# Patient Record
Sex: Male | Born: 1940 | Race: White | Hispanic: No | Smoking: Never smoker
Health system: Southern US, Community
[De-identification: ages and names within clinical notes are randomized; demographics above are authoritative.]

## PROBLEM LIST (undated history)

## (undated) DIAGNOSIS — N182 Chronic kidney disease, stage 2 (mild): Secondary | ICD-10-CM

## (undated) DIAGNOSIS — R972 Elevated prostate specific antigen [PSA]: Secondary | ICD-10-CM

## (undated) DIAGNOSIS — J302 Other seasonal allergic rhinitis: Secondary | ICD-10-CM

## (undated) DIAGNOSIS — F329 Major depressive disorder, single episode, unspecified: Secondary | ICD-10-CM

## (undated) DIAGNOSIS — E785 Hyperlipidemia, unspecified: Secondary | ICD-10-CM

## (undated) DIAGNOSIS — I10 Essential (primary) hypertension: Secondary | ICD-10-CM

## (undated) DIAGNOSIS — I639 Cerebral infarction, unspecified: Secondary | ICD-10-CM

## (undated) DIAGNOSIS — K219 Gastro-esophageal reflux disease without esophagitis: Secondary | ICD-10-CM

## (undated) HISTORY — PX: NASAL SEPTUM SURGERY: SHX37

## (undated) HISTORY — DX: Cerebral infarction, unspecified: I63.9

## (undated) HISTORY — DX: Elevated prostate specific antigen (PSA): R97.20

## (undated) HISTORY — DX: Other seasonal allergic rhinitis: J30.2

## (undated) HISTORY — DX: Gastro-esophageal reflux disease without esophagitis: K21.9

## (undated) HISTORY — DX: Major depressive disorder, single episode, unspecified: F32.9

## (undated) HISTORY — DX: Chronic kidney disease, stage 2 (mild): N18.2

## (undated) HISTORY — DX: Hyperlipidemia, unspecified: E78.5

## (undated) HISTORY — PX: VASECTOMY: SHX75

## (undated) HISTORY — DX: Essential (primary) hypertension: I10

---

## 2001-03-27 ENCOUNTER — Ambulatory Visit (HOSPITAL_COMMUNITY): Admission: RE | Admit: 2001-03-27 | Discharge: 2001-03-27 | Payer: Self-pay | Admitting: Gastroenterology

## 2004-03-26 ENCOUNTER — Inpatient Hospital Stay (HOSPITAL_COMMUNITY): Admission: EM | Admit: 2004-03-26 | Discharge: 2004-04-10 | Payer: Self-pay | Admitting: Emergency Medicine

## 2004-03-26 ENCOUNTER — Encounter (INDEPENDENT_AMBULATORY_CARE_PROVIDER_SITE_OTHER): Payer: Self-pay | Admitting: Cardiology

## 2004-04-10 ENCOUNTER — Inpatient Hospital Stay
Admission: RE | Admit: 2004-04-10 | Discharge: 2004-04-17 | Payer: Self-pay | Admitting: Physical Medicine & Rehabilitation

## 2004-04-17 ENCOUNTER — Inpatient Hospital Stay (HOSPITAL_COMMUNITY)
Admission: RE | Admit: 2004-04-17 | Discharge: 2004-05-07 | Payer: Self-pay | Admitting: Physical Medicine & Rehabilitation

## 2004-05-07 ENCOUNTER — Ambulatory Visit: Payer: Self-pay | Admitting: Physical Medicine & Rehabilitation

## 2004-06-09 ENCOUNTER — Encounter
Admission: RE | Admit: 2004-06-09 | Discharge: 2004-09-07 | Payer: Self-pay | Admitting: Physical Medicine & Rehabilitation

## 2004-06-11 ENCOUNTER — Ambulatory Visit: Payer: Self-pay | Admitting: Physical Medicine & Rehabilitation

## 2004-06-16 ENCOUNTER — Encounter: Admission: RE | Admit: 2004-06-16 | Discharge: 2004-09-14 | Payer: Self-pay | Admitting: Sports Medicine

## 2004-06-24 ENCOUNTER — Ambulatory Visit (HOSPITAL_COMMUNITY): Admission: RE | Admit: 2004-06-24 | Discharge: 2004-06-24 | Payer: Self-pay | Admitting: Neurology

## 2004-09-15 ENCOUNTER — Encounter: Admission: RE | Admit: 2004-09-15 | Discharge: 2004-10-29 | Payer: Self-pay | Admitting: Sports Medicine

## 2004-10-15 ENCOUNTER — Ambulatory Visit: Payer: Self-pay | Admitting: Physical Medicine & Rehabilitation

## 2004-10-15 ENCOUNTER — Encounter
Admission: RE | Admit: 2004-10-15 | Discharge: 2005-01-13 | Payer: Self-pay | Admitting: Physical Medicine & Rehabilitation

## 2010-04-03 ENCOUNTER — Encounter
Admission: RE | Admit: 2010-04-03 | Discharge: 2010-06-01 | Payer: Self-pay | Source: Home / Self Care | Admitting: Internal Medicine

## 2010-09-27 ENCOUNTER — Encounter: Payer: Self-pay | Admitting: Physical Medicine & Rehabilitation

## 2010-09-27 ENCOUNTER — Encounter: Payer: Self-pay | Admitting: Neurology

## 2011-01-22 NOTE — Assessment & Plan Note (Signed)
DATE OF EVALUATION:  October 19, 2004.   MEDICAL RECORD NUMBER:  [id number].   DATE OF BIRTH:  September 20, 1940.   Mr. Grieder returns to the clinic today for followup evaluation.  His wife  reports that all outpatient physical therapy is due to stop next week.  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  He does ambulate 200 yards,  but does have occasional loss of balance and mild toe dragging on the right.   The patient is reporting no particular pain problems at this time.  He is  taking no tramadol and his wife is interested in possibly weaning him from  Neurontin.   The patient had his PEG tube removed during the last office visit.  He is  apparently eating well at this point and having no problems with any  consistency of food.  He still has some occasional bleeding from his penis  tip and the patient has been evaluated by Dr. Etta Grandchild, a local urologist.  Apparently there is a possibility of cystoscopy being done in the near  future for the recurrent problem.  He apparently was told he had a slightly  large prostate on manual testing.   MEDICATIONS:  1.  Plavix 75 mg daily.  2.  Foltx one tablet b.i.d.  3.  Neurontin 100 mg t.i.d.  4.  Aspirin 325 mg daily.  5.  Zoloft 100 mg daily.  6.  Tramadol 50 mg p.o. q.i.d. p.r.n.   PHYSICAL EXAMINATION:  A well-appearing, adult male in no acute discomfort.  Blood pressure 135/71 with a pulse of 82, respiratory rate 20 and O2  saturation 98% on room air.  The left upper extremity and left lower  extremity strength was 5/5 throughout.  The right upper extremity strength  was 4+/5 with normal reflexes and normal sensation.  The right lower  extremity strength was generally 4+/5 in hip flexion and knee extension and  3-/5 in ankle dorsiflexion.   IMPRESSION:  1.  Status post  left mid brain ischemic stroke.  2.  Hypertension.  3.  History of neuropathy.  4.  Depression.  5.  Severe dysphagia, improved.  6.  Severe dysarthria, improved.   In the office today, we have asked the patient to wean from the Neurontin  over the next 10-20 days, decreasing to 100 mg twice a day for 10 days and  then decreasing to 100 mg a day for 10 days and then stopping completely.  He is really not using any tramadol at all at this point.  He will continue  on the Plavix, Foltx, aspirin and Zoloft  medications as noted above.  Will plan on seeing the patient in followup in  approximately two months when we will probably discharge him to his home.      DC/MedQ  D:  10/19/2004 12:20:47  T:  10/19/2004 13:24:05  Job #:  [id number]

## 2011-01-22 NOTE — Consult Note (Signed)
Dennis, Graham NO.:  [id number]   MEDICAL RECORD NO.:  [id number]                   PATIENT TYPE:  INP   LOCATION:  3110                                 FACILITY:  [facility]   PHYSICIAN:  Bernette Redbird, M.D.                DATE OF BIRTH:  Apr 14, 1941   DATE OF CONSULTATION:  04/06/2004  DATE OF DISCHARGE:                                   CONSULTATION   The stroke service asked [place] to see this 70 year old gentleman for possible  PEG placement.   The patient was admitted to the hospital about 10 days ago with a CVA which  has left him with significant dysphagia and a fair amount of drowsiness.  Speech therapy assessment has recommended enteral nutritional support.   PAST MEDICAL HISTORY:  No known allergies.   Current medications include Lovenox, aspirin, and Cipro.   Operations:  Knee arthroscopy.   Medical illnesses:  Hypertension.  No cardiopulmonary disease or diabetes.   HABITS:  Nonsmoker, nondrinker.   FAMILY HISTORY:  Not obtained.   SOCIAL HISTORY:  Had been vigorously active, including riding jet skis  recently.  He works in Warden/ranger.   REVIEW OF SYSTEMS:  Not obtainable.   PHYSICAL EXAMINATION:  GENERAL:  A well-developed Caucasian male, asleep in  bed in no acute distress.  CHEST:  Clear except for some snoring noises.  CARDIAC:  The heart is normal.  ABDOMEN:  Without organomegaly, guarding, mass, tenderness, or surgical  scars or any obvious herniation.   LABORATORY DATA:  Hemoglobin 14.0, platelets 399,000, white count 8300.  Chemistry panel pertinent for mild elevation of transaminases and normal  renal function.  The transaminases were normal at the time of admission.  Albumin 2.8.   IMPRESSION:  I think the patient is an appropriate percutaneous endoscopic  gastrostomy candidate.  The nature, purpose, risks, and alternatives of the  procedure were reviewed in detail with the patient's wife, [name],  who is  agreeable to proceed.  I feel that the procedure could be done either  radiographically or endoscopically, but there is no specific impediment to  endoscopic methodology in this patient and that is the wife's first  preference, although she is aware that sometimes we are not successful in  being able to accomplish percutaneous endoscopic gastrostomy  placement.  It will be scheduled in the near future, but in the meantime the  patient continues to receive tube feeding, which he is apparently tolerating  well.   We will attempt to arrange for PEG placement in the next few days.                                               Bernette Redbird, M.D.    RB/MEDQ  D:  04/06/2004  T:  04/07/2004  Job:  [id number]   cc:   [name], M.D.  [address]  [place]  [place] [address]  Fax: [phone]   Pramod P. Pearlean Brownie, MD  Fax: [phone]

## 2011-01-22 NOTE — Assessment & Plan Note (Signed)
MEDICAL RECORD NUMBER:  [id number].   [name] returns to clinic today accompanied by his wife. He is a 70-year-  old left-handed adult male with history of hypertension. He was admitted  March 26, 2004 with dysarthria and double vision. Also had right sided  weakness. He was diagnosed with an acute infarct in the mid brain extending  to the left thalamus bilaterally, left greater than right.   The patient underwent echocardiogram which showed left ventricular systolic  function to be within normal limits with an ejection fraction of 55 to 65%.  Carotid Doppler studies showed no significant internal carotid stenosis. The  patient was stabilization and was moved to the subacute unit April 10, 2004.  He subsequently was moved to the rehabilitation unit April 17, 2004 and  remained there through discharge May 07, 2004.   Since discharge, the patient has been living at home with his wife and  receiving home health physical, occupational therapy, and speech therapy.  According to his wife, he can walk short distances with her, but she has a  problem with her health condition, and that limits her ability to support  him. With other family members, he is able to walk a further distance. He  still has poor balance but does use a quad cane at home when he does walk  longer distances. His wife does help him with bathing and dressing at the  present time, and his son gets him into a shower 3 times a week.   In terms of his swallow, he still is not eating by mouth. He gets 2 cans of  Jevity q.i.d. at the present time. His speech therapist feels that he  probably he is ready for a modified barium swallow in the near future.   The patient has been back to see Dr. Vickey Huger, his neurologist. She has had  no other recommendations at the present time. He is also followed up with  his primary care physician, [name]. [name] started him back on  Zoloft. He also was started back on lisinopril  but apparently had complaints  of dizziness, and that medication was subsequently discontinued.   MEDICATIONS:  1.  Plavix 75 mg q.d.  2.  Foltx 1 tablet q.d.  3.  Neurontin 100 mg t.i.d.  4.  Aspirin 325 mg q.d.  5.  Zoloft 50 mg q.d.  6.  Tramadol 50 mg q.i.d. p.r.n.   PHYSICAL EXAMINATION:  Reasonably well appearing adult male seated in a  manual wheelchair. Blood pressure 140/91 with a pulse of 78, respiratory  rate 20, and O2 saturation 98% on room air. Left sided strength was 5-/5  throughout. Right upper extremity strength was generally 3- to 3/5. Right  lower extremity strength was generally 4+/5 in hip flexion and knee  extension.   During ambulation, the patient required min to mod assistance with  occasional loss of balance to his right. He was at risk for falls without  assistance. We did not have him use a quad cane in the office today.   IMPRESSION:  1.  Status post left mid brain ischemia stroke.  2.  Hypertension.  3.  History of neuropathy.  4.  Depression.  5.  Severe dysphagia.  6.  Severe dysarthria.   At the present time, the patient's swallowing and speech abilities are  somewhat improved although his mobility is much improved. He has progressed  now from home health and will be starting outpatient therapies in the next  week or so. I have given them a slip for an outpatient modified barium  swallow to be completed with the speech therapist at [facility] over the  next couple of weeks. He will be attending outpatient therapy and should  make excellent progress. I would anticipate that he would be eventually able  to go without the wheelchair for mobility, at least short distances such as  those necessary to get around in the home.   I will plan on seeing the patient in followup in this approximately 2  months' time.       DC/MedQ  D:  06/11/2004 11:25:20  T:  06/11/2004 13:08:11  Job #:  [id number]

## 2011-01-22 NOTE — H&P (Signed)
[name], [name] NO.:  [id number]   MEDICAL RECORD NO.:  [id number]                   PATIENT TYPE:  EMS   LOCATION:  MAJO                                 FACILITY:  [facility]   PHYSICIAN:  Gustavus Messing. Orlin Hilding, M.D.          DATE OF BIRTH:  27-Apr-1941   DATE OF ADMISSION:  03/25/2004  DATE OF DISCHARGE:                                HISTORY & PHYSICAL   CHIEF COMPLAINT:  Double vision.   HISTORY OF PRESENT ILLNESS:  [name] is a 70 year old, left handed, white  male with a history of hypertension.  Today he had sudden onset of double  vision, dizziness, slurred speech, and staggering.  His wife called EMS, and  he was transported to the emergency room.  By the time he arrived here and  was evaluated, his symptoms had largely abated, except for some mild  residual dysarthria, dizziness, and pupil asymmetry.  He has had no similar  symptoms in the past.  He did have some pain in his left arm yesterday and  has had some left foot pain secondary to an injury about a week ago.  No  headache, chest pain, shortness of breath, no weakness.   PAST MEDICAL HISTORY:  1. Hypertension.  No diabetes, coronary artery disease, or cholesterol     problems.  2. He has had arthroscopic surgery on a knee.   MEDICATIONS:  Lisinopril and Motrin.   ALLERGIES:  No known drug allergies.   SOCIAL HISTORY:  No cigarette or alcohol use.  He is married, works in Tour manager.   FAMILY HISTORY:  Positive for cancer.   PHYSICAL EXAMINATION:  VITAL SIGNS:  Temperature is 98.4, pulse 80,  respirations 22, 99% sat, blood pressure 161/92.  HEENT:  Head is normocephalic, atraumatic.  NECK:  Supple without bruits.  HEART:  Regular rate and rhythm.  LUNGS:  Clear to auscultation.  ABDOMEN:  Benign.  EXTREMITIES:  Without edema.  Extensive left foot ecchymosis.  NEUROLOGIC:  Mental status:  He is awake, alert, and oriented with normal  language and cognition.   Cranial nerves:  His pupillary asymmetry, his left  pupil is large at about 5 to 6 but does sluggishly react.  Right is smaller  at 2 to 3 and does sluggishly react.  Disk margins are sharp.  Visual fields  are full.  Extraocular movements are intact, except for impaired down gaze  and very slight nystagmus, maybe some slight left ptosis.  Facial sensation:  Normal facial motor.  __________  normal.  Hearing is intact.  Palate is  symmetric and tongue is midline, but he has got a mild dysarthric quality to  his voice.  Motor:  He has got normal bulk, tone, and strength throughout.  No drift, no satellite.  Normal rapid fine movements.  Deep tendon reflexes  are 1+ to 2+.  He seems to have an upgoing toe  on the left, down on the  right.  Coordination:  Minimal dysmetria right finger-to-nose, otherwise  left finger-to-nose and bilateral heel-to-shin normal.  Sensory is normal.   CT of the brain is unremarkable.   IMPRESSION:  Suspect brain stem, possibly mid brain level, infarct.   PLAN:  Admit for MRI of the brain, MR angiogram, echo, carotid and  transcranial Dopplers.  Start him on aspirin.  Further workup to be  determined.                                                Catherine A. Orlin Hilding, M.D.    CAW/MEDQ  D:  03/26/2004  T:  03/26/2004  Job:  [id number]

## 2011-01-22 NOTE — Assessment & Plan Note (Signed)
[name] returns to the clinic today accompanied by his wife.  He is a 70-  year-old left handed male with a history of hypertension.  He was admitted  on the rehabilitation unit, April 17, 2004, and remained there through  discharge May 07, 2004.  At the present time the patient is receiving  outpatient physical and occupational therapy.  He has been discharged from  speech therapy.  He has been eating a regular diet for the past 3+ to 4  weeks.  His wife does flush his PEG tube which was placed, while he was  hospitalized, by Dr. Matthias Hughs.  The wife does report that the patient has had  some crying spells and occasionally is somewhat enraged.  She is wondering  if maybe the Zoloft medication can be increased.  The patient continues to  see his neurologist, Dr. Vickey Huger.  He continues to use Plavix and aspirin  for stroke prophylaxis.   MEDICATIONS:  1.  Plavix 75 mg every day.  2.  Foltx one tablet every day.  3.  Neurontin 100 mg t.i.d.  4.  Aspirin 325 mg every day.  5.  Zoloft 50 mg every day.  6.  Tramadol 50 mg q.i.d. p.r.n.   PHYSICAL EXAMINATION:  GENERAL:  A well-appearing adult male in no acute  discomfort.  VITAL SIGNS:  Blood pressure 135/79 with a pulse of 65, respiratory rate 16,  and O2 saturation 98% on room air.  GASTROINTESTINAL:  He does have a PEG tube in place and wishes that to be  removed today.  MUSCULOSKELETAL:  He has strength of 5-/5 throughout the left upper and left  lower extremities.  Right arm strength was 3+ to 4 over 5 and right leg  strength was 4/5.  The patient was able to stand with contact guard to  standby assistance.   IMPRESSION:  1.  Status post left mid brain ischemic stroke.  2.  Hypertension.  3.  History of neuropathy.  4.  Depression.  5.  Severe dysphagia, improved.  6.  Severe dysarthria, improved.   In the clinic today,  1.  The patient did wish to have his PEG tube removed.  He is doing very      well on a regular  diet at this time on thin liquids.  We did remove the      PEG tube in the office today without complications.  2.  We have also refilled his Zoloft 100 mg every day, increased from 50 mg      every day.  3.  We will plan on seeing the patient in followup in approximately two      months' time.  4.  He will continue in outpatient therapies for at least three more visits.      He is unsure if his insurance will cover him past that as he has      recently had to start COBRA and switching from [employer].  He      had been on [employer] when his employer continued to cover him      for health insurance.       DC/MedQ  D:  08/19/2004 14:32:22  T:  08/19/2004 15:02:54  Job #:  [id number]

## 2011-01-22 NOTE — Discharge Summary (Signed)
[name], [name] NO.:  [id number]   MEDICAL RECORD NO.:  [id number]                   PATIENT TYPE:  ORB   LOCATION:  4505                                 FACILITY:  [facility]   PHYSICIAN:  Ellwood Dense, M.D.                DATE OF BIRTH:  [birth year]   DATE OF ADMISSION:  04/10/2004  DATE OF DISCHARGE:  04/17/2004                                 DISCHARGE SUMMARY   DISCHARGE DIAGNOSES:  1. Mid brain infarction extending into the thalamic.  2. Severe dysarthria secondary to above.  3. Elevated homocystine.  4. Status post urosepsis.  5. Urinary retention.  6. Hematuria.  7. History of hypertension.  8. Severe dysphagia status post PEG.   HISTORY OF PRESENT ILLNESS:  The patient is a 70 year old, left handed, with  history of hypertension admitted on July 21 with dysarthria and double  vision.  Head CT was negative.  MRI revealed acute infarction mid brain and  left side extending into the thalamus, bilaterally left greater than right.  Carotid Dopplers revealed no significant stenosis.  Echocardiogram revealed  left ventricular systolic function normal.  Ejection fraction 55-65%.  The  patient had PEG placed on April 08, 2004, but Dr. Matthias Hughs secondary to sever  dysphagia.   HOSPITAL COURSE:  Admitted for urosepsis, and placed on IV Cipro until  April 12, 2004.  Urinary trauma secondary to hematuria.  Foley placed by  urologist.  Hypotension.   PT report got patient minimal assistance for bed mobility, moderate assist  for transfer, ambulating 5 feet with hand held assistance to total  assistance.   REVIEW OF SYSTEMS:  Weakness, urinary retention, dysphagia.  Has  hypertension.  Denies any diabetes, cardiovascular disease, fifth digit toe  fracture.   PAST SURGICAL HISTORY:  Right knee surgery.   SOCIAL HISTORY:  [place] level home.  Prior to admission employed as a Music therapist.  Wife is LPN and works at least twice a week.  No steps to entry.  Two adult  children.  Works, they have their own family, minimal assistance from  children.  Denies tobacco.  Occasional alcohol.  Was very active prior to  admission.   MEDICATIONS:  Lisinopril 200 mg daily.   ALLERGIES:  None.   HOSPITAL COURSE:  [name] was admitted to [name] subacute  department on April 10, 2004, where he received more than an hour of  therapies daily.  [name] progressed very well through a physical  standpoint but still has significant dysarthria and dysphagia.  He remained  on aspirin, Plavix for DVT prophylaxis.  Due to significant improvement in  his day #2 of therapies on subcu, it was felt the patient would benefit from  higher level therapies, and this would be CIR level.  Therefore, the patient  was transferred  on CIR for higher level therapies on April 17, 2004.  The  patient, prior to transfer, had NBI's on April 13, 2004.  Unfortunately, the  patient did not pass and remained n.p.o.  The patient also received Diflucan  100 mg daily for possible oral thrush.  There were no other major issues  that occurred while patient was on subacute.  The patient was transferred to  [facility] for higher level therapy.      [name], P.A.                         Ellwood Dense, M.D.    LB/MEDQ  D:  04/21/2004  T:  04/22/2004  Job:  [id number]

## 2011-01-22 NOTE — Procedures (Signed)
PROCEDURE:  EEG   REFERRED BY:  Melvyn Novas, M.D.   A 70 year old with recent stroke. I am not clear on the reason for the  study.   This was a routine 17 channel EEG with one channel of EKG utilizing  international 10-20 lead placement system. The patient was described  clinically as being lethargic although electrographically he appeared to be  awake and drowsy. Well awake, the background consisted of a well organized,  well-developed, well modulated __________  activity which is __________  in  the posterior head regions and reacted to eye opening.  There is a period of  attenuation with decrease in the frequency and amplitude in the background  consistent with drowsiness and occasional ventral sharp activity and K  complexes.  There was one episode of higher frequency waves for about 2  seconds which was following some body movement with artifact which suggested  this is artifactual.  There was no postictal slowing of the back or anything  to suggest that this was an epileptiform discharge. No clear  interhemispheric asymmetry is identified and no definite epileptiform  discharges are seen.  The EKG monitor reveals relatively regular rhythm with  a rate of 66 to 72 beats per minute.   CONCLUSION:  Essentially normal EEG in the waking and drowsy states as  described. There is some higher amplitude, lower frequency activity which  appears to be artifactual.  Clinical correlation is recommended.    Tyler Deis, M.D.   ZOX:WRUE  D:  03/31/2004 11:02:59  T:  03/31/2004 11:30:03  Job #:  [id number]

## 2011-01-22 NOTE — Discharge Summary (Signed)
[name], [name] NO.:  [id number]   MEDICAL RECORD NO.:  [id number]                   PATIENT TYPE:  IPS   LOCATION:  4039                                 FACILITY:  [facility]   PHYSICIAN:  Ellwood Dense, M.D.                DATE OF BIRTH:  November 29, 1940   DATE OF ADMISSION:  04/17/2004  DATE OF DISCHARGE:  05/07/2004                                 DISCHARGE SUMMARY   DISCHARGE DIAGNOSES:  1.  Left midbrain cerebrovascular accident.  2.  Neuropathy, improved.  3.  Hypertension.   HISTORY OF PRESENT ILLNESS:  The patient is a 70 year old left-handed male  with history of hypertension admitted July 21 with dysarthria and double  vision.  He was diagnosed with acute infarct in midbrain extending into left  thalamus bilaterally, left greater than right.  The patient's carotid  Dopplers done showed no significant ICA stenosis.  Cardiac echo showed left  ventricular systolic function to be within normal limits with the ejection  fraction at 55-65%.  He was noted to have severe dysphagia requiring PEG  placement on August 3.  The patient was transferred to [facility] on April 10, 2004, for lower-level therapies.  The patient was able to participate in  therapy and was making good progress.  He progressed to moderate to maximum  assist bed mobility, minimum assist for transfers, was able to ambulate with  total assist +2.  He was transferred to [name] program August 12 for further  progressive therapies.   PAST MEDICAL HISTORY:  1.  Hypertension.  2.  Right knee surgery.  3.  Coronary artery disease.  4.  History of prostatitis and BPH.   HOSPITAL COURSE:  [name] was admitted to rehab on April 17, 2004,  for inpatient therapies to consist of PT/OT daily.  The patient was noted to  have problems with expressive aphasia with verbal output being minimal.  The  patient was NPO throughout his stay secondary to severe dysphagia.  An MBS  was repeated.  It  was done August 22, showing the patient with severe  dysphagia with penetration of purees and aspiration of nectar.  The patient  continued on NPO.  The patient remained NPO at time of discharge, provided  speech therapy to follow past discharge, at which time repeat MBS will be  done according to progress.  Labs done past admission showed hemoglobin  13.7, hematocrit 40.1, white count 6.1, platelets 416.  Check of  electrolytes showed sodium 141, potassium 4.8, chloride 105, CO2 20, BUN 19,  creatinine 1.2, glucose 88.  Urine UCS done showed multispecies.  The  patient is continent for 75-95% of the time with toileting.  He is able to  tolerate 420 mL tube feeds q.i.d. basis without difficulty.  Initially the  patient was noted to have some bladder spasm secondary to Foley.  Once this  was  discontinued, symptoms resolved.  He did report some dysesthesias and  neuropathy in the lower extremity and was started on Neurontin with  improvement of symptomatology.  The patient made good progress during his  stay.  He does continue with decreased vision with minimal reaction of left  pupil, vertical eye movement, he has decrease in vertical eye movement.  Horizontal eye movement is within functional limits.  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  During his stay in rehab the patient progressed to being at  supervision level for bed mobility, is minimum to moderate assist for sit-  stand transfers and stand-pivot transfers.  He is max assist, ambulating 60  feet with a rolling walker, able to navigate wheelchair 120-175 feet at  supervision level.  He continues to push to the right in stance and tends to  lean back with weight shifting backwards, which puts him at risk for a fall.  Sitting down is stable at edge of bed with upper extremities supported.  According to speech therapy, the patient is basic comprehension and is at  100% accuracy.  He will follow three-step commands at 100% accuracy,  will  understand paragraph at 75% accuracy with short questions.  Reading was not  tested secondary to his impaired vision.  He is independent for automatic  speech and repetition.  He is able to independently name basic items.  He is  at minimum to moderate assist to express basic needs and initiate  communication.  Oral expression is limited as the patient does not converse.  He speaks primarily in single word, occasional sentence.  He shows some  __________ and dysfunctional disfluence repetition with initial phonation of  whole words.  Speech is 50% accuracy for words, 40% for sentences.  He  continues with severe dysarthria.  He requires minimum assist to upper body  care, supervision for upper body dressing.  He is at minimum assist for  lower body care.  Further follow-Dennis therapies to continue by [facility] for feeding tube, PT/OT, speech therapy.  Of note, on day of  discharge the patient was being toileted by wife.  He reportedly stood  independently and was noted to have a fall.  Neurologically no changes  noted.  The patient remains no injuries noted except for a small superficial  hematoma on right forehead and right biceps area.  Wife and patient have  been advised regarding risk for falls, especially with aspirin and Plavix on  board.  They also were advised to monitor the new day or two for any new  neurologic changes, any weakness, any confusion.  They are to call if this  occurred.   DISCHARGE MEDICATIONS:  1.  Plavix 75 mg a day.  2.  Foltx one per day.  3.  Aspirin 325 mg a day.  4.  Ritalin 10 mg at 7 a.m. and noon x3 days, then one per day until gone.  5.  Prevacid Elixir 30 mL, 30 mg per day.  6.  Neurontin 100 mg t.i.d.  7.  Ultram 50 mg q.i.d. p.r.n.   ACTIVITY:  Will require 24-hour assistance.   DIET:  NPO.  Jevity 420 mL at 8, noon, 4, and 10.   WOUND CARE:  Keep PEG site clean and dry.  SPECIAL INSTRUCTIONS:  No alcohol, no  smoking.   FOLLOW-Dennis:  Patient to follow Dennis with Dr. Thomasena Edis June 11, 2004, at 11  a.m., for 11:20 appointment.  Follow Dennis with [name] in  the next two  weeks.  Follow Dennis with Dr. Karleen Hampshire for eye exam in the next one to two  months as speech output improves.  Follow Dennis with Dr. Vickey Huger in one month.      Greg Cutter, P.A.                    Ellwood Dense, M.D.    PP/MEDQ  D:  05/07/2004  T:  05/09/2004  Job:  [id number]   cc:   Melvyn Novas, M.D.  [address]  [address]  [place]  [place] [address]  Fax: [phone]   [name], M.D.  [address]  [place]  [place] [address]  Fax: [phone]   Tyrone Apple. Karleen Hampshire, M.D.  [address]  [place]  [place] [address]  Fax: [phone]

## 2011-01-22 NOTE — Discharge Summary (Signed)
[name], [name]                ACCOUNT NO.:  [id number]   MEDICAL RECORD NO.:  [id number]          PATIENT TYPE:  INP   LOCATION:  3031                         FACILITY:  [facility]   PHYSICIAN:  Pramod P. Pearlean Brownie, MD    DATE OF BIRTH:  [birth year]   DATE OF ADMISSION:  03/26/2004  DATE OF DISCHARGE:  04/10/2004                                 DISCHARGE SUMMARY   ADMISSION DIAGNOSIS:  Stroke.   DISCHARGE DIAGNOSES:  1.  Bi-thalamic and mid-brain infarction secondary to artery of [place]      occlusion secondary to intracranial arteriosclerosis.  2.  Dysphagia secondary to stroke, status post percutaneous endoscopic      gastrostomy tube insertion on April 08, 2004.  3.  Urinary tract infection.  4.  Transient hypotension secondary to above.   HOSPITAL COURSE:  [name] was a 70 year old male who was admitted on March 26, 2004 with symptoms of sudden onset of slurred speech with diplopia and  gait ataxia.  He presented within 1 hour from onset of his symptoms, but  noted significant improvement upon arrival in the emergency room.  Hence, he  was not considered a candidate for IV thrombolysis.  He was admitted to the  stroke unit, however, he subsequently showed worsening after admission and  developed bilateral ptosis as well as dysarthria, dysphagia and increased  right-sided weakness.  Admission CT scan was unremarkable.  MRI scan of the  brain showed acute infarction involving both medial thalami and upper mid-  brain.  MRA of the brain showed some long-segment irregular narrowing of the  distal right vertebral artery with question of occlusion versus dissection.  The patient had stated that he had done a reverse flip dive into a swimming  pool 2 weeks ago, but he has been doing this for years and there was no  immediate neck trauma or injury at that time.  The patient was initially  started on Aggrenox for stroke prevention, but since his symptoms got worse,  he was switched to  aspirin and Plavix.  The patient was found to be drowsy,  but easily opening eyes and following commands.  His bilateral ptosis made  him look sleepier than he actually was.  He had significant right  hemiparesis, upper extremity 2/5, compared to lower extremity, which was 3-  4/5.  He had complete up- and down-gaze paralysis.  He was able to look  horizontally minimally to the right and practically not to any degree to the  left.  The patient had trauma during Foley catheter insertion which led to  hematuria; as a result of that, he was seen on consultation by urologist and  treated for the same.  The patient was seen by speech pathology and found to  have significant dysphagia from his stroke and hence, he was initially fed  with a Panda tube and subsequently had a PEG tube placed on April 08, 2004  for the same.  He developed a sudden onset of worsening on April 02, 2004 at  about midnight, when he was found to be transiently hypotensive  and his SATS  were slow.  He was thought to have sepsis from urinary tract infection and  was treated with IV fluids and Zosyn.  Subsequently, urine cultures grew  greater than 100,000 gram-positive rods which were sensitive to Cipro and  hence he was changed to ciprofloxacin.  He made gradual improvement and was  seen in consultation by physical and occupational therapy, and was found to  be able to ambulate with some assistance.  He was found to be an appropriate  candidate for subacute rehabilitation and hence he was [facility]  on April 10, 2004.   FOLLOWUP:  He was advised to follow up with his primary physician in the  future as needed and with Dr. Sunny Schlein. Sethi in his office in 2 months.       PPS/MEDQ  D:  06/04/2004  T:  06/04/2004  Job:  [id number]

## 2011-01-22 NOTE — Procedures (Signed)
[facility]  Patient:    WILMAR, PRABHAKAR                         MRN: [id number] Proc. Date: 03/27/01 Attending:  Verlin Grills, M.D. CC:         Tyson Dense, M.D.   Procedure Report  PROCEDURE:  Colonoscopy.  REFERRING PHYSICIAN:  Tyson Dense, M.D.  INDICATION FOR PROCEDURE:  Mr. Harold Moncus (DOB: [birth year]) is a 70 year old male who is due for surveillance colonoscopy and polypectomy to prevent colon cancer. Unfortunately, Mr. Brimage was able the consume only 50% of his colonic lavage prep before vomiting developed. Colonic preparation for a comprehensive colonoscopy was poor.  ENDOSCOPIST:  Verlin Grills, M.D.  PREMEDICATION:  Versed 10 mg, Demerol 50 mg.  ENDOSCOPE:  Olympus pediatric colonoscope.  DESCRIPTION OF PROCEDURE:  After obtaining informed consent, the patient was placed in the left lateral decubitus position. I administered intravenous Demerol and intravenous Versed to achieve conscious sedation for the procedure. The patients cardiac rhythm, oxygen saturation and blood pressure were monitored throughout the procedure and documented in the medical record.  Anal inspection was normal. Digital rectal exam was normal. The Olympus pediatric video colonoscope was introduced into the rectum and advanced to the cecum. A normal appearing ileocecal valve was intubated and the distal ileum inspected. Colonic preparation for the exam today was poor.  RECTUM:  Normal.  SIGMOID COLON/DESCENDING COLON:  Normal.  SPLENIC FLEXURE:  Normal.  TRANSVERSE COLON:  Normal.  HEPATIC FLEXURE:  Normal.  ASCENDING COLON:  Normal.  CECUM/ILEOCECAL VALVE:  Normal.  DISTAL ILEUM:  Normal.  ASSESSMENT: 1. Poor colonic prep for colorectal neoplasia screening. 2. Normal proctocolonoscopy to the cecum with intubation of the ileocecal    valve and distal ileum inspection. Small colonic polyps could easily have    been missed  due to the poor colonic prep. DD:  03/27/01 TD:  03/27/01 Job: [id number]

## 2011-01-22 NOTE — Consult Note (Signed)
[name], [name] NO.:  [id number]   MEDICAL RECORD NO.:  [id number]                   PATIENT TYPE:  INP   LOCATION:  3009                                 FACILITY:  [facility]   PHYSICIAN:  Jamison Neighbor, M.D.               DATE OF BIRTH:  Oct 09, 1940   DATE OF CONSULTATION:  03/31/2004  DATE OF DISCHARGE:                                   CONSULTATION   REFERRING PHYSICIAN:  Gustavus Messing. Orlin Hilding, M.D.   REASON FOR CONSULTATION:  Foley catheter trauma with gross hematuria per  meatus.   HISTORY OF PRESENT ILLNESS:  This 70 year old male was admitted to the  hospital on March 26, 2004.  The patient presented because of the sudden  onset of double vision, dizziness, slurred speech, and staggering.  He has  been on the stroke service ever since.  The patient has a Foley catheter in  place.   The patient's wife relates a story to me, noting that the patient began to  have problems with the catheter and having a lot of pain.  There were  apparently spasms and some blood was noted.  The catheter was removed and  attempts at passing a new catheter were unsuccessful.  The urologic consult  was called for management of the gross bleeding per meatus.   The patient's past urologic history is unremarkable according to his wife,  he was known to have mild prostatic enlargement.  He was not on regular  medications.  He had a slight change in the force of the stream, but no  problems with hematuria, urinary tract infections, prostate infections, or  episodes of retention.   SOCIAL HISTORY:  Unremarkable, he does not use tobacco or alcohol.  He is  long-time married and his wife used to work as a Engineer, civil (consulting) for the [employer] for [employer], M.D.   MEDICATIONS:  At the time of admission, the patient's only medications were  lisinopril and Motrin.   CURRENT MEDICATIONS:  1. Lisinopril.  2. Aspirin.  3. Lovenox which was just discontinued.  4.  Plavix.  5. Reglan.   ALLERGIES:  No known drug allergies.   FAMILY HISTORY:  Noncontributory.   PHYSICAL EXAMINATION:  GENERAL:  The patient is found in bed.  He has a  Panda feeding tube in place.  He does have blood coming from the meatus  which had to be cleaned away.  The meatus is otherwise normal.  The foreskin  is normal, the testicles are normal, the penis is unremarkable.  ABDOMEN:  Nondistended.  He had no frank mass or tenderness.  RECTAL:  Not performed.  The perineum was normal.  EXTREMITIES:  No cyanosis, clubbing, or edema.   After the area was prepped with Betadine and local infiltration of lidocaine  jelly per meatus was performed, the 24 [place] Coude catheter was passed up  into  the bladder.  30 mL were placed in the balloon and it seated itself  normally within the bladder.  The bladder was irrigated, several clots were  removed, and the outflow ran clear.  The patient did seem more comfortable  after the catheter had been reinserted.   The patient was given lidocaine jelly to help prevent any pain around the  meatus.  He will begin Pyridium to take care of any discomfort associated  with the catheter and B&S suppositories as needed for bladder spasms.  Longterm management of the patient's bladder will need to put on hold until  the hematuria is cleared and urethral trauma has had a chance to recover,  even if the patient is otherwise ready for a voiding trial, the catheter  should not be removed for at least one week to allow the urethra to heal.  When the patient is ambulatory, should that occur, the decision can be made  as to the best way to manage his urinary outflow longterm.                                               Jamison Neighbor, M.D.    RJE/MEDQ  D:  03/31/2004  T:  03/31/2004  Job:  [id number]   cc:   Santina Evans A. Orlin Hilding, M.D.  [address]  [address]  [place]  [place] [address]  Fax: [phone]

## 2011-01-22 NOTE — Op Note (Signed)
Dennis, Graham NO.:  [id number]   MEDICAL RECORD NO.:  [id number]                   PATIENT TYPE:  INP   LOCATION:  3031                                 FACILITY:  [facility]   PHYSICIAN:  Bernette Redbird, M.D.                DATE OF BIRTH:  [birth year]   DATE OF PROCEDURE:  04/08/2004  DATE OF DISCHARGE:                                 OPERATIVE REPORT   PROCEDURE PERFORMED:  Percutaneous endoscopic gastrostomy placement.   INDICATIONS FOR PROCEDURE:  The patient is a 70 year old gentleman who  sustained a stroke about two weeks ago and has been receiving tube feedings  through a Panda tube due to obtundation and dysphagia symptoms.   FINDINGS:  Distal esophagitis.  Uneventful percutaneous endoscopic  gastrostomy placement.   DESCRIPTION OF PROCEDURE:  The nature, purpose and risks of the procedure  had been carefully discussed with the patient's wife who provided written  consent.  He was brought from his hospital room to the endoscopy unit.  Ancef 1 g IV was administered for antibiotic prophylaxis prior to the  procedure.  No topical pharyngeal anesthesia was administered.  Versed 4 mg  was given for sedation without any oxygen desaturation or clinical  instability.   The Olympus small caliber video adult endoscope was passed under direct  vision.  The vocal cords looked grossly normal and the esophagus was entered  without undue difficulty.  The distal esophagus had some mucosal hemorrhage  consistent with probably some erosive esophagitis although exudate was not  identified. I did not see any deep ulcerations or any evidence of a tumor.  No varices or infection were seen and there was no obvious stricture.  There  was a small hiatal hernia present.  The stomach contained no significant  residual and had normal mucosa without evidence of gastritis, erosions,  ulcers, polyps or masses and the pylorus, duodenal bulb and second duodenum  looked  normal.  A suitable site for PEG placement was then identified in the  midabdominal region.  The abdomen had already been prepped with Betadine but  was reprepped and the skin was anesthetized with 1% plain lidocaine. A small  cutaneous incision was made and the 18 gauge Seldinger needle was passed.  About this time, the patient coughed so the needle did not actually puncture  the stomach although we could see the impression of the needle upon the  stomach.  The needle was re-oriented and was unable to enter the stomach on  the next pass.  It was grasped with the polypectomy snare.  The stylet was  removed.  The guidewire was introduced and withdrawn out through the  patient's mouth after which the 24 [place] Wilson Cook feeding tube was slid  along the guidewire and pushed until its leading edge pierced the anterior  abdominal wall with the help of some traction on the wire anteriorly, after  which it was pulled the remainder of the distance with moderate resistance  due to a strong fascial layer.  The patient was re-endoscoped under direct  vision after the external bolster was applied, confirming appropriate  tension on the external bolster and appropriate positioning of the internal  bolster.  There was some fresh hemorrhage in the distal esophagus probably  from friability and irritation from passage of the internal bolster and the  scope, but no evidence of significant active bleeding.   The scope was removed from the patient.  The patient tolerated the procedure  well.  There were no apparent complications.   IMPRESSION:  1. Distal hemorrhagic, presumably erosive esophagitis of mild to moderate     severity.  2. Uneventful placement of a 24 French traction removable PEG tube,     requiring two passes with the needle as described above.   PLAN:  Observe overnight for complications with the intent of starting tube  feedings tomorrow if the patient remains clinically stable.                                                Bernette Redbird, M.D.    RB/MEDQ  D:  04/08/2004  T:  04/08/2004  Job:  [id number]   cc:   [name], M.D.  [address]  [place]  [place] [address]  Fax: [phone]

## 2014-05-30 ENCOUNTER — Encounter: Payer: Self-pay | Admitting: Neurology

## 2014-05-31 ENCOUNTER — Encounter (INDEPENDENT_AMBULATORY_CARE_PROVIDER_SITE_OTHER): Payer: Self-pay

## 2014-05-31 ENCOUNTER — Encounter: Payer: Self-pay | Admitting: Neurology

## 2014-05-31 ENCOUNTER — Ambulatory Visit (INDEPENDENT_AMBULATORY_CARE_PROVIDER_SITE_OTHER): Payer: Medicare Other | Admitting: Neurology

## 2014-05-31 VITALS — BP 132/90 | HR 80 | Resp 16 | Ht 70.0 in | Wt 157.0 lb

## 2014-05-31 DIAGNOSIS — I6350 Cerebral infarction due to unspecified occlusion or stenosis of unspecified cerebral artery: Secondary | ICD-10-CM

## 2014-05-31 DIAGNOSIS — I639 Cerebral infarction, unspecified: Secondary | ICD-10-CM | POA: Insufficient documentation

## 2014-05-31 DIAGNOSIS — R413 Other amnesia: Secondary | ICD-10-CM

## 2014-05-31 DIAGNOSIS — G3184 Mild cognitive impairment, so stated: Secondary | ICD-10-CM | POA: Insufficient documentation

## 2014-05-31 DIAGNOSIS — I69359 Hemiplegia and hemiparesis following cerebral infarction affecting unspecified side: Secondary | ICD-10-CM | POA: Insufficient documentation

## 2014-05-31 DIAGNOSIS — I6322 Cerebral infarction due to unspecified occlusion or stenosis of basilar arteries: Secondary | ICD-10-CM | POA: Insufficient documentation

## 2014-05-31 DIAGNOSIS — I69959 Hemiplegia and hemiparesis following unspecified cerebrovascular disease affecting unspecified side: Secondary | ICD-10-CM

## 2014-05-31 MED ORDER — MEMANTINE HCL 28 X 5 MG & 21 X 10 MG PO TABS: ORAL_TABLET | ORAL | Status: DC

## 2014-05-31 NOTE — Progress Notes (Signed)
Provider:  Keona Sheffler, M D  Referring Provider: Georgann HousekMelvyn Novasmary Care Physician:  [name], MD  Chief Complaint  Patient presents with  . New Evaluation    Room 11  . Memory Loss    HPI:  [name] is a 73 y.o. male  Is seen here after a prolonged time, I used to follow this stroke patient during his hospitalization 10 years ago. His visit today was arranged by his wife , his PCP is [name] ,  Mr. Eich is today referred for a memory evaluation .Because of his history of a complicated CVA ,brain stem stroke syndromes and hemiparesis , a vascular effect has to be taken into account. Mr. Karner father had late onset dementia and personality changes and Mrs. Kijowski sees some parallels in his behavior. He has become more aggressive, easier agitated, which was not part of his personality before this year. The couple celebreated their Renette Butters wedding anniversary in 2014 ,  active in the [employer] . Mr. Gurney performs household chores and the couple spends time with the grandchildren now 44 and 7.   He has become impulsive, and sometimes" downright mean ". He still uses a urinary cath. Has right hand weakness , leg stiffness, left sensory loss, right facial weakness -hold cane in the left hand. Dysphonia, aphasia.    Review of Systems: Out of a complete 14 system review, the patient complains of only the following symptoms, and all other reviewed systems are negative.   History   Social History  . Marital Status: Married    Spouse Name: [name]    Number of Children: 2  . Years of Education: 12   Occupational History  . Not on file.   Social History Main Topics  . Smoking status: Never Smoker   . Smokeless tobacco: Never Used  . Alcohol Use: No  . Drug Use: No  . Sexual Activity: Not on file   Other Topics Concern  . Not on file   Social History Narrative   Patient is married [name]) and lives at home with his wife.   Patient is disabled.     Patient has two adult children and two grandchildren.   Patient has a high school education.   Patient is left-handed.   Patient drinks one cup of coffee daily.             Family History  Problem Relation Age of Onset  . Colon cancer Brother     Past Medical History  Diagnosis Date  . Hypertension     without medication since 2006  . Dyslipidemia   . Major depression   . CVA (cerebral infarction)     Left thalamic, right-sided weakness wih aphagia, right leg brace  . Elevated PSA     biopsy negative in 2008  . GERD (gastroesophageal reflux disease)   . Seasonal rhinitis   . CKD (chronic kidney disease), stage II     History reviewed. No pertinent past surgical history.  Current Outpatient Prescriptions  Medication Sig Dispense Refill  . aspirin 81 MG tablet Take 81 mg by mouth daily.      . DULoxetine (CYMBALTA) 60 MG capsule Take 60 mg by mouth daily.      . simvastatin (ZOCOR) 40 MG tablet Take 40 mg by mouth daily.       No current facility-administered medications for this visit.    Allergies as of 05/31/2014  . (Not on File)  Vitals: BP 132/90  Pulse 80  Resp 16  Ht  (1.778 m)  Wt 157 lb (71.215 kg)  BMI 22.53 kg/m2 Last Weight:  Wt Readings from Last 1 Encounters:  05/31/14 157 lb (71.215 kg)   Last Height:   Ht Readings from Last 1 Encounters:  05/31/14  (1.778 m)    Physical exam:  General: The patient is awake, alert and appears not in acute distress. The patient is well groomed. Head: Normocephalic, atraumatic. Neck is supple. Mallampati 2 neck circumference: 14.5  Cardiovascular:  Regular rate and rhythm , without  murmurs or carotid bruit, and without distended neck veins. Respiratory: Lungs are clear to auscultation. Skin:  Without evidence of edema, or rash Trunk: Neurologic exam : The patient is awake and alert, oriented to place and time.  Memory subjective impaired - MMSE  27-30, MOCA 21 -30,   He is left handed,  and has terrible handwriting - which is not new- he missed all 5 recall words.  There is a normal attention span & concentration ability. Speech  dysarthria, dysphonia and  Aphasia.  Mood and affect are appropriate.  Cranial nerves: Pupils are unequal but briskly reactive to light. He has lost the right peripheral vision, stereo vision is impaired -  He cannot drive , no diplopia.  Hearing to finger rub intact. Facial sensation intact to fine touch. Facial motor strength is symmetric and tongue and uvula move midline. Tongue protrusion into either cheek is normal! Shoulder shrug is normal.    Gait and station: Patient walks with a cane , needs help with getting up, rises with assistance.  Deep tendon reflexes: in the  upper and lower extremities are asymmetric, brisk in the right .  Babinski maneuver response is up going.   Assessment:  After physical and neurologic examination, review of laboratory studies, imaging, neurophysiology testing and pre-existing records, assessment is that of :   Mild cognitive impairment.  Short term memory loss.   He cannot be measured on MOCA due to vision and handwriting impairment.   Plan:  Treatment plan and additional workup :   Melvyn Novas MD 05/31/2014

## 2014-05-31 NOTE — Patient Instructions (Signed)
Vascular Dementia Vascular dementia is a common cause of dementia in the elderly. Dementia is a condition that affects the brain and causes people to not think well or act normally. Vascular dementia is one type of dementia. It occurs when blood clots block small blood vessels in the brain and destroy brain tissue. Likely risk factors are high blood pressure and advanced age. This disease can cause stroke, migraine-like headaches, and psychiatric disturbances.  SYMPTOMS   Confusion.  Problems with recent memory.  Wandering or getting lost in familiar places.  Loss of bladder or bowel control (incontinence).  Unsteady gait.  Poor attention and concentration.  Emotional problems such as laughing or crying inappropriately.  Difficulty following instructions.  Problems handling money.  Depression.  Difficulty planning ahead. Usually the damage is slight at first. Over time, as more small vessels are blocked, there is a gradual mental decline. However, symptoms may begin suddenly. Symptoms may be very similar to Alzheimer's disease. The two forms of dementia may occur together. Vascular dementia typically begins between the ages of 60 and 75. It affects men more often than women. TREATMENT   Currently there is no treatment for vascular dementia that can reverse the damage that has already occurred.  Treatment focuses on prevention of additional brain damage and improvement of symptoms.  It is important to treat the risk factors for vascular dementia, such as keeping blood pressure under control, treating diabetes, lowering cholesterol, and stop smoking. PROGNOSIS   Prognosis for patients is generally poor. Individuals with the disease may improve for short periods of time, then get worse again. Early treatment and management of blood pressure and other risk factors may prevent further worsening of the disorder. Document Released: 08/13/2002 Document Revised: 11/15/2011 Document  Reviewed: 06/27/2009 ExitCare Patient Information 2015 [employer]. This information is not intended to replace advice given to you by your health care provider. Make sure you discuss any questions you have with your health care provider.  

## 2014-06-28 ENCOUNTER — Encounter: Payer: Self-pay | Admitting: Neurology

## 2014-06-28 ENCOUNTER — Ambulatory Visit (INDEPENDENT_AMBULATORY_CARE_PROVIDER_SITE_OTHER): Payer: Medicare Other | Admitting: Neurology

## 2014-06-28 VITALS — BP 140/85 | HR 75 | Temp 98.4°F | Resp 12 | Ht 69.5 in | Wt 156.2 lb

## 2014-06-28 DIAGNOSIS — I6350 Cerebral infarction due to unspecified occlusion or stenosis of unspecified cerebral artery: Secondary | ICD-10-CM

## 2014-06-28 MED ORDER — MEMANTINE HCL ER 28 MG PO CP24: 28.0000 mg | ORAL_CAPSULE | Freq: Every morning | ORAL | Status: DC

## 2014-06-28 NOTE — Progress Notes (Signed)
Provider:  Melvyn Novasarmen  Dorsey Authement, M D  Referring Provider: Georgann HousekeeperHusain, Karrar, MD Primary Care Physician:  Georgann HousekeeperHUSAIN,KARRAR, MD  Chief Complaint  Patient presents with  . RV memory    Rm 10, wife    HPI:  [name] is a 73 y.o. male  Is seen here after a prolonged time, I used to follow this stroke patient during his hospitalization 10 years ago. His visit today was arranged by his wife, his PCP is Dr. Donette LarryHusain,  [name] is today referred for a memory evaluation .Because of his history of a complicated CVA ,brain stem stroke syndromes and hemiparesis , a vascular effect has to be taken into account. [name]'s father had late onset dementia and personality changes and Mrs. [name] sees some parallels in his behavior. He has become more aggressive, easier agitated, which was not part of his personality before this year. The couple celebreated their Dennis ButtersGolden wedding anniversary in 2014 ,  active in the [employer] . [name] performs household chores and the couple spends time with the grandchildren now 118 and 7013.  He has become impulsive, and sometimes" downright mean ". He still uses a urinary cath. Has right hand weakness , leg stiffness, left sensory loss, right facial weakness -hold cane in the left hand. Dysphonia, aphasia.    Interval , 06-27-14, Patient is doing well, has mild cognitive impairment.  Namenda  Working well, no side effect.  Gave voucher for 30 days for free.  He has been more mellow.   [name] feels not under stress and scrutiny as much and he feels at ease.  I will schedule RV 6 month from now and get a CT scan , EEG and possible start Arcicept if MMSE declines ( or Namzaric) .     Review of Systems: Out of a complete 14 system review, the patient complains of only the following symptoms, and all other reviewed systems are negative.  Aphasia, dysarthria,  MMSE 26 points. MOCA not possble, MMSE last 27 -30 points.  Geriatric depression score 2 points.     History   Social History  . Marital Status: Married    Spouse Name: Dennis DandyMary    Number of Children: 2  . Years of Education: 12   Occupational History  . Not on file.   Social History Main Topics  . Smoking status: Never Smoker   . Smokeless tobacco: Never Used  . Alcohol Use: No  . Drug Use: No  . Sexual Activity: Not on file   Other Topics Concern  . Not on file   Social History Narrative   Patient is married [name]([name]) and lives at home with his wife.   Patient is disabled.    Patient has two adult children and two grandchildren.   Patient has a high school education.   Patient is left-handed.   Patient drinks one cup of coffee daily.             Family History  Problem Relation Age of Onset  . Colon cancer Brother     Past Medical History  Diagnosis Date  . Hypertension     without medication since 2006  . Dyslipidemia   . Major depression   . CVA (cerebral infarction)     Left thalamic, right-sided weakness wih aphagia, right leg brace  . Elevated PSA     biopsy negative in 2008  . GERD (gastroesophageal reflux disease)   . Seasonal rhinitis   . CKD (  chronic kidney disease), stage II     History reviewed. No pertinent past surgical history.  Current Outpatient Prescriptions  Medication Sig Dispense Refill  . aspirin 81 MG tablet Take 81 mg by mouth daily.      . DULoxetine (CYMBALTA) 60 MG capsule Take 60 mg by mouth daily.      . memantine (NAMENDA TITRATION PAK) tablet pack 5 mg/day for =1 week; 5 mg twice daily for =1 week; 15 mg/day given in 5 mg and 10 mg separated doses for =1 week; then 10 mg twice daily  49 tablet  12  . simvastatin (ZOCOR) 40 MG tablet Take 40 mg by mouth daily.      Marland Kitchen. FLUARIX QUADRIVALENT 0.5 ML injection        No current facility-administered medications for this visit.    Allergies as of 06/28/2014  . (No Known Allergies)    Vitals: BP 140/85  Pulse 75  Temp(Src) 98.4 F (36.9 C) (Oral)  Resp 12  Ht 5' 9.5"  (1.765 m)  Wt 156 lb 4 oz (70.875 kg)  BMI 22.75 kg/m2 Last Weight:  Wt Readings from Last 1 Encounters:  06/28/14 156 lb 4 oz (70.875 kg)   Last Height:   Ht Readings from Last 1 Encounters:  06/28/14 5' 9.5" (1.765 m)    Physical exam:  General: The patient is awake, alert and appears not in acute distress. The patient is well groomed. Head: Normocephalic, atraumatic. Neck is supple. Mallampati 2 neck circumference: 14.5  Cardiovascular:  Regular rate and rhythm , without  murmurs or carotid bruit, and without distended neck veins. Respiratory: Lungs are clear to auscultation. Skin:  Without evidence of edema, or rash Trunk: Neurologic exam : The patient is awake and alert, oriented to place and time.  Memory subjective impaired - MMSE  27-30, MOCA 21 -30,   He is left handed, and has terrible handwriting - which is not new- he missed all 5 recall words.  There is a normal attention span & concentration ability. Speech  dysarthria, dysphonia and  Aphasia.  Mood and affect are appropriate.  Cranial nerves: Pupils are unequal but briskly reactive to light. He has lost the right peripheral vision, stereo vision is impaired -  He cannot drive , no diplopia.  Hearing to finger rub intact. Facial sensation intact to fine touch. Facial motor strength is symmetric and tongue and uvula move midline. Tongue protrusion into either cheek is normal! Shoulder shrug is normal.    Gait and station: Patient walks with a cane , needs help with getting up, rises with assistance.  Deep tendon reflexes: in the  upper and lower extremities are asymmetric, brisk in the right .  Babinski maneuver response is up going.   Assessment:  After physical and neurologic examination, review of laboratory studies, imaging, neurophysiology testing and pre-existing records, assessment is that of :   Mild cognitive impairment.  Short term memory loss.   He cannot be measured on MOCA due to vision and  handwriting impairment.   Plan:  Treatment plan and additional workup :   Melvyn Novasarmen Domani Bakos MD 06/28/2014

## 2014-06-28 NOTE — Patient Instructions (Signed)
Memantine extended release capsules What is this medicine? MEMANTINE (MEM an teen) is used to treat dementia caused by Alzheimer's disease. This medicine may be used for other purposes; ask your health care provider or pharmacist if you have questions. COMMON BRAND NAME(S): Namenda XR What should I tell my health care provider before I take this medicine? They need to know if you have any of these conditions: -difficulty passing urine -kidney disease -liver disease -seizures -an unusual or allergic reaction to memantine, other medicines, foods, dyes, or preservatives -pregnant or trying to get pregnant -breast-feeding How should I use this medicine? Take this medicine by mouth with a glass of water. Follow the directions on the prescription label. You may take this medicine with or without food. You may swallow the capsules whole or open them and sprinkle the entire contents on applesauce before swallowing. Other than sprinkling the medicine on applesauce, the capsules should be swallowed whole and not divided, chewed, or crushed. Take your doses at regular intervals. Do not take your medicine more often than directed. Continue to take your medicine even if you feel better. Do not stop taking except on the advice of your doctor or health care professional. Talk to your pediatrician regarding the use of this medicine in children. Special care may be needed. Overdosage: If you think you've taken too much of this medicine contact a poison control center or emergency room at once. Overdosage: If you think you have taken too much of this medicine contact a poison control center or emergency room at once. NOTE: This medicine is only for you. Do not share this medicine with others. What if I miss a dose? If you miss a dose, take it as soon as you can. If it is almost time for your next dose, take only that dose. Do not take double or extra doses. If you do not take your medicine for several days,  contact your health care provider. Your dose may need to be changed. What may interact with this medicine? -acetazolamide -amantadine -cimetidine -dextromethorphan -dofetilide -hydrochlorothiazide -ketamine -metformin -methazolamide -quinidine -ranitidine -sodium bicarbonate -triamterene This list may not describe all possible interactions. Give your health care provider a list of all the medicines, herbs, non-prescription drugs, or dietary supplements you use. Also tell them if you smoke, drink alcohol, or use illegal drugs. Some items may interact with your medicine. What should I watch for while using this medicine? Visit your doctor or health care professional for regular checks on your progress. Check with your doctor or health care professional if there is no improvement in your symptoms or if they get worse. You may get drowsy or dizzy. Do not drive, use machinery, or do anything that needs mental alertness until you know how this drug affects you. Do not stand or sit up quickly, especially if you are an older patient. This reduces the risk of dizzy or fainting spells. Alcohol can make you more drowsy and dizzy. Avoid alcoholic drinks. What side effects may I notice from receiving this medicine? Side effects that you should report to your doctor or health care professional as soon as possible: -agitation or a feeling of restlessness -allergic reactions like skin rash, itching or hives, swelling of the face, lips, or tongue -depressed mood -dizziness -hallucinations -redness, blistering, peeling or loosening of the skin, including inside the mouth -seizures -vomiting Side effects that usually do not require medical attention (Report these to your doctor or health care professional if they continue or are bothersome.): -  constipation -diarrhea -headache -nausea -trouble sleeping This list may not describe all possible side effects. Call your doctor for medical advice about side  effects. You may report side effects to FDA at 1-800-FDA-1088. Where should I keep my medicine? Keep out of the reach of children. Store at room temperature between 15 degrees and 30 degrees C (59 degrees and 86 degrees F). Throw away any unused medicine after the expiration date. NOTE: This sheet is a summary. It may not cover all possible information. If you have questions about this medicine, talk to your doctor, pharmacist, or health care provider.  2015, [employer]. (2013-06-11 13:59:18)  

## 2014-12-19 ENCOUNTER — Encounter: Payer: Self-pay | Admitting: *Deleted

## 2014-12-30 ENCOUNTER — Ambulatory Visit: Payer: Medicare Other | Admitting: Neurology

## 2015-01-28 ENCOUNTER — Encounter: Payer: Self-pay | Admitting: Neurology

## 2015-01-28 ENCOUNTER — Ambulatory Visit (INDEPENDENT_AMBULATORY_CARE_PROVIDER_SITE_OTHER): Payer: Medicare Other | Admitting: Neurology

## 2015-01-28 VITALS — BP 144/90 | HR 86 | Resp 18 | Ht 70.0 in | Wt 159.0 lb

## 2015-01-28 DIAGNOSIS — F0280 Dementia in other diseases classified elsewhere without behavioral disturbance: Secondary | ICD-10-CM | POA: Diagnosis not present

## 2015-01-28 DIAGNOSIS — I7774 Dissection of vertebral artery: Secondary | ICD-10-CM | POA: Insufficient documentation

## 2015-01-28 DIAGNOSIS — G3184 Mild cognitive impairment, so stated: Secondary | ICD-10-CM | POA: Diagnosis not present

## 2015-01-28 DIAGNOSIS — F329 Major depressive disorder, single episode, unspecified: Principal | ICD-10-CM

## 2015-01-28 DIAGNOSIS — F0393 Unspecified dementia, unspecified severity, with mood disturbance: Secondary | ICD-10-CM | POA: Insufficient documentation

## 2015-01-28 MED ORDER — BUPROPION HCL ER (XL) 150 MG PO TB24: 150.0000 mg | ORAL_TABLET | Freq: Every day | ORAL | Status: DC

## 2015-01-28 NOTE — Progress Notes (Signed)
Provider:  Melvyn Novas, M D  Referring Provider: [name], MD Primary Care Physician:  [name], MD  Chief Complaint  Patient presents with  . Follow-up    CVA and memory, rm 11, alone    HPI:  [name] is a 74 y.o. male  Is seen here after a prolonged time, I used to follow this stroke patient during his hospitalization 10 years ago. His visit today was arranged by his wife, his PCP is [name],  Mr. Keilman is today referred for a memory evaluation .Because of his history of a complicated CVA ,brain stem stroke syndromes and hemiparesis , a vascular effect has to be taken into account. Mr. Manes father had late onset dementia and personality changes and Mrs. Zajicek sees some parallels in his behavior. He has become more aggressive, easier agitated, which was not part of his personality before this year. The couple celebreated their Renette Butters wedding anniversary in 2014 ,  active in the [employer] . Mr. Debruhl performs household chores and the couple spends time with the grandchildren now 9 and 52.  He has become impulsive, and sometimes" downright mean ". He still uses a urinary cath. Has right hand weakness , leg stiffness, left sensory loss, right facial weakness -hold cane in the left hand. Dysphonia, aphasia.    Interval , 06-27-14, Patient is doing well, has mild cognitive impairment.  Namenda  Working well, no side effect.  Gave voucher for 30 days for free.  He has been more mellow.   Mr. Stillinger feels not under stress and scrutiny as much and he feels at ease.  I will schedule RV 6 month from now and get a CT scan , EEG and possible start Arcicept if MMSE declines ( or Namzaric) .     Review of Systems: Out of a complete 14 system review, the patient complains of only the following symptoms, and all other reviewed systems are negative.  Aphasia, dysarthria,  MMSE 26 points. MOCA not possble, MMSE last 27 -30 points.  Geriatric depression score  2 points.    History   Social History  . Marital Status: Married    Spouse Name: [name]  . Number of Children: 2  . Years of Education: 12   Occupational History  . Not on file.   Social History Main Topics  . Smoking status: Never Smoker   . Smokeless tobacco: Never Used  . Alcohol Use: No  . Drug Use: No  . Sexual Activity: Not on file   Other Topics Concern  . Not on file   Social History Narrative   Patient is married [name]) and lives at home with his wife.   Patient is disabled.    Patient has two adult children and two grandchildren.   Patient has a high school education.   Patient is left-handed.   Patient drinks one cup of coffee daily.             Family History  Problem Relation Age of Onset  . Colon cancer Brother     Past Medical History  Diagnosis Date  . Hypertension     without medication since 2006  . Dyslipidemia   . Major depression   . CVA (cerebral infarction)     Left thalamic, right-sided weakness wih aphagia, right leg brace  . Elevated PSA     biopsy negative in 2008  . GERD (gastroesophageal reflux disease)   . Seasonal rhinitis   .  CKD (chronic kidney disease), stage II     History reviewed. No pertinent past surgical history.  Current Outpatient Prescriptions  Medication Sig Dispense Refill  . aspirin 81 MG tablet Take 81 mg by mouth daily.    . DULoxetine (CYMBALTA) 60 MG capsule Take 60 mg by mouth daily.    Marland Kitchen FLUARIX QUADRIVALENT 0.5 ML injection     . Memantine HCl ER 28 MG CP24 Take 28 mg by mouth every morning. 30 capsule 5  . simvastatin (ZOCOR) 40 MG tablet Take 40 mg by mouth daily.     No current facility-administered medications for this visit.    Allergies as of 01/28/2015  . (No Known Allergies)    Vitals: BP 144/90 mmHg  Pulse 86  Resp 18  Ht  (1.778 m)  Wt 159 lb (72.122 kg)  BMI 22.81 kg/m2 Last Weight:  Wt Readings from Last 1 Encounters:  01/28/15 159 lb (72.122 kg)   Last Height:     Ht Readings from Last 1 Encounters:  01/28/15  (1.778 m)    Physical exam:  General: The patient is awake, alert and appears not in acute distress. The patient is well groomed. Head: Normocephalic, atraumatic. Neck is supple. Mallampati 2 neck circumference: 14.5  Cardiovascular:  Regular rate and rhythm , without  murmurs or carotid bruit, and without distended neck veins. Respiratory: Lungs are clear to auscultation. Skin:  Without evidence of edema, or rash Trunk: Neurologic exam : The patient is awake and alert, oriented to place and time.   Mr. Solem performed a more Mini-Mental Status Examination today he stopped the first 2 of the serial sevens. And performed the word backward spelling and stat here she scored to full numbers. His overall score today was 24 out of 30 on 5-20 4-16. I ability again exclude the handwriting since Mr. Nahar is impaired through his brainstem stroke, and is drawing but he was able to place the hands into the clock face correctly. Animal fluency test was 9. Geriatric depression score today was 1 points which indicates that there is some depression present. he missed all 1 recall words.  There is a normal attention span & concentration ability. Speech  dysarthria, dysphonia and  Aphasia.  Mood and affect are appropriate.  Cranial nerves: Pupils are unequal but briskly reactive to light. He has lost the right peripheral vision, stereo vision is impaired -  He cannot drive , no diplopia.  Hearing to finger rub intact. Facial sensation intact to fine touch. Facial motor strength is symmetric and tongue and uvula move midline. Tongue protrusion into either cheek is normal! Shoulder shrug is normal.    Gait and station: Patient walks with a cane , needs help with getting up, rises with assistance.  Deep tendon reflexes: in the  upper and lower extremities are asymmetric, brisk in the right .Babinski maneuver response is upgoing right .   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    Mild cognitive impairment.  Short term memory loss. Progressive, He has now lost the serial 7 capacity.   He cannot be measured on MOCA due to vision and handwriting impairment. Mini-Mental Status Examination revealed today 24 out of 30 points which is not a progression by score.  Depression is present. The understand that Mr. Whitehurst who has been chronic  the impaired for long time since his brainstem stroke and also has some additional age related memory changes now is sometimes very frustrated. He sometimes angry and frustrated, this definitely warrants antidepression treatment. The geriatric depression score was endorsed at 7 points which is very indicative of depression  Plan:   There are multiple changes in the social environment, Mrs. [name] and is now on peritoneal dialysis she actually failed 3 attempts and is now followed at [facility], [address]. [name] sometimes forgets to take his medication I suggest to place it on in a pillbox perhaps one of those that have an automatic alarm. He is taking Namenda for mild cognitive impairment status post cerebrovascular insult. In addition I will start him today on antidepressive and I like for him to have something that is stimulating as well as antidepressive and and he never had seizures he doesn't have any history of cardiac irregularities.  I will try Wellbutrin in the morning with him to give him a little extra energy for the day.   Porfirio Mylararmen Julianny Milstein MD 01/28/2015

## 2015-06-30 ENCOUNTER — Ambulatory Visit (INDEPENDENT_AMBULATORY_CARE_PROVIDER_SITE_OTHER): Payer: Medicare Other | Admitting: Neurology

## 2015-06-30 ENCOUNTER — Encounter: Payer: Self-pay | Admitting: Neurology

## 2015-06-30 VITALS — BP 130/82 | HR 82 | Resp 20 | Ht 68.0 in | Wt 155.0 lb

## 2015-06-30 DIAGNOSIS — F09 Unspecified mental disorder due to known physiological condition: Secondary | ICD-10-CM | POA: Diagnosis not present

## 2015-06-30 DIAGNOSIS — F0391 Unspecified dementia with behavioral disturbance: Secondary | ICD-10-CM

## 2015-06-30 DIAGNOSIS — F0789 Other personality and behavioral disorders due to known physiological condition: Secondary | ICD-10-CM | POA: Insufficient documentation

## 2015-06-30 DIAGNOSIS — F03918 Unspecified dementia, unspecified severity, with other behavioral disturbance: Secondary | ICD-10-CM

## 2015-06-30 NOTE — Progress Notes (Signed)
Provider:  Melvyn Novasarmen  Hermine Feria, M D  Referring Provider: Georgann HousekeeperHusain, Karrar, MD Primary Care Physician:  Georgann HousekeeperHUSAIN,KARRAR, MD  Chief Complaint  Patient presents with  . Follow-up    memory, rm 10, with wife    HPI:  Angie FavaDonnie Stumpo is a 74 y.o. male  Is seen here after a prolonged time, I used to follow this stroke patient during his hospitalization 10 years ago. His visit today was arranged by his wife, his PCP is Dr. Donette LarryHusain,  [name] is today referred for a memory evaluation .Because of his history of a complicated CVA ,brain stem stroke syndromes and hemiparesis , a vascular effect has to be taken into account. [name] father had late onset dementia and personality changes and Mrs. [name] sees some parallels in his behavior. He has become more aggressive, easier agitated, which was not part of his personality before this year. The couple celebreated their Renette ButtersGolden wedding anniversary in 2014 ,  active in the [employer] [place] . [name] and the couple spends time with the grandchildren now [id number] and [id number].  He has become impulsive, and sometimes" downright mean ". He still uses a urinary cath. Has right hand weakness , leg stiffness, left sensory loss, right facial weakness -hold cane in the left hand. Dysphonia, aphasia.    Interval History: 06-27-14, Patient is doing well, has mild cognitive impairment. Mrs. Nixons birthday.  Namenda  Working well, no side effect.  Gave voucher for 30 days for free.  He has been more mellow. [name] feels not under stress and scrutiny as much and he feels at ease.  I will schedule RV 6 month from now and get a CT scan , EEG and possible start Arcicept if MMSE declines ( or Namzaric).   Interval history from 06-30-15 We performed today and Mini-Mental Status Examination and have to account for Mr. Biancardi's right  hand hemiparesis. He  Is left handed and was able to score 24 out of 29 points but we deferred a clock drawing  test or any drawing test. Animal fluency test was 9. [name] appears stable given the Mini-Mental Status Examination, he endorsed the geriatric depression scale at only 2 points. We have discussed 18 months ago that [name] had a period where he became easier agitated angry frustrated. This seems to have been resolved. The [facility]er[facility] have not renewed Namenda prescription partially due to a high co-pay of $90. In addition but felt that this [name] did not benefit that much. This only occurred about a month ago. We will see if his Mini-Mental status remains stable for the next 12 month.   Review of Systems: Out of a complete 14 system review, the patient complains of only the following symptoms, and all other reviewed systems are negative.  Aphasia, dysarthria,  MMSE  24 from 26 points from 27 pints . MOCA not possible, MMSE slow decline over 24 month.  Geriatric depression score 2 points.    Social History   Social History  . Marital Status: Married    Spouse Name: Corrie DandyMary  . Number of Children: 2  . Years of Education: 12   Occupational History  . Not on file.   Social History Main Topics  . Smoking status: Never Smoker   . Smokeless tobacco: Never Used  . Alcohol Use: No  . Drug Use: No  . Sexual Activity: Not on file   Other Topics Concern  . Not on  file   Social History Narrative   Patient is married [name]) and lives at home with his wife.   Patient is disabled.    Patient has two adult children and two grandchildren.   Patient has a high school education.   Patient is left-handed.   Patient drinks one cup of coffee daily.             Family History  Problem Relation Age of Onset  . Colon cancer Brother     Past Medical History  Diagnosis Date  . Hypertension     without medication since 2006  . Dyslipidemia   . Major depression (HCC)   . CVA (cerebral infarction)     Left thalamic, right-sided weakness wih aphagia, right leg brace  . Elevated PSA      biopsy negative in 2008  . GERD (gastroesophageal reflux disease)   . Seasonal rhinitis   . CKD (chronic kidney disease), stage II     Past Surgical History  Procedure Laterality Date  . Nasal septum surgery    . Vasectomy      Current Outpatient Prescriptions  Medication Sig Dispense Refill  . aspirin 81 MG tablet Take 81 mg by mouth daily.    Marland Kitchen buPROPion (WELLBUTRIN XL) 150 MG 24 hr tablet Take 1 tablet (150 mg total) by mouth daily. 30 tablet 5  . DULoxetine (CYMBALTA) 60 MG capsule Take 60 mg by mouth daily.    . simvastatin (ZOCOR) 40 MG tablet Take 40 mg by mouth daily.    . Memantine HCl ER 28 MG CP24 Take 28 mg by mouth every morning. (Patient not taking: Reported on 06/30/2015) 30 capsule 5   No current facility-administered medications for this visit.    Allergies as of 06/30/2015  . (No Known Allergies)    Vitals: BP 130/82 mmHg  Pulse 82  Resp 20  Ht  (1.727 m)  Wt 155 lb (70.308 kg)  BMI 23.57 kg/m2 Last Weight:  Wt Readings from Last 1 Encounters:  06/30/15 155 lb (70.308 kg)   Last Height:   Ht Readings from Last 1 Encounters:  06/30/15  (1.727 m)    Physical exam:  General: The patient is awake, alert and appears not in acute distress. The patient is well groomed. Head: Normocephalic, atraumatic. Neck is supple. Mallampati 2 neck circumference: 14.5  Cardiovascular:  Regular rate and rhythm , without  murmurs or carotid bruit, and without distended neck veins. Respiratory: Lungs are clear to auscultation. Skin:  Without evidence of edema, or rash Trunk: Neurologic exam : The patient is awake and alert, oriented to place and time.   Mr. Kehl performed a more Mini-Mental Status Examination today he stopped the first 2 of the serial sevens. And performed the word backward spelling and stat here she scored to full numbers. His overall score today was 24 out of 30 on 5-20 4-16. I ability again exclude the handwriting since Mr. Sheerin is  impaired through his brainstem stroke, and is drawing but he was able to place the hands into the clock face correctly. Animal fluency test was 9. Geriatric depression score today was 1 points which indicates that there is some depression present. he missed all 1 recall words.  There is a normal attention span & concentration ability. Speech  dysarthria, dysphonia and  Aphasia.  Mood and affect are appropriate.  Cranial nerves: Pupils are unequal but briskly reactive to light. He has lost the right peripheral vision, stereo vision  is impaired -  He cannot drive , no diplopia.  Hearing to finger rub intact. Facial sensation intact to fine touch. Facial motor strength is symmetric and tongue and uvula move midline. Tongue protrusion into either cheek is normal! Shoulder shrug is normal.    Gait and station: Patient walks with a cane , needs help with getting up, rises with assistance.  Deep tendon reflexes: in the  upper and lower extremities are asymmetric, brisk in the right .Babinski maneuver response is upgoing right .   Assessment:   25 minute vist with more than 50% of the face to face time dedicated to discussion of mental status, depression and dementia. Cognitive changes. Fall prevention.  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    Mild cognitive impairment.  Short term memory loss. Progressive, He has now lost the serial 7 capacity. Slow progression.   He cannot be measured on MOCA due to vision and handwriting impairment. Mini-Mental Status Examination revealed today 22 out of 30 points which is a slow  progression by score. Beginning of dementia rather than MCI.   Depression is present, but hs behavior has improved. Less agitated and less angry.   The geriatric depression score was endorsed at 2  , down from  7 points which is very indicative of depression  Plan:   Mr. Sartin sometimes forgets to take his medication I suggest to place it on in a pillbox  (perhaps one of those that have an automatic alarm).  He is not longer taking Namenda for mild cognitive impairment status post cerebrovascular insult. In addition he takes an  antidepressive and I like for him to have something that is stimulating as well as antidepressive Wellbutrin was chosen. He has neither a history of seizures.  He doesn't have any history of cardiac irregularities.     Porfirio Mylar Jeramey Lanuza MD   Cc Dr Donette Larry, [place]  06/30/2015

## 2015-12-29 ENCOUNTER — Ambulatory Visit (INDEPENDENT_AMBULATORY_CARE_PROVIDER_SITE_OTHER): Payer: Medicare Other | Admitting: Neurology

## 2015-12-29 ENCOUNTER — Encounter: Payer: Self-pay | Admitting: Neurology

## 2015-12-29 VITALS — BP 132/72 | HR 86 | Resp 20 | Ht 70.0 in | Wt 153.0 lb

## 2015-12-29 DIAGNOSIS — G3184 Mild cognitive impairment, so stated: Secondary | ICD-10-CM | POA: Diagnosis not present

## 2015-12-29 DIAGNOSIS — F0393 Unspecified dementia, unspecified severity, with mood disturbance: Secondary | ICD-10-CM

## 2015-12-29 DIAGNOSIS — F0280 Dementia in other diseases classified elsewhere without behavioral disturbance: Secondary | ICD-10-CM | POA: Diagnosis not present

## 2015-12-29 DIAGNOSIS — F329 Major depressive disorder, single episode, unspecified: Secondary | ICD-10-CM | POA: Diagnosis not present

## 2015-12-29 DIAGNOSIS — I7774 Dissection of vertebral artery: Secondary | ICD-10-CM | POA: Diagnosis not present

## 2015-12-29 MED ORDER — BUPROPION HCL ER (XL) 150 MG PO TB24: 150.0000 mg | ORAL_TABLET | Freq: Every day | ORAL | Status: DC

## 2015-12-29 NOTE — Progress Notes (Signed)
Provider:  Melvyn Novasarmen  Sriyan Cutting, M D  Referring Provider: Georgann HousekeeperHusain, Karrar, MD Primary Care Physician:  Georgann HousekeeperHUSAIN,KARRAR, MD  Chief Complaint  Patient presents with  . Follow-up    memory "about the same" with wife, rm 11    HPI:  Dennis FavaDonnie Graham is a 75 y.o. male  Is seen here after a prolonged time, I used to follow this stroke patient during his hospitalization 10 years ago. His visit today was arranged by his wife, his PCP is Dr. Donette LarryHusain,  [name] is today referred for a memory evaluation .Because of his history of a complicated CVA ,brain stem stroke syndromes and hemiparesis , a vascular effect has to be taken into account. Dennis. [name]'s father had late onset dementia and personality changes and Mrs. Fraser Graham sees some parallels in his behavior. He has become more aggressive, easier agitated, which was not part of his personality before this year. The couple celebreated their Renette ButtersGolden wedding anniversary in 2014 ,  active in the [employer] [place] . [name] performs household chores and the couple spends time with the grandchildren now [id number] and [id number].  He has become impulsive, and sometimes" downright mean ". He still uses a urinary cath. Has right hand weakness , leg stiffness, left sensory loss, right facial weakness -hold cane in the left hand. Dysphonia, aphasia.   Interval History: 06-27-14, Patient is doing well, has mild cognitive impairment. Mrs. [name] birthday. Namenda  Working well, no side effect.  Gave voucher for 30 days for free.  He has been more mellow. [name] feels not under stress and scrutiny as much and he feels at ease. I will schedule RV 6 month from now and get a CT scan , EEG and possible start Arcicept if MMSE declines ( or Namzaric).  Interval history from 06-30-15 We performed today and Mini-Mental Status Examination and have to account for [name]'s right hand hemiparesis. He Is left handed and was able to score 24 out of 29 points but we deferred a clock  drawing test or any drawing test. Animal fluency test was 9. [name] appears stable given the Mini-Mental Status Examination, he endorsed the geriatric depression scale at only 2 points. We have discussed 18 months ago that [name] had a period where he became easier agitated angry frustrated. This seems to have been resolved. [facility] have not renewed Namenda prescription partially due to a high co-pay of $90. In addition but felt that this [name] did not benefit that much. This only occurred about a month ago. We will see if his Mini-Mental status remains stable for the next 12 month.  12-29-2015 [name] is falling more often, his skin is scabbed. He fell rising while pulling up on his cane. His walker has a seat and is used outside.  His other cane will have 3 prongs. He sleeps a lot. His wife started peritoneal dialysis.  2 times a day. Needs to wear a diaper for bowel accident. Is very happy with Dr. Abel Prestoolodonato.  She has a sleep apnea evaluation tonight. [name] snores terribly, even before his brainstem stroke, dependent on supine position.    Review of Systems: Out of a complete 14 system review, the patient complains of only the following symptoms, and all other reviewed systems are negative. He has never been a reader, watches TV , sleeps a lot.  Aphasia, dysarthria,  MMSE  23 from 26 points from 27 points . MOCA not possible, MMSE slow  decline over 24 month.  Geriatric depression score 2 points.    Social History   Social History  . Marital Status: Married    Spouse Name: [name]  . Number of Children: 2  . Years of Education: 12   Occupational History  . Not on file.   Social History Main Topics  . Smoking status: Never Smoker   . Smokeless tobacco: Never Used  . Alcohol Use: No  . Drug Use: No  . Sexual Activity: Not on file   Other Topics Concern  . Not on file   Social History Narrative   Patient is married [name]) and lives at home with his wife.    Patient is disabled.    Patient has two adult children and two grandchildren.   Patient has a high school education.   Patient is left-handed.   Patient drinks one cup of coffee daily.             Family History  Problem Relation Age of Onset  . Colon cancer Brother     Past Medical History  Diagnosis Date  . Hypertension     without medication since 2006  . Dyslipidemia   . Major depression (HCC)   . CVA (cerebral infarction)     Left thalamic, right-sided weakness wih aphagia, right leg brace  . Elevated PSA     biopsy negative in 2008  . GERD (gastroesophageal reflux disease)   . Seasonal rhinitis   . CKD (chronic kidney disease), stage II     Past Surgical History  Procedure Laterality Date  . Nasal septum surgery    . Vasectomy      Current Outpatient Prescriptions  Medication Sig Dispense Refill  . aspirin 81 MG tablet Take 81 mg by mouth daily.    Marland Kitchen buPROPion (WELLBUTRIN XL) 150 MG 24 hr tablet Take 1 tablet (150 mg total) by mouth daily. 30 tablet 5  . DULoxetine (CYMBALTA) 60 MG capsule Take 60 mg by mouth daily.    . simvastatin (ZOCOR) 40 MG tablet Take 40 mg by mouth daily.     No current facility-administered medications for this visit.    Allergies as of 12/29/2015  . (No Known Allergies)    Vitals: BP 132/72 mmHg  Pulse 86  Resp 20  Ht  (1.778 m)  Wt 153 lb (69.4 kg)  BMI 21.95 kg/m2 Last Weight:  Wt Readings from Last 1 Encounters:  12/29/15 153 lb (69.4 kg)   Last Height:   Ht Readings from Last 1 Encounters:  12/29/15  (1.778 m)   Montreal Cognitive Assessment  05/31/2014  Visuospatial/ Executive (0/5) 3  Naming (0/3) 3  Attention: Read list of digits (0/2) 2  Attention: Read list of letters (0/1) 1  Attention: Serial 7 subtraction starting at 100 (0/3) 3  Language: Repeat phrase (0/2) 2  Language : Fluency (0/1) 1  Abstraction (0/2) 2  Delayed Recall (0/5) 0  Orientation (0/6) 4  Total 21   MMSE - Mini Mental  State Exam 12/29/2015 06/30/2015 06/28/2014  Orientation to time Orientation to Place Registration Attention/ Calculation Recall Language- name 2 objects Language- repeat Language- follow 3 step command Language- read & follow direction Write a sentence Copy design 1 0 1  Total score Physical exam:  General: The patient is awake, alert and appears not in acute distress. The patient is well groomed. Head: Normocephalic, atraumatic. Neck is supple. Mallampati 2 neck circumference: 14.5  Cardiovascular:  Regular rate and rhythm , without  murmurs or carotid bruit, and without distended neck veins. Respiratory: Lungs are clear to auscultation. Skin:  Without evidence of edema, or rash.  Trunk: Neurologic exam : The patient is awake and alert, oriented to place and time.   [name] performed a more Mini-Mental Status Examination today he stopped the first 2 of the serial sevens. Accessed  again excluding the handwriting since [name]  is impaired through his brainstem stroke, as is drawing , but he was able to place the hands into the clock face correctly.  There is a normal attention span & concentration ability. Speech  dysarthria, dysphonia and  Aphasia  Mood and affect are appropriate.  Cranial nerves: Pupils are unequal but briskly reactive to light. He has lost the right peripheral vision, stereo vision is impaired -  He cannot drive , no diplopia.  Hearing to finger rub intact. Facial sensation intact to fine touch. Facial motor strength is symmetric and tongue and uvula move midline. Tongue protrusion into either cheek is normal! Shoulder shrug is normal.  Gait and station: Patient walks with a cane , needs help with getting up, rises with assistance.  Deep tendon reflexes: in the  upper and lower extremities are asymmetric, brisk in the right .Babinski maneuver response is upgoing right  .  Assessment:   40 minute vist with more than 50% of the face to face time dedicated to discussion of mental status, depression and dementia. Cognitive changes. Fall prevention.  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   Mild cognitive impairment.  Short term memory loss. Progressive, He has now lost the serial 7 capacity. Slow progression of memory impairment, constitutes dementia.    He cannot be measured on MOCA due to vision and handwriting impairment. Mini-Mental Status Examination revealed today 22 out of 30 points which is a slow  progression by score. Beginning of dementia rather than MCI.   Depression is present, but hs behavior has improved. Less agitated and less angry on welll butrin. More energy. .   The geriatric depression score was endorsed at 2 , down from 7 points which was very indicative of depression  Plan:   [name] sometimes forgets to take his medication and I suggested to place it on in a pillbox  (perhaps one of those that have an automatic alarm).  He is not longer taking Namenda for mild cognitive impairment status post cerebrovascular insult. In addition he takes an antidepressive and I like for him to have something that is stimulating as well; as an antidepressivant,  Wellbutrin was chosen.  He has not a history of seizures.  He doesn't have any history of cardiac irregularities.     Dennis Mylar Jaella Weinert MD   Cc Dr Donette Larry, [place]  12/29/2015

## 2015-12-29 NOTE — Patient Instructions (Signed)
Vascular Dementia Vascular dementia is a common cause of dementia in the elderly. Dementia is a condition that affects the brain and causes people to not think well or act normally. Vascular dementia is one type of dementia. It occurs when blood clots block small blood vessels in the brain and destroy brain tissue. Likely risk factors are high blood pressure and advanced age. This disease can cause stroke, migraine-like headaches, and psychiatric disturbances.  SYMPTOMS   Confusion.  Problems with recent memory.  Wandering or getting lost in familiar places.  Loss of bladder or bowel control (incontinence).  Unsteady gait.  Poor attention and concentration.  Emotional problems such as laughing or crying inappropriately.  Difficulty following instructions.  Problems handling money.  Depression.  Difficulty planning ahead. Usually the damage is slight at first. Over time, as more small vessels are blocked, there is a gradual mental decline. However, symptoms may begin suddenly. Symptoms may be very similar to Alzheimer's disease. The two forms of dementia may occur together. Vascular dementia typically begins between the ages of 60 and 75. It affects men more often than women. TREATMENT   Currently there is no treatment for vascular dementia that can reverse the damage that has already occurred.  Treatment focuses on prevention of additional brain damage and improvement of symptoms.  It is important to treat the risk factors for vascular dementia, such as keeping blood pressure under control, treating diabetes, lowering cholesterol, and stop smoking. PROGNOSIS   Prognosis for patients is generally poor. Individuals with the disease may improve for short periods of time, then get worse again. Early treatment and management of blood pressure and other risk factors may prevent further worsening of the disorder.   This information is not intended to replace advice given to you by your  health care provider. Make sure you discuss any questions you have with your health care provider.   Document Released: 08/13/2002 Document Revised: 11/15/2011 Document Reviewed: 12/04/2014 Elsevier Interactive Patient Education 2016 Elsevier Inc.  

## 2016-01-09 ENCOUNTER — Other Ambulatory Visit: Payer: Self-pay | Admitting: Neurology

## 2016-05-31 ENCOUNTER — Telehealth: Payer: Self-pay

## 2016-05-31 NOTE — Telephone Encounter (Signed)
FMLA form for pt's daughter, [name], completed by Dr. Vickey Hugerohmeier and sent to [facility] for processing.

## 2016-06-02 ENCOUNTER — Ambulatory Visit (INDEPENDENT_AMBULATORY_CARE_PROVIDER_SITE_OTHER): Payer: Medicare Other | Admitting: Neurology

## 2016-06-02 ENCOUNTER — Encounter: Payer: Self-pay | Admitting: Neurology

## 2016-06-02 VITALS — BP 160/88 | HR 68 | Resp 20 | Ht 70.0 in | Wt 149.0 lb

## 2016-06-02 DIAGNOSIS — I6350 Cerebral infarction due to unspecified occlusion or stenosis of unspecified cerebral artery: Secondary | ICD-10-CM

## 2016-06-02 DIAGNOSIS — Z742 Need for assistance at home and no other household member able to render care: Secondary | ICD-10-CM

## 2016-06-02 DIAGNOSIS — I639 Cerebral infarction, unspecified: Secondary | ICD-10-CM

## 2016-06-02 NOTE — Progress Notes (Signed)
Provider:  Melvyn Novas, M D  Referring Provider: [name], MD Primary Care Physician:  [name], MD  Chief Complaint  Patient presents with  . Follow-up    memory    HPI:  [name] is a 75 y.o. male  Is seen here after a prolonged time, I used to follow this stroke patient during his hospitalization 10 years ago. His visit today was arranged by his wife, his PCP is [name], [name] was referred for a memory evaluation .Because of his history of a complicated CVA ,brain stem stroke syndromes and hemiparesis , a vascular effect has to be taken into account. [name] had late onset dementia and personality changes and [name] sees some parallels in his behavior. He has become more aggressive, easier agitated, which was not part of his personality before this year. The couple celebreated their Renette Butters wedding anniversary in 2014,  active in the [employer] in [place] . [name] performs household chores and the couple spends time with the grandchildren now 8 and 68.  He has become impulsive, and sometimes" downright mean ". He still uses a urinary cath. Has right hand weakness , leg stiffness, left sensory loss, right facial weakness -hold cane in the left hand. Dysphonia, aphasia.   I 12-29-2015 Mr Graham is falling more often, his skin is scabbed. He fell rising while pulling up on his cane. His walker has a seat and is used outside.  His other cane will have 3 prongs. He sleeps a lot. His wife started peritoneal dialysis.  2 times a day. Needs to wear a diaper for bowel accident. Is very happy with Dr. Abel Presto.  She has a sleep apnea evaluation tonight. [name] snores terribly, even before his brainstem stroke, dependent on supine position.   I have  the pleasure today on 06/02/2016 to see [name] again, today in the presence of his daughter [name]. [name] had become extremely daytime sleepy over the last couple of months while his wife's  health has declined to the level where she could not safely ambulate for longer distance, and she is probably no longer safe to drive. She failed peritoneal dialysis, became extremely dehydrated and was insufficiently dialyzed. She has not a switch to hemodialysis but still is not thriving. For this reason the novel take over the organization of home health care which she initiated already. And also the healthcare power of attorney. She has requested FMLA. In the meantime home health care has been of significant benefit for him. He is eating more regular meals, he is leaving the bed at a certain time each morning. He does not need help with dressing. He is walking with a cane. He is alert oriented and able to answer my questions. He is getting daily physical therapy by home health. He has improved his right hemi-spasticity, he is using her single-prong cane. He could raise today from a seated position without assistance of another person.   Review of Systems: Out of a complete 14 system review, the patient complains of only the following symptoms, and all other reviewed systems are negative. He has never been a reader, watches TV , sleeps a lot.  Aphasia, dysarthria,   spastic hemiparesis. Right dominant side.    Social History   Social History  . Marital status: Married    Spouse name: [name]  . Number of children: 2  . Years of education: 12   Occupational History  . Not  on file.   Social History Main Topics  . Smoking status: Never Smoker  . Smokeless tobacco: Never Used  . Alcohol use No  . Drug use: No  . Sexual activity: Not on file   Other Topics Concern  . Not on file   Social History Narrative   Patient is married Dennis Dandy) and lives at home with his wife.   Patient is disabled.    Patient has two adult children and two grandchildren.   Patient has a high school education.   Patient is left-handed.   Patient drinks one cup of coffee daily.             Family History    Problem Relation Age of Onset  . Colon cancer Brother     Past Medical History:  Diagnosis Date  . CKD (chronic kidney disease), stage II   . CVA (cerebral infarction)    Left thalamic, right-sided weakness wih aphagia, right leg brace  . Dyslipidemia   . Elevated PSA    biopsy negative in 2008  . GERD (gastroesophageal reflux disease)   . Hypertension    without medication since 2006  . Major depression (HCC)   . Seasonal rhinitis     Past Surgical History:  Procedure Laterality Date  . NASAL SEPTUM SURGERY    . VASECTOMY      Current Outpatient Prescriptions  Medication Sig Dispense Refill  . aspirin 81 MG tablet Take 81 mg by mouth daily.    Marland Kitchen buPROPion (WELLBUTRIN XL) 150 MG 24 hr tablet Take 1 tablet (150 mg total) by mouth daily. 30 tablet 5  . DULoxetine (CYMBALTA) 60 MG capsule Take 60 mg by mouth daily.    . simvastatin (ZOCOR) 40 MG tablet Take 40 mg by mouth daily.     No current facility-administered medications for this visit.     Allergies as of 06/02/2016  . (No Known Allergies)    Vitals: BP (!) 160/88   Pulse 68   Resp 20   Ht 5\' 10"  (1.778 m)   Wt 149 lb (67.6 kg)   BMI 21.38 kg/m  Last Weight:  Wt Readings from Last 1 Encounters:  06/02/16 149 lb (67.6 kg)   Last Height:   Ht Readings from Last 1 Encounters:  06/02/16 5\' 10"  (1.778 m)   Montreal Cognitive Assessment  05/31/2014  Visuospatial/ Executive (0/5) 3  Naming (0/3) 3  Attention: Read list of digits (0/2) 2  Attention: Read list of letters (0/1) 1  Attention: Serial 7 subtraction starting at 100 (0/3) 3  Language: Repeat phrase (0/2) 2  Language : Fluency (0/1) 1  Abstraction (0/2) 2  Delayed Recall (0/5) 0  Orientation (0/6) 4  Total 21   MMSE - Mini Mental State Exam 06/02/2016 12/29/2015 06/30/2015  Orientation to time 4 1 2   Orientation to Place 5 5 5   Registration 3 3 3   Attention/ Calculation 1 3 3   Recall 2 2 3   Language- name 2 objects 2 2 2   Language- repeat  1 1 1   Language- follow 3 step command 3 3 3   Language- read & follow direction 1 1 1   Write a sentence 1 1 1   Copy design 1 1 0  Total score 24 23 24     Physical exam:  General: The patient is awake, alert and appears not in acute distress. The patient is well groomed. Head: Normocephalic, atraumatic. Neck is supple. Mallampati 2 neck circumference: 14.5  Cardiovascular:  Regular rate  and rhythm , without  murmurs or carotid bruit, and without distended neck veins. Respiratory: Lungs are clear to auscultation. Skin:  Without evidence of edema, or rash.  Trunk: Neurologic exam : The patient is awake and alert, oriented to place and time.   [name] performed a more Mini-Mental Status Examination today he stopped the first 2 of the serial sevens. Accessed  again excluding the handwriting since [name]  is impaired through his brainstem stroke, as is drawing , but he was able to place the hands into the clock face correctly.  There is a normal attention span & concentration ability. Speech  dysarthria, dysphonia and  Aphasia  Mood and affect are appropriate.  Cranial nerves: Pupils are unequal but briskly reactive to light. He has lost the right peripheral vision, stereo vision is impaired -  He cannot drive , no diplopia.  Hearing to finger rub intact. Facial sensation intact to fine touch. Facial motor strength is symmetric and tongue and uvula move midline. Tongue protrusion into either cheek is normal! Shoulder shrug is normal.  Gait and station: Patient walks with a cane , needs help with getting up, rises with assistance.  Has a brace on the right foot, AFT.  Deep tendon reflexes: clonic-  brisk in the right .Babinski maneuver response is upgoing right .  Assessment:   40 minute vist with more than 50% of the face to face time dedicated to discussion of physical abilities, stable cognition,  Improved over last visit Fall prevention.  Needs for daughter to r take health care and in  home care over. Wife severly ill.  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   Cognitive impairment not progressed .  Short term memory loss. memory impairment, constitutes dementia. He cannot be measured on MOCA due to vision and handwriting impairment. Mini-Mental Status Examination revealed today 22 out of 30 points which is a slow  progression by score. Beginning of dementia ?  Depression is present, but hs behavior has improved. Less agitated and less angry on welll butrin. More energy. .The geriatric depression score was endorsed at 2 , down from 7 points which was very indicative of depression  [place] of his pontine/ brain stem stroke from 2005   Plan:   [name] sometimes forgets to take his medication and I suggested to place it on in a pillbox  (perhaps one of those that have an automatic alarm).  His wife cannot longer care for him after failing peritoneal dialysis. She is now on hemodialysis and cannot longer safely ambulate or drive. This affected his care.   He is not longer taking Namenda for cognitive impairment status post cerebrovascular insult. In addition he takes an antidepressive and I like for him to have something that is stimulating as well; as an antidepressivant,  Wellbutrin was chosen.   He has not a history of seizures.  He doesn't have any history of cardiac irregularities.  He has home health now, his daughter, Dennis HedgeDena Guo, will take care over and needs FMLA.    Porfirio Mylararmen Rafia Shedden MD   Cc Dr Donette LarryHusain, Cena BentonKarar  06/02/2016

## 2016-06-03 DIAGNOSIS — Z0289 Encounter for other administrative examinations: Secondary | ICD-10-CM

## 2016-08-23 ENCOUNTER — Other Ambulatory Visit: Payer: Self-pay | Admitting: Neurology

## 2016-08-23 DIAGNOSIS — I7774 Dissection of vertebral artery: Secondary | ICD-10-CM

## 2016-08-23 DIAGNOSIS — F0280 Dementia in other diseases classified elsewhere without behavioral disturbance: Secondary | ICD-10-CM

## 2016-08-23 DIAGNOSIS — F0393 Unspecified dementia, unspecified severity, with mood disturbance: Secondary | ICD-10-CM

## 2016-08-23 DIAGNOSIS — F329 Major depressive disorder, single episode, unspecified: Principal | ICD-10-CM

## 2016-08-23 DIAGNOSIS — G3184 Mild cognitive impairment, so stated: Secondary | ICD-10-CM

## 2016-11-02 ENCOUNTER — Telehealth: Payer: Self-pay | Admitting: Neurology

## 2016-11-02 NOTE — Telephone Encounter (Signed)
Patients daughter Annabelle HarmanDana (listed on DPR) has called office requesting a letter stating for her fathers bank that she is able to access his money, due to him not being able to do it alone.  Daughter is patients POA.  Please call

## 2016-11-03 NOTE — Telephone Encounter (Signed)
Daughter gave additional information to add, so letter will be re-written. I will call her when it is ready.

## 2016-11-03 NOTE — Telephone Encounter (Signed)
Daughter is here in the lobby. Dr. Vickey Hugerohmeier is ok with providing this letter. Waiting for signature now.

## 2016-11-04 NOTE — Telephone Encounter (Signed)
I spoke to daughter this morning and let her know that the letter is ready for pick up.

## 2016-12-02 ENCOUNTER — Ambulatory Visit (INDEPENDENT_AMBULATORY_CARE_PROVIDER_SITE_OTHER): Payer: Medicare Other | Admitting: Neurology

## 2016-12-02 ENCOUNTER — Encounter: Payer: Self-pay | Admitting: Neurology

## 2016-12-02 VITALS — BP 139/80 | HR 99 | Resp 20 | Ht 70.0 in | Wt 164.0 lb

## 2016-12-02 DIAGNOSIS — F0393 Unspecified dementia, unspecified severity, with mood disturbance: Secondary | ICD-10-CM

## 2016-12-02 DIAGNOSIS — I7774 Dissection of vertebral artery: Secondary | ICD-10-CM

## 2016-12-02 DIAGNOSIS — F329 Major depressive disorder, single episode, unspecified: Secondary | ICD-10-CM | POA: Diagnosis not present

## 2016-12-02 DIAGNOSIS — M21371 Foot drop, right foot: Secondary | ICD-10-CM | POA: Insufficient documentation

## 2016-12-02 DIAGNOSIS — I69351 Hemiplegia and hemiparesis following cerebral infarction affecting right dominant side: Secondary | ICD-10-CM | POA: Diagnosis not present

## 2016-12-02 DIAGNOSIS — G3184 Mild cognitive impairment, so stated: Secondary | ICD-10-CM | POA: Diagnosis not present

## 2016-12-02 DIAGNOSIS — F0280 Dementia in other diseases classified elsewhere without behavioral disturbance: Secondary | ICD-10-CM

## 2016-12-02 MED ORDER — BUPROPION HCL ER (XL) 150 MG PO TB24: ORAL_TABLET | ORAL | 5 refills | Status: DC

## 2016-12-02 NOTE — Progress Notes (Signed)
Provider:  Melvyn Graham, M D  Referring Provider: [name], MD Primary Care Physician:  Dennis Housekeeper, MD  Chief Complaint  Patient presents with  . Follow-up    memory    HPI: Interval history from 2016-12-29. [name] died in early 03/08/18and [name] has now moved in with his daughter [name]. She reports that he is doing well with the changes in environment and he does look well groomed and well fed and alert. We performed today Mini-Mental Status Examination in which she scored 26 out of 30 points this has to be seen in context with his right hand dominant hemiparesis, he is still able to draw also with a tremor, he was able to draw clock face and right a sentence. He generated 7 Wirtz. I would therefore adjust his test to 27 out of 30 points. [name] has noticed that her father snores horribly, and he reports feeling sleepy all the time. 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 I will order a sleep test for him. His right foot needs a new AFT, and he developed some calluses. I will send him to a PT evaluation and take their recommendations to [facility].      [name] is a 76 y.o. male  Is seen here after a prolonged time, I used to follow this stroke patient during his hospitalization 10 years ago. His visit today was arranged by his wife, his PCP is [name], [name] was referred for a memory evaluation .Because of his history of a complicated CVA ,brain stem stroke syndromes and hemiparesis , a vascular effect has to be taken into account. [name] father had late onset dementia and personality changes and [name] sees some parallels in his behavior. He has become more aggressive, easier agitated, which was not part of his personality before this year. The couple celebreated their [name] Anniversary in 2014,  active in the [employer] in [place] . Mr.  Graham performs household chores and the couple spends time with the grandchildren now 87 and 1.  He has become impulsive, and sometimes" downright mean ". He still uses a urinary cath. Has right hand weakness , leg stiffness, left sensory loss, right facial weakness -hold cane in the left hand. Dysphonia, aphasia.  Mr Graham is falling more often, his skin is scabbed. He fell rising while pulling up on his cane. His walker has a seat and is used outside.  His other cane will have 3 prongs. He sleeps a lot. His wife started peritoneal dialysis.  2 times a day. Needs to wear a diaper for bowel accident. Is very happy with Dr. Abel Graham.  She has a sleep apnea evaluation tonight. [name] snores terribly, even before his brainstem stroke, dependent on supine position.   I have  the pleasure today on 06/02/2016 to see [name] again, today in the presence of his daughter [name]. [name] had become extremely daytime sleepy over the last couple of months while his wife's health has declined to the level where she could not safely ambulate for longer distance, and she is probably no longer safe to drive. She failed peritoneal dialysis, became extremely dehydrated and was insufficiently dialyzed. She has not a switch to hemodialysis but still is not thriving. For this reason the novel take over the organization of home health care which she initiated already.  And also the healthcare power of attorney.She has requested FMLA. In the meantime home health care has been of significant benefit for him. He is eating more regular meals, he is leaving the bed at a certain time each morning. He does not need help with dressing. He is walking with a cane. He is alert oriented and able to answer my questions. He is getting daily physical therapy by home health. He has improved his right hemi-spasticity, he is using her single-prong cane. He could raise today from a seated position without assistance of another  person.   Review of Systems: Out of a complete 14 system review, the patient complains of only the following symptoms, and all other reviewed systems are negative. He has never been a reader, watches TV , sleeps a lot.  Aphasia, dysarthria, spastic hemiparesis. Right dominant side.    Social History   Social History  . Marital status: Married    Spouse name: [name]  . Number of children: 2  . Years of education: 12   Occupational History  . Not on file.   Social History Main Topics  . Smoking status: Never Smoker  . Smokeless tobacco: Never Used  . Alcohol use No  . Drug use: No  . Sexual activity: Not on file   Other Topics Concern  . Not on file   Social History Narrative   Patient is married [name]) and lives at home with his wife.   Patient is disabled.    Patient has two adult children and two grandchildren.   Patient has a high school education.   Patient is left-handed.   Patient drinks one cup of coffee daily.             Family History  Problem Relation Age of Onset  . Colon cancer Brother     Past Medical History:  Diagnosis Date  . CKD (chronic kidney disease), stage II   . CVA (cerebral infarction)    Left thalamic, right-sided weakness wih aphagia, right leg brace  . Dyslipidemia   . Elevated PSA    biopsy negative in 2008  . GERD (gastroesophageal reflux disease)   . Hypertension    without medication since 2006  . Major depression   . Seasonal rhinitis     Past Surgical History:  Procedure Laterality Date  . NASAL SEPTUM SURGERY    . VASECTOMY      Current Outpatient Prescriptions  Medication Sig Dispense Refill  . amLODipine (NORVASC) 2.5 MG tablet Take 2.5 mg by mouth daily.    Marland Kitchen aspirin 81 MG tablet Take 81 mg by mouth daily.    Marland Kitchen buPROPion (WELLBUTRIN XL) 150 MG 24 hr tablet TAKE 1 TABLET(150 MG) BY MOUTH DAILY 30 tablet 5  . Cholecalciferol (VITAMIN D3) 2000 units TABS Take 2,000 Units by mouth.    Tilman Neat EXTRACT PO Take  450 mg by mouth daily.    . DULoxetine (CYMBALTA) 60 MG capsule Take 60 mg by mouth daily.    . Glucosamine-Chondroit-Vit C-Mn (GLUCOSAMINE CHONDR 1500 COMPLX) CAPS Take 1 capsule by mouth daily.    . simvastatin (ZOCOR) 40 MG tablet Take 40 mg by mouth daily.     No current facility-administered medications for this visit.     Allergies as of 12/02/2016  . (No Known Allergies)    Vitals: BP 139/80   Pulse 99   Resp 20   Ht 5\' 10"  (1.778 m)   Wt 164 lb (74.4 kg)  BMI 23.53 kg/m  Last Weight:  Wt Readings from Last 1 Encounters:  12/02/16 164 lb (74.4 kg)   Last Height:   Ht Readings from Last 1 Encounters:  12/02/16 5\' 10"  (1.778 m)   Montreal Cognitive Assessment  05/31/2014  Visuospatial/ Executive (0/5) 3  Naming (0/3) 3  Attention: Read list of digits (0/2) 2  Attention: Read list of letters (0/1) 1  Attention: Serial 7 subtraction starting at 100 (0/3) 3  Language: Repeat phrase (0/2) 2  Language : Fluency (0/1) 1  Abstraction (0/2) 2  Delayed Recall (0/5) 0  Orientation (0/6) 4  Total 21   MMSE - Mini Mental State Exam 12/02/2016 06/02/2016 12/29/2015  Orientation to time 3 4 1   Orientation to Place 5 5 5   Registration 3 3 3   Attention/ Calculation 4 1 3   Recall 3 2 2   Language- name 2 objects 2 2 2   Language- repeat 1 1 1   Language- follow 3 step command 3 3 3   Language- read & follow direction 1 1 1   Write a sentence 1 1 1   Copy design 0 1 1  Total score 26 24 23     Physical exam:  General: The patient is awake, alert and appears not in acute distress. The patient is well groomed. Head: Normocephalic, atraumatic. Neck is supple. Mallampati 2 neck circumference: 14.5  Cardiovascular:  Regular rate and rhythm , without  murmurs or carotid bruit, and without distended neck veins. Respiratory: Lungs are clear to auscultation. Skin:  Without evidence of edema, or rash.  Trunk: Neurologic exam : The patient is awake and alert, oriented to place and  time.   [name] performed a more Mini-Mental Status Examination today he stopped the first 2 of the serial sevens. Accessed  again excluding the handwriting since [name]  is impaired through his brainstem stroke, as is drawing , but he was able to place the hands into the clock face correctly.  There is a normal attention span & concentration ability. Speech dysarthria, dysphonia- Mood and affect are appropriate.  Cranial nerves: Pupils are unequal but briskly reactive to light. He has lost the right peripheral vision, stereo vision is impaired -  He cannot drive . Has no diplopia. The patient is able to direct his gaze left and right but not up or down. 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 Hearing to finger rub intact. Facial sensation intact to fine touch. Facial motor strength is symmetric and tongue and uvula move midline. Tongue protrusion into either cheek is normal! Shoulder shrug is normal.  Gait and station: Patient walks today with her single-prong cane, he wears his AFT which he has had since a stroke 13 years ago. He has developed a callus on the right foot. I agree with his daughter that he should have a physical therapy evaluation and see what he truly needs at this point. And that may be some rehabilitation exercises that could still bring better mobility to the right lower extremity and a new AFT has to be manufactured according to his needs and made to measure.  Deep tendon reflexes: clonic-  brisk in the right .Babinski maneuver response is upgoing right .  Assessment:  30 minute vist with more than 50% of the face to face time dedicated to discussion of physical abilities, stable cognition,  Improved over last visit Fall prevention.  Needs for daughter to  take health care over. 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   Cognitive impairment not progressed .  Short term memory loss. memory impairment, constitutes dementia. He cannot be measured on MOCA due to vision and handwriting impairment. Mini-Mental Status Examination revealed today 26 out of 30 points which is higher than the 22-30 3 month ago.   Depression is present, but hs behavior has improved. Less agitated and less angry on well butrin. More energy. The geriatric depression score was endorsed at 2 , down from 7 points which was very indicative of depression  [place] of his pontine/ brain stem stroke from 2005 - including right hemiparesis, right foot drop.   Plan:   Memory improved , perhaps due to a more stimulating environment.  Depression improved , He takes an antidepressive and I like for him to have something that is stimulating as well; as an antidepressivant,  Wellbutrin was chosen.  He has not a history of seizures.  He doesn't have any history of cardiac irregularities.  No longer needs health care in home- He has moved and  lives with his daughter, Harold HedgeDena Graham, who will take care over and needs FMLA.  Pt order: Rehabilitation exercises that could still bring better mobility to the right lower extremity and a new AFT has to be manufactured according to his needs and made to measure.   Porfirio Mylararmen Eloise Picone MD   Cc Dr Donette LarryHusain, Cena BentonKarar  12/02/2016

## 2016-12-20 ENCOUNTER — Ambulatory Visit: Payer: 59 | Admitting: Physical Therapy

## 2016-12-28 ENCOUNTER — Ambulatory Visit: Payer: 59 | Attending: Neurology

## 2016-12-28 DIAGNOSIS — R42 Dizziness and giddiness: Secondary | ICD-10-CM | POA: Diagnosis present

## 2016-12-28 DIAGNOSIS — I69353 Hemiplegia and hemiparesis following cerebral infarction affecting right non-dominant side: Secondary | ICD-10-CM | POA: Diagnosis not present

## 2016-12-28 DIAGNOSIS — R2689 Other abnormalities of gait and mobility: Secondary | ICD-10-CM

## 2016-12-28 NOTE — Therapy (Signed)
[facility] [address] [place], [place], [address] Phone: [phone]   Fax:  [phone]  Physical Therapy Evaluation  Patient Details  Name: [name] MRN: [id number] Date of Birth: 1941/09/01 Referring Provider: Dr. Vickey Graham  Encounter Date: 12/28/2016      PT End of Session - 12/28/16 1709    Visit Number 1   Number of Visits 17   Date for PT Re-Evaluation 02/26/17   Authorization Type [employer] Medicare: G-CODE AND PROGRESS NOTE EVERY 10 VISITS.    PT Start Time 1019   PT Stop Time 1101   PT Time Calculation (min) 42 min   Equipment Utilized During Treatment --  min guard to min A   Activity Tolerance Patient tolerated treatment well   Behavior During Therapy WFL for tasks assessed/performed      Past Medical History:  Diagnosis Date  . CKD (chronic kidney disease), stage II   . CVA (cerebral infarction)    Left thalamic, right-sided weakness wih aphagia, right leg brace  . Dyslipidemia   . Elevated PSA    biopsy negative in 2008  . GERD (gastroesophageal reflux disease)   . Hypertension    without medication since 2006  . Major depression   . Seasonal rhinitis     Past Surgical History:  Procedure Laterality Date  . NASAL SEPTUM SURGERY    . VASECTOMY      There were no vitals filed for this visit.       Subjective Assessment - 12/28/16 1030    Subjective Pt presented s/p L pontine CVA (13 years ago). Pt states he falls backwards when walking or standing up, balance impairments began approx. 6 months ago but he's had issues since CVA. Pt would like a new R AFO, he's on his second one since CVA and this one is now causing calluses. Pt had a 1-2 month period of inactivity, as his wife was sick and then passed away. STS txfs have been difficult. Pt has experienced 3 falls in the last 6 months, when transferring from bed to commode and standing up from couch. Pt reports he feels like R knee is going  to buckle.    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 No/denies            [facility] PT Assessment - 12/28/16 1037      Assessment   Medical Diagnosis R foot drop, hemiparesis affects R side as late effect of CVA   Referring Provider [name]   Onset Date/Surgical Date 07/30/16  last six months (since last HHPT)   Hand Dominance Left   Prior Therapy HHPT     Precautions   Precautions Fall     Restrictions   Weight Bearing Restrictions No     Balance Screen   Has the patient fallen in the past 6 months Yes   How many times? 3   Has the patient had a decrease in activity level because of a fear of falling?  No   Is the patient reluctant to leave their home because of a fear of falling?  Yes     Home Environment   Living Environment Private residence   Living Arrangements Children  Available Help at Discharge Family   Type of Home House   Home Access Stairs to enter   Entrance Stairs-Number of Steps 1   Entrance Stairs-Rails Right   Home Layout One level   Home Equipment [place] - single point;Walker - 4 wheels;Bedside commode;Wheelchair - manual;Grab bars - toilet;Grab bars - tub/shower;Shower seat - built in     Prior Function   Level of Independence Independent with household mobility with device;Independent with community mobility with device   Vocation Retired   Leisure Walking with dtr, watch TV, go to movies, play board games     Cognition   Overall Cognitive Status History of cognitive impairments - at baseline     Observation/Other Assessments   Focus on Therapeutic Outcomes (FOTO)  ABC: 37.5%-scores closer to 100% indicate complete confidence in balance.     Sensation   Light  Touch Appears Intact   Additional Comments Pt denied N/T     Coordination   Gross Motor Movements are Fluid and Coordinated No   Fine Motor Movements are Fluid and Coordinated No   Coordination and Movement Description Decr. speed with RUE/LE.     Posture/Postural Control   Posture/Postural Control Postural limitations   Postural Limitations Forward head;Rounded Shoulders     Tone   Assessment Location Right Lower Extremity     ROM / Strength   AROM / PROM / Strength AROM;Strength     AROM   Overall AROM  Deficits   Overall AROM Comments LUE/LE WFL. R shoulder limited to approx. 120 degrees and R ankle DF limited to approx. 5 degrees. Pt able to obtain active knee ext after several attempts.     Strength   Overall Strength Deficits   Overall Strength Comments Decr. R UE strength, L UE/LE WFL (grossly 4+/5). RLE grossly: 4/5, except for ankle DF 2/5.     Transfers   Transfers Sit to Stand;Stand to Sit   Sit to Stand 5: Supervision;With upper extremity assist;From chair/3-in-1   Stand to Sit 5: Supervision;With upper extremity assist;To chair/3-in-1     Ambulation/Gait   Ambulation/Gait Yes   Ambulation/Gait Assistance 4: Min guard;5: Supervision;4: Min assist   Ambulation/Gait Assistance Details Min A during LOB while performing turn. Incr. RLE spasticity during amb.    Ambulation Distance (Feet) 100 Feet   Assistive device Straight cane   Gait Pattern Step-through pattern;Decreased stride length;Decreased arm swing - right;Decreased dorsiflexion - right;Decreased hip/knee flexion - right;Right hip hike;Decreased trunk rotation  R foot/LE IR   Ambulation Surface Level;Indoor   Gait velocity 1.63ft/sec.     Balance   Balance Assessed Yes     Static Standing Balance   Static Standing - Balance Support No upper extremity supported   Static Standing - Level of Assistance 4: Min assist;Other (comment)  min guard   Static Standing - Comment/# of Minutes Min guard during feet  apart/together with eyes open and eyes closed. Min A during B SLS.     RLE Tone   RLE Tone Modified Ashworth     RLE Tone   Modified Ashworth Scale for Grading Hypertonia RLE Slight increase in muscle tone, manifested by a catch, followed by minimal resistance throughout the remainder (less than half) of the ROM                           PT Education - 12/28/16 1709    Education provided Yes   Education Details PT discussed exam  findings, POC, frequency and duration. PT discussed outcome measures.    Person(s) Educated Patient;Child(ren)   Methods Explanation   Comprehension Verbalized understanding          PT Short Term Goals - 12/28/16 1721      PT SHORT TERM GOAL #1   Title Pt will be IND in HEP to improve balance, gait, strength, and flexibility. TARGET DATE FOR ALL STGS: 01/25/17   Status New     PT SHORT TERM GOAL #2   Title Perform BERG and write STG and LTGs.   Status New     PT SHORT TERM GOAL #3   Title Pt will improve gait speed to >/=2.46ft/sec. with LRAD to safely amb. in the community.    Status New     PT SHORT TERM GOAL #4   Title Pt will amb. 500' over even terrain at MOD I level with LRAD to improve functional mobility.    Status New           PT Long Term Goals - 12/28/16 1723      PT LONG TERM GOAL #1   Title Pt will verbalize understanding of risk factors/signs/symptoms of CVA to reduce risk of add'l CVA. TARGET DATE FOR ALL LTGS: 02/22/17   Status New     PT LONG TERM GOAL #2   Title Pt will amb. 700' over even/uneven terrain with LRAD, at MOD I level, to perform yard work safely.    Status New     PT LONG TERM GOAL #3   Title Pt will report zero falls in last 4 weeks to improve safety.   Status New               Plan - 12/28/16 1710    Clinical Impression Statement Pt is a pleasant 75y/o male presenting to OPPT neuro with R hemiparesis s/p CVA 13 years ago. Pt would like a new AFO, as his is now causing  calluses but denied pain. 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 Pt's strength, decr. in R LE but WFL to amb, however, pt noted to experience incr. in spasticity during gait affecting his balance and ability to amb. Pt also noted to experience synergy pattern in RUE during gait and would benefit from OT eval to address ADLs and UE fucntion. Pt's gait speed indicates pt is not able to safely amb. in the community and is at a risk for falls. The following impairments were found during exam: gait deviations, spasticity, decr. strength, decr. coordination, decr. endurance, impaired flexibility and postural dysfunction. PT will formally assess balance next session, was limited today 2/2 time constraints. Pt would benefit from skilled PT to improve safety during functional mobility and to obtain a new AFO.    Rehab Potential Good   Clinical Impairments Affecting Rehab Potential see PMH, and CVA 13 years ago   PT Frequency 2x / week   PT Duration 8 weeks   PT Treatment/Interventions ADLs/Self Care Home Management;Biofeedback;Canalith Repostioning;Electrical Stimulation;Neuromuscular re-education;Balance training;Therapeutic exercise;Therapeutic activities;Manual techniques;Functional mobility training;Stair training;Orthotic Fit/Training;Gait training;DME Instruction;Patient/family education;Vestibular   PT Next Visit Plan Perform BERG and write goal, assess R ankle without hinge joint AFO donned, initiate balance/strength/flexibility HEP. CVA ed.   Recommended Other Services OT eval   Consulted and Agree with Plan of Care Patient;Family member/caregiver   Family Member Consulted dtr: Gae Dry  Patient will benefit from skilled therapeutic intervention in order to improve the following deficits and impairments:  Decreased endurance,  Abnormal gait, Decreased knowledge of use of DME, Decreased balance, Decreased mobility, Dizziness, Impaired flexibility, Postural dysfunction, Decreased coordination, Decreased strength, Impaired tone (intermittent dizziness)  Visit Diagnosis: Hemiplegia and hemiparesis following cerebral infarction affecting right non-dominant side (HCC) - Plan: PT plan of care cert/re-cert  Other abnormalities of gait and mobility - Plan: PT plan of care cert/re-cert  Dizziness and giddiness - Plan: PT plan of care cert/re-cert      G-Codes - 12-29-16 1725    Functional Assessment Tool Used (Outpatient Only) Gait speed: 1.5ft/sec with SPC, ABC: 37.5%; min A during B SLS   Functional Limitation Mobility: Walking and moving around   Mobility: Walking and Moving Around Current Status (A2130) At least 60 percent but less than 80 percent impaired, limited or restricted   Mobility: Walking and Moving Around Goal Status [id number]) At least 20 percent but less than 40 percent impaired, limited or restricted       Problem List Patient Active Problem List   Diagnosis Date Noted  . 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 Right foot drop 12/02/2016  . 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 Depression due to dementia 01/28/2015  . 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 Hemiplegia following CVA (cerebrovascular accident) (HCC) 05/31/2014  . 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 Left pontine stroke (HCC) 05/31/2014  . MCI (mild cognitive impairment) with memory loss 05/31/2014    [name] L 12-29-2016, 5:26 PM  [facility] [address] [place], [place], [address] Phone: [phone]   Fax:  [phone]  Name: [name] MRN: [id number] Date of  Birth: 06/05/1941  Zerita Boers, PT,DPT Dec 29, 2016 5:26 PM Phone: [phone] Fax: [phone]

## 2017-01-04 ENCOUNTER — Ambulatory Visit: Payer: 59 | Attending: Neurology

## 2017-01-04 DIAGNOSIS — R42 Dizziness and giddiness: Secondary | ICD-10-CM

## 2017-01-04 DIAGNOSIS — I69353 Hemiplegia and hemiparesis following cerebral infarction affecting right non-dominant side: Secondary | ICD-10-CM | POA: Diagnosis present

## 2017-01-04 DIAGNOSIS — R2681 Unsteadiness on feet: Secondary | ICD-10-CM | POA: Diagnosis present

## 2017-01-04 DIAGNOSIS — R2689 Other abnormalities of gait and mobility: Secondary | ICD-10-CM | POA: Diagnosis present

## 2017-01-04 DIAGNOSIS — R293 Abnormal posture: Secondary | ICD-10-CM | POA: Diagnosis present

## 2017-01-04 NOTE — Patient Instructions (Signed)
Hypotension As your heart beats, it forces blood through your body. This force is called blood pressure. If you have hypotension, you have low blood pressure. When your blood pressure is too low, you may not get enough blood to your brain. You may feel weak, feel light-headed, have a fast heartbeat, or even pass out (faint). Follow these instructions at home: Eating and drinking  Drink enough fluids to keep your pee (urine) clear or pale yellow.  Eat a healthy diet, and follow instructions from your doctor about eating or drinking restrictions. A healthy diet includes: ? Fresh fruits and vegetables. ? Whole grains. ? Low-fat (lean) meats. ? Low-fat dairy products.  Eat extra salt only as told. Do not add extra salt to your diet unless your doctor tells you to.  Eat small meals often.  Avoid standing up quickly after you eat. Medicines  Take over-the-counter and prescription medicines only as told by your doctor. ? Follow instructions from your doctor about changing how much you take (the dosage) of your medicines, if this applies. ? Do not stop or change your medicine on your own. General instructions  Wear compression stockings as told by your doctor.  Get up slowly from lying down or sitting.  Avoid hot showers and a lot of heat as told by your doctor.  Return to your normal activities as told by your doctor. Ask what activities are safe for you.  Do not use any products that contain nicotine or tobacco, such as cigarettes and e-cigarettes. If you need help quitting, ask your doctor.  Keep all follow-up visits as told by your doctor. This is important. Contact a doctor if:  You throw up (vomit).  You have watery poop (diarrhea).  You have a fever for more than 2-3 days.  You feel more thirsty than normal.  You feel weak and tired. Get help right away if:  You have chest pain.  You have a fast or irregular heartbeat.  You lose feeling (get numbness) in any part  of your body.  You cannot move your arms or your legs.  You have trouble talking.  You get sweaty or feel light-headed.  You faint.  You have trouble breathing.  You have trouble staying awake.  You feel confused. This information is not intended to replace advice given to you by your health care provider. Make sure you discuss any questions you have with your health care provider. Document Released: 11/17/2009 Document Revised: 05/11/2016 Document Reviewed: 05/11/2016 Elsevier Interactive Patient Education  2017 Elsevier Inc.   

## 2017-01-04 NOTE — Therapy (Signed)
[facility] [address] [place], [place], [address] Phone: [phone]   Fax:  [phone]  Physical Therapy Treatment  Patient Details  Name: [name] MRN: [id number] Date of Birth: 1941-02-26 Referring Provider: Dr. Vickey Huger  Encounter Date: 01/04/2017      PT End of Session - 01/04/17 1519    Visit Number 2   Number of Visits 17   Date for PT Re-Evaluation 02/26/17   Authorization Type UHC Medicare: G-CODE AND PROGRESS NOTE EVERY 10 VISITS.    PT Start Time 1452   PT Stop Time 1530   PT Time Calculation (min) 38 min   Equipment Utilized During Treatment Gait belt   Activity Tolerance Patient tolerated treatment well   Behavior During Therapy WFL for tasks assessed/performed      Past Medical History:  Diagnosis Date  . CKD (chronic kidney disease), stage II   . CVA (cerebral infarction)    Left thalamic, right-sided weakness wih aphagia, right leg brace  . Dyslipidemia   . Elevated PSA    biopsy negative in 2008  . GERD (gastroesophageal reflux disease)   . Hypertension    without medication since 2006  . Major depression   . Seasonal rhinitis     Past Surgical History:  Procedure Laterality Date  . NASAL SEPTUM SURGERY    . VASECTOMY      There were no vitals filed for this visit.      Subjective Assessment - 01/04/17 1455    Subjective Pt reported both ears feel like they need to pop, it started this morning. Pt denied congestion or falls since last visit.    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 No/denies                Vestibular Assessment - 01/04/17 1522      Orthostatics   BP supine (x 5 minutes) 138/87   HR supine (x 5 minutes) 75   BP  sitting 141/84  no c/o dizziness upon sitting upright   HR sitting 81   BP standing (after 1 minute) 121/82   HR standing (after 1 minute) 86   Orthostatics Comment Pt reported 1/10 dizziness upon standing upright.                 [facility] Adult PT Treatment/Exercise - 01/04/17 1457      Standardized Balance Assessment   Standardized Balance Assessment Berg Balance Test     Berg Balance Test   Sit to Stand Able to stand without using hands and stabilize independently   Standing Unsupported Able to stand 2 minutes with supervision   Sitting with Back Unsupported but Feet Supported on Floor or Stool Able to sit safely and securely 2 minutes   Stand to Sit Sits safely with minimal use of hands   Transfers Able to transfer with verbal cueing and /or supervision   Standing Unsupported with Eyes Closed Able to stand 10 seconds with supervision   Standing Ubsupported with Feet Together Able to place feet together independently and stand for 1 minute with supervision   From Standing, Reach Forward with Outstretched Arm Can reach forward >12 cm safely (5")  6" with RUE   From Standing Position, Pick up Object from Floor Able to  pick up shoe, needs supervision   From Standing Position, Turn to Look Behind Over each Shoulder Looks behind one side only/other side shows less weight shift   Turn 360 Degrees Able to turn 360 degrees safely but slowly  3/10 dizziness during turns, described as lightheadednes   Standing Unsupported, Alternately Place Feet on Step/Stool Needs assistance to keep from falling or unable to try   Standing Unsupported, One Foot in Front Able to take small step independently and hold 30 seconds   Standing on One Leg Unable to try or needs assist to prevent fall   Total Score 36                PT Education - 01/04/17 1518    Education provided Yes   Education Details PT discussed the importance of informing MD about B hearing issues, pt's dtr called during  PT session and scheduled an appt. with PCP to eval ears. PT educated pt on outcome meausure results. PT provided orthostatic hypotension handout and education. PT educated pt on safety and hand placement during STS txfs.    Person(s) Educated Patient;Spouse   Methods Explanation;Demonstration;Verbal cues;Handout   Comprehension Verbalized understanding;Returned demonstration          PT Short Term Goals - 01/04/17 1539      PT SHORT TERM GOAL #1   Title Pt will be IND in HEP to improve balance, gait, strength, and flexibility. TARGET DATE FOR ALL STGS: 01/25/17   Status New     PT SHORT TERM GOAL #2   Title Perform BERG and write STG and LTGs.   Status Achieved     PT SHORT TERM GOAL #3   Title Pt will improve gait speed to >/=2.54ft/sec. with LRAD to safely amb. in the community.    Status New     PT SHORT TERM GOAL #4   Title Pt will amb. 500' over even terrain at MOD I level with LRAD to improve functional mobility.    Status New     PT SHORT TERM GOAL #5   Title Pt will improve BERG score to >/=40/56 to decr. falls risk.    Status New           PT Long Term Goals - 01/04/17 1540      PT LONG TERM GOAL #1   Title Pt will verbalize understanding of risk factors/signs/symptoms of CVA to reduce risk of add'l CVA. TARGET DATE FOR ALL LTGS: 02/22/17   Status New     PT LONG TERM GOAL #2   Title Pt will amb. 700' over even/uneven terrain with LRAD, at MOD I level, to perform yard work safely.    Status New     PT LONG TERM GOAL #3   Title Pt will report zero falls in last 4 weeks to improve safety.   Status New     PT LONG TERM GOAL #4   Title Pt will improve BERG score to >/=44/56 to decr. falls risk.    Status New               Plan - 01/04/17 1520    Clinical Impression Statement Pt's BERG score indicates pt is at a high risk for falls. PT recommend pt f/u with PCP regarding hearing issues, with onset of this morning, as pt experiencing incr. LOB during  initial sit to stand txf in lobby. Pt experienced lightheadeness during BERG test, therefore, PT assessed pt for orthostatic hypotension and pt's systolic BP  did exerpience a signficant decr. during sit to stand txf and pt reported lightheadedness. Pt would continue to benefit from skilled PT to improve safety during functional mobility.    Rehab Potential Good   Clinical Impairments Affecting Rehab Potential see PMH, and CVA 13 years ago   PT Frequency 2x / week   PT Duration 8 weeks   PT Treatment/Interventions ADLs/Self Care Home Management;Biofeedback;Canalith Repostioning;Electrical Stimulation;Neuromuscular re-education;Balance training;Therapeutic exercise;Therapeutic activities;Manual techniques;Functional mobility training;Stair training;Orthotic Fit/Training;Gait training;DME Instruction;Patient/family education;Vestibular   PT Next Visit Plan Assess R ankle without hinge joint AFO donned, initiate balance/strength/flexibility HEP. Provide CVA ed.   Consulted and Agree with Plan of Care Patient;Family member/caregiver   Family Member Consulted [name]      Patient will benefit from skilled therapeutic intervention in order to improve the following deficits and impairments:  Decreased endurance, Abnormal gait, Decreased knowledge of use of DME, Decreased balance, Decreased mobility, Dizziness, Impaired flexibility, Postural dysfunction, Decreased coordination, Decreased strength, Impaired tone (intermittent dizziness)  Visit Diagnosis: Hemiplegia and hemiparesis following cerebral infarction affecting right non-dominant side (HCC)  Other abnormalities of gait and mobility  Dizziness and giddiness     Problem List Patient Active Problem List   Diagnosis Date Noted  . 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 Right foot drop 12/02/2016  . 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 Depression due to dementia  01/28/2015  . 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 Hemiplegia following CVA (cerebrovascular accident) (HCC) 05/31/2014  . 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 Left pontine stroke (HCC) 05/31/2014  . MCI (mild cognitive impairment) with memory loss 05/31/2014    Tamerra Merkley L 01/04/2017, 3:43 PM  [facility] [address] [place], [place], [address] Phone: [phone]   Fax:  [phone]  Name: [name] MRN: [id number] Date of Birth: 1941/03/02  Zerita Boers, PT,DPT 01/04/17 3:43 PM Phone: [phone] Fax: [phone]

## 2017-01-05 ENCOUNTER — Ambulatory Visit: Payer: 59

## 2017-01-05 DIAGNOSIS — R2689 Other abnormalities of gait and mobility: Secondary | ICD-10-CM

## 2017-01-05 DIAGNOSIS — I69353 Hemiplegia and hemiparesis following cerebral infarction affecting right non-dominant side: Secondary | ICD-10-CM

## 2017-01-05 DIAGNOSIS — R42 Dizziness and giddiness: Secondary | ICD-10-CM

## 2017-01-05 NOTE — Therapy (Signed)
[facility] [address] [place], [place], [address] Phone: [phone]   Fax:  [phone]  Physical Therapy Treatment  Patient Details  Name: [name] MRN: [id number] Date of Birth: 1940-09-18 Referring Provider: Dr. Brett Fairy  Encounter Date: 01/05/2017      PT End of Session - 01/05/17 1412    Visit Number 3   Number of Visits 17   Date for PT Re-Evaluation 02/26/17   Authorization Type UHC Medicare: G-CODE AND PROGRESS NOTE EVERY 10 VISITS.    PT Start Time 1318   PT Stop Time 1406   PT Time Calculation (min) 48 min   Activity Tolerance Patient tolerated treatment well   Behavior During Therapy WFL for tasks assessed/performed      Past Medical History:  Diagnosis Date  . CKD (chronic kidney disease), stage II   . CVA (cerebral infarction)    Left thalamic, right-sided weakness wih aphagia, right leg brace  . Dyslipidemia   . Elevated PSA    biopsy negative in 2008  . GERD (gastroesophageal reflux disease)   . Hypertension    without medication since 2006  . Major depression   . Seasonal rhinitis     Past Surgical History:  Procedure Laterality Date  . NASAL SEPTUM SURGERY    . VASECTOMY      There were no vitals filed for this visit.      Subjective Assessment - 01/05/17 1323    Subjective Pt denied falls since last visit. pt went to PCP yesterday and they cleaned out his ears and he can hear better today and balance is better per pt.    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 No/denies        Therex: HEP performed in seated and supine positions with cues and demo for technique. Performed with S to ensure safety. No  pain reported during therex. Please pt instructions for details.                 PT Education - 01/05/17 1411    Education provided Yes   Education Details PT provided pt with stretching and strenthening/ROM HEP.    Person(s) Educated Patient;Child(ren)   Methods Explanation;Demonstration;Tactile cues;Verbal cues;Handout   Comprehension Returned demonstration;Verbalized understanding          PT Short Term Goals - 01/04/17 1539      PT SHORT TERM GOAL #1   Title Pt will be IND in HEP to improve balance, gait, strength, and flexibility. TARGET DATE FOR ALL STGS: 01/25/17   Status New     PT SHORT TERM GOAL #2   Title Perform BERG and write STG and LTGs.   Status Achieved     PT SHORT TERM GOAL #3   Title Pt will improve gait speed to >/=2.69f/sec. with LRAD to safely amb. in the community.    Status New     PT SHORT TERM GOAL #4   Title Pt will amb. 500' over even terrain at MOD I level with LRAD to improve functional mobility.    Status New     PT SHORT TERM GOAL #5   Title Pt will improve BERG score to >/=40/56 to decr. falls risk.    Status  New           PT Long Term Goals - 01/05/17 1426      PT LONG TERM GOAL #1   Title Pt will verbalize understanding of risk factors/signs/symptoms of CVA to reduce risk of add'l CVA. TARGET DATE FOR ALL LTGS: 02/22/17   Status Achieved  01/05/17     PT LONG TERM GOAL #2   Title Pt will amb. 700' over even/uneven terrain with LRAD, at MOD I level, to perform yard work safely.    Status New     PT LONG TERM GOAL #3   Title Pt will report zero falls in last 4 weeks to improve safety.   Status New     PT LONG TERM GOAL #4   Title Pt will improve BERG score to >/=44/56 to decr. falls risk.    Status New               Plan - 01/05/17 1412    Clinical Impression Statement Pt tolerated HEP well, as he denied pain or discomfort. PT assessed pt's R ankle ROM and strength in seated position. Pt did experience  lightheadedness during supine to sit txf, which resolved with seated rest break. Pt's A/PROM ankle ROM was [facility] except for active ankle DF was lacking approx. 5 degrees from neutral, PT was able to get ankle DF PROM to neutral after stretches. Pt noted to have large callus on lateral side of R foot (base of 5th metatarsal). Pt denied pain at callus site. Pt would continue to benefit from skilled PT to improve safety during functional mobility.  Pt was able to verbalize risk factors and signs/symptoms of CVA,therefere, CVA goal met.    Rehab Potential Good   Clinical Impairments Affecting Rehab Potential see PMH, and CVA 13 years ago   PT Frequency 2x / week   PT Duration 8 weeks   PT Treatment/Interventions ADLs/Self Care Home Management;Biofeedback;Canalith Repostioning;Electrical Stimulation;Neuromuscular re-education;Balance training;Therapeutic exercise;Therapeutic activities;Manual techniques;Functional mobility training;Stair training;Orthotic Fit/Training;Gait training;DME Instruction;Patient/family education;Vestibular   PT Next Visit Plan Ambulate without AFO donned. initiate balance/strength HEP.    Consulted and Agree with Plan of Care Patient;Family member/caregiver   Family Member Consulted [name]      Patient will benefit from skilled therapeutic intervention in order to improve the following deficits and impairments:  Decreased endurance, Abnormal gait, Decreased knowledge of use of DME, Decreased balance, Decreased mobility, Dizziness, Impaired flexibility, Postural dysfunction, Decreased coordination, Decreased strength, Impaired tone (intermittent dizziness)  Visit Diagnosis: Hemiplegia and hemiparesis following cerebral infarction affecting right non-dominant side (HCC)  Other abnormalities of gait and mobility  Dizziness and giddiness     Problem List Patient Active Problem List   Diagnosis Date Noted  . Hemiparesis affecting right side as late effect of  cerebrovascular accident ([place]) 12/02/2016  . Right foot drop 12/02/2016  . 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 Depression due to dementia 01/28/2015  . Dissection of vertebral artery ([place]) 01/28/2015  . 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 Hemiplegia following CVA (cerebrovascular accident) ([place]) 05/31/2014  . 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 Left pontine stroke ([place]) 05/31/2014  . MCI (mild cognitive impairment) with memory loss 05/31/2014    , L 01/05/2017, 2:27 PM  [facility] [address] [place], [place], [address] Phone: [phone]   Fax:  [phone]  Name: [name] MRN: [id number] Date of Birth: 1940-12-11  Anderson [place]  Sabra Heck, PT,DPT 01/05/17 2:27 PM Phone: [phone] Fax: [phone]

## 2017-01-05 NOTE — Patient Instructions (Signed)
HIP: Hamstrings - Short Sitting    Rest leg on raised surface. Keep knee straight. Lift chest. Hold _30__ seconds. _3__ reps per set, _2-3__ sets per day, _7__ days per week  Copyright  VHI. All rights reserved.   Lower Trunk Rotation Stretch    Keeping back flat and feet together, rotate knees to left side. Hold __30__ seconds. Then rotate knees to right side and hold 30 seconds. Repeat __3__ times per set. Do __1__ sets per session. Do _2-3___ sessions per day.  http://orth.exer.us/122   Copyright  VHI. All rights reserved.   Piriformis (Supine)    Cross legs, right on top.  Push top knee away from body (gentle stretch). Hold __30__ seconds. Switch and repeat with other leg. Repeat __3_ times per set. Do __1__ sets per session. Do _2-3___ sessions per day.  http://orth.exer.us/676   Progression: Gently pull other knee toward chest until stretch is felt in buttock/hip of top leg. Copyright  VHI. All rights reserved.   Toe / Heel Raise (Sitting)    Sitting, raise heels, then rock back on heels and raise toes. Repeat __10__ times. Take breaks to make sure form is correct.   Copyright  VHI. All rights reserved.   Ankle Sideward Movement (Inversion / Eversion)    Sitting with feet on floor, keep knee still and bring foot out to the side. Return to resting position.  Repeat __10__ times. Do ___1_ sessions per day.  http://[email or web address].exer.us/405   Copyright  VHI. All rights reserved.

## 2017-01-06 NOTE — Progress Notes (Signed)
I agree with the assessment and plan as directed by PT. I know this patient and his condition.    Makayela Secrest, MD

## 2017-01-11 ENCOUNTER — Ambulatory Visit: Payer: 59 | Admitting: Physical Therapy

## 2017-01-11 ENCOUNTER — Encounter: Payer: Self-pay | Admitting: Physical Therapy

## 2017-01-11 DIAGNOSIS — I69353 Hemiplegia and hemiparesis following cerebral infarction affecting right non-dominant side: Secondary | ICD-10-CM

## 2017-01-11 DIAGNOSIS — R2689 Other abnormalities of gait and mobility: Secondary | ICD-10-CM

## 2017-01-12 NOTE — Therapy (Signed)
[facility] [address] [place], [place], [address] Phone: [phone]   Fax:  [phone]  Physical Therapy Treatment  Patient Details  Name: [name] MRN: [id number] Date of Birth: 03-Apr-1941 Referring Provider: Dr. Vickey Huger  Encounter Date: 01/11/2017      PT End of Session - 01/11/17 1454    Visit Number 4   Number of Visits 17   Date for PT Re-Evaluation 02/26/17   Authorization Type UHC Medicare: G-CODE AND PROGRESS NOTE EVERY 10 VISITS.    PT Start Time 1449   PT Stop Time 1530   PT Time Calculation (min) 41 min   Equipment Utilized During Treatment Gait belt   Activity Tolerance Patient tolerated treatment well   Behavior During Therapy WFL for tasks assessed/performed      Past Medical History:  Diagnosis Date  . CKD (chronic kidney disease), stage II   . CVA (cerebral infarction)    Left thalamic, right-sided weakness wih aphagia, right leg brace  . Dyslipidemia   . Elevated PSA    biopsy negative in 2008  . GERD (gastroesophageal reflux disease)   . Hypertension    without medication since 2006  . Major depression   . Seasonal rhinitis     Past Surgical History:  Procedure Laterality Date  . NASAL SEPTUM SURGERY    . VASECTOMY      There were no vitals filed for this visit.      Subjective Assessment - 01/11/17 1453    Subjective No new complaints. No falls or pain to report. Did have nose bleed on the way here, why they are running behind.    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 No/denies            [facility] Adult PT Treatment/Exercise - 01/11/17 1459      Transfers   Transfers Sit to Stand;Stand to Sit   Sit to Stand 5: Supervision;With upper extremity assist;From chair/3-in-1   Stand to Sit 5: Supervision;With upper extremity assist;To chair/3-in-1     Ambulation/Gait   Ambulation/Gait Yes   Ambulation/Gait Assistance 4: Min guard   Ambulation/Gait Assistance Details pt seen along side Chris from [place]. gait performed with pt's current brace: incr in extensor tone, IR at hip despite good pelvic alignment with toed in position. gait without brace: continues as with brace with added ankle instability, decr supination control with rolling out. trialed off shelf brace with T-strap, poor ankle control with continued tone noted. At this time the plan that was settled on was to adjust the ankle strap of current brace for improved ankle stability until it is determined if tone can be better managed before getting a new brace.                             Ambulation Distance (Feet) 100 Feet  x1, 40 x2   Assistive device Straight cane   Gait Pattern Step-through pattern;Decreased stride length;Decreased arm swing - right;Decreased dorsiflexion - right;Decreased hip/knee flexion - right;Right hip hike;Decreased trunk rotation   Ambulation Surface Level;Indoor            PT Short Term Goals - 01/04/17 1539  PT SHORT TERM GOAL #1   Title Pt will be IND in HEP to improve balance, gait, strength, and flexibility. TARGET DATE FOR ALL STGS: 01/25/17   Status New     PT SHORT TERM GOAL #2   Title Perform BERG and write STG and LTGs.   Status Achieved     PT SHORT TERM GOAL #3   Title Pt will improve gait speed to >/=2.2062ft/sec. with LRAD to safely amb. in the community.    Status New     PT SHORT TERM GOAL #4   Title Pt will amb. 500' over even terrain at MOD I level with LRAD to improve functional mobility.    Status New     PT SHORT TERM GOAL #5   Title Pt will improve BERG score to >/=40/56 to decr. falls risk.    Status New           PT Long Term Goals - 01/05/17 1426      PT  LONG TERM GOAL #1   Title Pt will verbalize understanding of risk factors/signs/symptoms of CVA to reduce risk of add'l CVA. TARGET DATE FOR ALL LTGS: 02/22/17   Status Achieved  01/05/17     PT LONG TERM GOAL #2   Title Pt will amb. 700' over even/uneven terrain with LRAD, at MOD I level, to perform yard work safely.    Status New     PT LONG TERM GOAL #3   Title Pt will report zero falls in last 4 weeks to improve safety.   Status New     PT LONG TERM GOAL #4   Title Pt will improve BERG score to >/=44/56 to decr. falls risk.    Status New             Plan - 01/11/17 1454    Clinical Impression Statement Today's skilled session was focused on orthotic consult with Thayer Ohmhris, orthotist [facility]. Pt presents with increased right LE tone with gait, causing IR at the hip for toed in gait and extensor tone. At this time it was decided to modify current brace until tone can be further addressed. Orthotist to move ankle strap to provide more supination control with current brace. Also discussed use of toe spreaders to assist with toe curling that occurs with gait. Daugther to look into this. PT to see pt next visit, ? if pt would benefit from botox or oral medication to address tone to allow for more beneficial brace to assist with gait and safety with mobility.                                              Rehab Potential Good   Clinical Impairments Affecting Rehab Potential see PMH, and CVA 13 years ago   PT Frequency 2x / week   PT Duration 8 weeks   PT Treatment/Interventions ADLs/Self Care Home Management;Biofeedback;Canalith Repostioning;Electrical Stimulation;Neuromuscular re-education;Balance training;Therapeutic exercise;Therapeutic activities;Manual techniques;Functional mobility training;Stair training;Orthotic Fit/Training;Gait training;DME Instruction;Patient/family education;Vestibular   PT Next Visit Plan continue to work on stretching of right LE with emphasis on  decreasing IR,  tone management; balance activites; strengthening of gluts, ER's to address decreased IR with gait/mobility.    Consulted and Agree with Plan of Care Patient;Family member/caregiver   Family Member Consulted [name]      Patient will benefit from skilled therapeutic intervention in  order to improve the following deficits and impairments:  Decreased endurance, Abnormal gait, Decreased knowledge of use of DME, Decreased balance, Decreased mobility, Dizziness, Impaired flexibility, Postural dysfunction, Decreased coordination, Decreased strength, Impaired tone (intermittent dizziness)  Visit Diagnosis: Hemiplegia and hemiparesis following cerebral infarction affecting right non-dominant side (HCC)  Other abnormalities of gait and mobility     Problem List Patient Active Problem List   Diagnosis Date Noted  . 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 Right foot drop 12/02/2016  . 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 Depression due to dementia 01/28/2015  . 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 Hemiplegia following CVA (cerebrovascular accident) (HCC) 05/31/2014  . 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 Left pontine stroke (HCC) 05/31/2014  . MCI (mild cognitive impairment) with memory loss 05/31/2014    Sallyanne Kuster, PTA, [facility] [address] [place], [place] [address] [phone] 01/12/17, 10:14 PM   Name: [name] MRN: [id number] Date of Birth: [birth year]

## 2017-01-13 ENCOUNTER — Ambulatory Visit: Payer: 59 | Admitting: Physical Therapy

## 2017-01-13 DIAGNOSIS — R2689 Other abnormalities of gait and mobility: Secondary | ICD-10-CM

## 2017-01-13 DIAGNOSIS — I69353 Hemiplegia and hemiparesis following cerebral infarction affecting right non-dominant side: Secondary | ICD-10-CM | POA: Diagnosis not present

## 2017-01-13 NOTE — Patient Instructions (Addendum)
Heel Raise: Bilateral (Standing)    Rise on balls of feet. Repeat __10__ times per set. Do __3__ sets per session. Do _2-3___ sessions per day.  HOLD UP 3 secs  http://orth.exer.us/38  WROTE out Runner's stretch and heel cord stretch in standing - 30 sec hold each stretch; 2x/day  Copyright  VHI. All rights reserved.

## 2017-01-14 ENCOUNTER — Telehealth: Payer: Self-pay | Admitting: Neurology

## 2017-01-14 DIAGNOSIS — M21371 Foot drop, right foot: Secondary | ICD-10-CM

## 2017-01-14 NOTE — Telephone Encounter (Signed)
Patients daughter Chaya JanDena called office in reference to needing a prescription for a new right leg brace patient has appointment on 01/25/17 with [facility] for adjustment.  Fax #- [phone] phone #[phone] Attn- Scarlette Slicehris Roura

## 2017-01-14 NOTE — Therapy (Signed)
[facility] [address] [place], [place], [address] Phone: [phone]   Fax:  [phone]  Physical Therapy Treatment  Patient Details  Name: [name] MRN: [id number] Date of Birth: [birth year] Referring Provider: Dr. Vickey Huger  Encounter Date: 01/13/2017      PT End of Session - 01/14/17 1510    Visit Number 5   Number of Visits 17   Date for PT Re-Evaluation 02/26/17   Authorization Type UHC Medicare: G-CODE AND PROGRESS NOTE EVERY 10 VISITS.    PT Start Time 1102   PT Stop Time 1150   PT Time Calculation (min) 48 min   Equipment Utilized During Treatment Gait belt      Past Medical History:  Diagnosis Date  . CKD (chronic kidney disease), stage II   . CVA (cerebral infarction)    Left thalamic, right-sided weakness wih aphagia, right leg brace  . Dyslipidemia   . Elevated PSA    biopsy negative in 2008  . GERD (gastroesophageal reflux disease)   . Hypertension    without medication since 2006  . Major depression   . Seasonal rhinitis     Past Surgical History:  Procedure Laterality Date  . NASAL SEPTUM SURGERY    . VASECTOMY      There were no vitals filed for this visit.      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 No/denies                         [facility] Adult PT Treatment/Exercise - 01/14/17 0001      Ambulation/Gait   Ambulation/Gait Yes   Ambulation/Gait Assistance 4: Min guard    Ambulation/Gait Assistance Details trial of air cast on RLE for assessment ; cues for pt to decr. speed and weight shift onto RLE prior to stepping with LLE   Ambulation Distance (Feet) 115 Feet   Assistive device Straight cane   Gait Pattern Step-through pattern;Decreased stride length;Decreased arm swing - right;Decreased dorsiflexion - right;Decreased hip/knee flexion - right;Right hip hike;Decreased trunk rotation   Ambulation Surface Level;Indoor   Gait Comments Air cast not supportive enough to hold Rt ankle/foot in neutral position due to severity of tone which causes supination and internal roation of RLE iduring gait      Exercises   Exercises Knee/Hip;Ankle     Knee/Hip Exercises: Stretches   Passive Hamstring Stretch Right;2 reps;20 seconds   Gastroc Stretch Right;1 rep;30 seconds  2" block used at counter to provide UE support in standing     Ankle Exercises: Standing   Heel Raises 10 reps  bil. feet on floor (AFO doffed)                  PT Short Term Goals - 01/04/17 1539      PT SHORT TERM GOAL #1   Title Pt will be IND in HEP to improve balance, gait, strength, and flexibility. TARGET DATE FOR ALL STGS:  01/25/17   Status New     PT SHORT TERM GOAL #2   Title Perform BERG and write STG and LTGs.   Status Achieved     PT SHORT TERM GOAL #3   Title Pt will improve gait speed to >/=2.2462ft/sec. with LRAD to safely amb. in the community.    Status New     PT SHORT TERM GOAL #4   Title Pt will amb. 500' over even terrain at MOD I level with LRAD to improve functional mobility.    Status New     PT SHORT TERM GOAL #5   Title Pt will improve BERG score to >/=40/56 to decr. falls risk.    Status New           PT Long Term Goals - 01/05/17 1426      PT LONG TERM GOAL #1   Title Pt will verbalize understanding of risk factors/signs/symptoms of CVA to reduce risk of add'l CVA. TARGET DATE FOR ALL LTGS: 02/22/17   Status Achieved  01/05/17     PT LONG  TERM GOAL #2   Title Pt will amb. 700' over even/uneven terrain with LRAD, at MOD I level, to perform yard work safely.    Status New     PT LONG TERM GOAL #3   Title Pt will report zero falls in last 4 weeks to improve safety.   Status New     PT LONG TERM GOAL #4   Title Pt will improve BERG score to >/=44/56 to decr. falls risk.    Status New               Plan - 01/14/17 1530    Clinical Impression Statement Pt able to externally rotate RLE during gait but needs cues to decr. speed, attempt initial Rt heel contact at stance and weight shift over onto RLE prior to stepping with LLE.  Air cast trialed as pt's main problem is supination of Rt foot and less foot drop , but air cast not nearly supportive enough to maintain Rt ankle in neutral position due to severity of tone/spasticity.   Rehab Potential Good   Clinical Impairments Affecting Rehab Potential see PMH, and CVA 13 years ago   PT Frequency 2x / week   PT Duration 8 weeks   PT Treatment/Interventions ADLs/Self Care Home Management;Biofeedback;Canalith Repostioning;Electrical Stimulation;Neuromuscular re-education;Balance training;Therapeutic exercise;Therapeutic activities;Manual techniques;Functional mobility training;Stair training;Orthotic Fit/Training;Gait training;DME Instruction;Patient/family education;Vestibular   PT Next Visit Plan give pics of heel cord stretch in standing and runner's stretch for RLE to add to HEP: cont with gait training   Consulted and Agree with Plan of Care Patient;Family member/caregiver   Family Member Consulted [name]      Patient will benefit from skilled therapeutic intervention in order to improve the following deficits and impairments:  Decreased endurance, Abnormal gait, Decreased knowledge of use of DME, Decreased balance, Decreased mobility, Dizziness, Impaired flexibility, Postural dysfunction, Decreased coordination, Decreased strength, Impaired tone  Visit  Diagnosis: Other abnormalities of gait and mobility     Problem List Patient Active Problem List   Diagnosis Date Noted  . 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 Right foot drop 12/02/2016  . 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 Depression due to dementia 01/28/2015  . 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 Hemiplegia following CVA (cerebrovascular accident) (HCC) 05/31/2014  . Cerebral  infarction due to basilar artery occlusion (HCC) 05/31/2014  . Left pontine stroke (HCC) 05/31/2014  . MCI (mild cognitive impairment) with memory loss 05/31/2014    Nautika Cressey, Donavan Burnet, PT 01/14/2017, 3:37 PM  [facility] [address] [place], [place], [address] Phone: [phone]   Fax:  [phone]  Name: Rodolph Hagemann MRN: [id number] Date of Birth: February 21, 1941

## 2017-01-17 NOTE — Addendum Note (Signed)
Addended by: Geronimo RunningINKINS, Montee Tallman A on: 01/17/2017 09:13 AM   Modules accepted: Orders

## 2017-01-17 NOTE — Telephone Encounter (Signed)
I called pt's daughter, Chaya JanDena, per DPR. I advised her that the order for AFO is ready. Pt's daughter asked that I send the order to [facility], AttnThayer Ohm: Chris, and verified that the fax to them is [phone]. Pt's daughter thanked me for this.

## 2017-01-17 NOTE — Telephone Encounter (Signed)
Needs new fitting for AFO right foot.  His old one is almost a decade old.  Foot has changed. Needs to have a prescription for  [facility]. CD

## 2017-01-18 ENCOUNTER — Ambulatory Visit: Payer: 59

## 2017-01-18 DIAGNOSIS — I69353 Hemiplegia and hemiparesis following cerebral infarction affecting right non-dominant side: Secondary | ICD-10-CM

## 2017-01-18 DIAGNOSIS — R2689 Other abnormalities of gait and mobility: Secondary | ICD-10-CM

## 2017-01-18 NOTE — Patient Instructions (Signed)
Calf / Gastoc: Runners' Stretch I    One leg back and straight, other forward and bent supporting weight, lean forward, gently stretching calf of back leg. Hold __45__ seconds. Repeat with other leg. Repeat __3__ times. Do __1__ sessions per day.  Copyright  VHI. All rights reserved.

## 2017-01-18 NOTE — Therapy (Signed)
[facility] [address] [place], [place], [address] Phone: [phone]   Fax:  [phone]  Physical Therapy Treatment  Patient Details  Name: [name] MRN: [id number] Date of Birth: December 14, 1940 Referring Provider: Dr. Vickey Hugerohmeier  Encounter Date: 01/18/2017      PT End of Session - 01/18/17 1558    Visit Number 6   Number of Visits 17   Date for PT Re-Evaluation 02/26/17   Authorization Type UHC Medicare: G-CODE AND PROGRESS NOTE EVERY 10 VISITS.    PT Start Time 1407  pt late   PT Stop Time 1446   PT Time Calculation (min) 39 min   Equipment Utilized During Treatment --  min guard to S prn   Activity Tolerance Patient tolerated treatment well   Behavior During Therapy WFL for tasks assessed/performed      Past Medical History:  Diagnosis Date  . CKD (chronic kidney disease), stage II   . CVA (cerebral infarction)    Left thalamic, right-sided weakness wih aphagia, right leg brace  . Dyslipidemia   . Elevated PSA    biopsy negative in 2008  . GERD (gastroesophageal reflux disease)   . Hypertension    without medication since 2006  . Major depression   . Seasonal rhinitis     Past Surgical History:  Procedure Laterality Date  . NASAL SEPTUM SURGERY    . VASECTOMY      There were no vitals filed for this visit.      Subjective Assessment - 01/18/17 1411    Subjective Pt denied falls or changes since last visit. Pt [facility] at [facility] on 01/[facility]/17.    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 No/denies         Therex: Standing gastroc stretch for RLE, see pt instructions for details. B UE support and S for safety. 3  reps with 45 second holds. Seated B scapular retraction(2x10reps)  to improve posture and strength, cues and demo for technique. Seated B shoulder rolls (x10 reps) in posterior directions with cues and demo for technique.                 [facility] Adult PT Treatment/Exercise - 01/18/17 1553      Ambulation/Gait   Ambulation/Gait Yes   Ambulation/Gait Assistance 4: Min guard;5: Supervision   Ambulation/Gait Assistance Details Pt amb. at counter with 1 UE support and [facility] with cues to improve R UE flexion/scapular winging, RLE IR during stance, and upright posture.  Mirror utilized for feedback and PT provided manual facilitation at shoulder during amb.    Ambulation Distance (Feet) 10 Feet  x10 at counter and 230', 100'x2 with SPC   Assistive device Straight cane  and UE support at counter   Gait Pattern Step-through pattern;Decreased stride length;Decreased arm swing - right;Decreased dorsiflexion - right;Decreased hip/knee flexion - right;Right hip hike;Decreased trunk rotation   Ambulation Surface Level;Indoor   Gait Comments Pt also amb. at counter in backwards direction with cues to improve L step length and R hip ext.                PT Education - 01/18/17 1557    Education provided Yes  Education Details PT provided pt with gastroc stretch, B scapular retraction (need to print this next visit), encouraged B shoulder rolls in posterior direction and discouraged any activities that encourage R shoulder IR. PT asked pt and his dtr again if they would like OT, and the agreed as pt having difficulty with fine motor skills (RUE) and R UE during gait.    Person(s) Educated Patient;Spouse   Methods Explanation;Demonstration;Tactile cues;Verbal cues;Handout   Comprehension Verbalized understanding;Returned demonstration;Need further instruction          PT Short Term Goals - 01/04/17 1539      PT SHORT TERM GOAL #1   Title Pt will be IND in HEP to  improve balance, gait, strength, and flexibility. TARGET DATE FOR ALL STGS: 01/25/17   Status New     PT SHORT TERM GOAL #2   Title Perform BERG and write STG and LTGs.   Status Achieved     PT SHORT TERM GOAL #3   Title Pt will improve gait speed to >/=2.49ft/sec. with LRAD to safely amb. in the community.    Status New     PT SHORT TERM GOAL #4   Title Pt will amb. 500' over even terrain at MOD I level with LRAD to improve functional mobility.    Status New     PT SHORT TERM GOAL #5   Title Pt will improve BERG score to >/=40/56 to decr. falls risk.    Status New           PT Long Term Goals - 01/05/17 1426      PT LONG TERM GOAL #1   Title Pt will verbalize understanding of risk factors/signs/symptoms of CVA to reduce risk of add'l CVA. TARGET DATE FOR ALL LTGS: 02/22/17   Status Achieved  01/05/17     PT LONG TERM GOAL #2   Title Pt will amb. 700' over even/uneven terrain with LRAD, at MOD I level, to perform yard work safely.    Status New     PT LONG TERM GOAL #3   Title Pt will report zero falls in last 4 weeks to improve safety.   Status New     PT LONG TERM GOAL #4   Title Pt will improve BERG score to >/=44/56 to decr. falls risk.    Status New               Plan - 01/18/17 1559    Clinical Impression Statement Pt able to perform RLE ER during gait with and without mirror for feedback. However, pt experiences RLE IR after amb. longer distances 2/2 fatigue. Pt was able to reduce RUE flexion during gait with frequent cues. Pt continues to be limited by incr. RLE extensor tone during amb. PT will ask for OT order from MD, as pt and dtr now agreeable to OT eval to improve IND in ADLs and RUE impairments. Continue with POC.    Rehab Potential Good   Clinical Impairments Affecting Rehab Potential see PMH, and CVA 13 years ago   PT Frequency 2x / week   PT Duration 8 weeks   PT Treatment/Interventions ADLs/Self Care Home Management;Biofeedback;Canalith  Repostioning;Electrical Stimulation;Neuromuscular re-education;Balance training;Therapeutic exercise;Therapeutic activities;Manual techniques;Functional mobility training;Stair training;Orthotic Fit/Training;Gait training;DME Instruction;Patient/family education;Vestibular   PT Next Visit Plan give pics of B scapular retraction in seated position-add to HEP: cont with gait training   Recommended Other Services OT eval   Consulted and Agree with Plan of Care Patient;Family member/caregiver   Family Member  Consulted [name]      Patient will benefit from skilled therapeutic intervention in order to improve the following deficits and impairments:  Decreased endurance, Abnormal gait, Decreased knowledge of use of DME, Decreased balance, Decreased mobility, Dizziness, Impaired flexibility, Postural dysfunction, Decreased coordination, Decreased strength, Impaired tone  Visit Diagnosis: Hemiplegia and hemiparesis following cerebral infarction affecting right non-dominant side (HCC)  Other abnormalities of gait and mobility     Problem List Patient Active Problem List   Diagnosis Date Noted  . 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 Right foot drop 12/02/2016  . 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 Depression due to dementia 01/28/2015  . 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 Hemiplegia following CVA (cerebrovascular accident) (HCC) 05/31/2014  . 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 Left pontine stroke (HCC) 05/31/2014  . MCI (mild cognitive impairment) with memory loss 05/31/2014    Aziza Stuckert L 01/18/2017, 4:03 PM  [facility] [address] [place], [place], [address] Phone: [phone]   Fax:  [phone]  Name: [name] MRN: [id number] Date of Birth: [birth year]  Zerita Boers, PT,DPT 01/18/17 4:04 PM Phone: [phone] Fax: [phone]

## 2017-01-19 ENCOUNTER — Telehealth: Payer: Self-pay | Admitting: Neurology

## 2017-01-19 DIAGNOSIS — I639 Cerebral infarction, unspecified: Secondary | ICD-10-CM

## 2017-01-19 DIAGNOSIS — I6350 Cerebral infarction due to unspecified occlusion or stenosis of unspecified cerebral artery: Secondary | ICD-10-CM

## 2017-01-19 NOTE — Progress Notes (Signed)
I agree with the assessment and plan as directed by PT .The patient is known to me . I am not sure how to treat a spasticity resulting form a 76 year old pontine stroke ? Will ask Dr. Ivory BroadZach Swartz , Rehab, for help. I will send him this note as well.  Thank  You.  Melvyn Novasarmen Feliciano Wynter, MD      Melvyn NovasHMEIER,Tayden Nichelson, MD

## 2017-01-19 NOTE — Telephone Encounter (Signed)
-----   Message from Sherren KernsJennifer L Miller, PT sent at 01/18/2017  4:11 PM EDT ----- Dr. Vickey Hugerohmeier~  [name] reports increased difficulty performing ADLs which require fine motor skills (cutting food) and has RUE weakness/spasticity which limits his walking. I feel he would benefit from an OT evaluation, if you agree please send us an OT order.  Also, [name]'s RLE extensor tone during ambulation is significant and greatly impacts his balance and safety during amb. Is there a way to reduce the R-sided tone, in order to help him improve gait deviations and safety during gait?  Thank you, Zerita BoersJennifer Miller, PT,DPT 01/18/17 4:11 PM Phone: [phone] Fax: [phone]

## 2017-01-20 NOTE — Telephone Encounter (Signed)
Jennifer Please see Dr. Oliva Bustardohmeier's message below .

## 2017-01-21 ENCOUNTER — Ambulatory Visit: Payer: 59

## 2017-01-21 ENCOUNTER — Encounter: Payer: Self-pay | Admitting: Physical Medicine & Rehabilitation

## 2017-01-21 DIAGNOSIS — I69353 Hemiplegia and hemiparesis following cerebral infarction affecting right non-dominant side: Secondary | ICD-10-CM

## 2017-01-21 DIAGNOSIS — R2689 Other abnormalities of gait and mobility: Secondary | ICD-10-CM

## 2017-01-21 NOTE — Therapy (Signed)
[facility] [address] [place], [place], [address] Phone: [phone]   Fax:  [phone]  Physical Therapy Treatment  Patient Details  Name: [name] MRN: [id number] Date of Birth: Sep 17, 1940 Referring Provider: Dr. Brett Fairy  Encounter Date: 01/21/2017      PT End of Session - 01/21/17 1635    Visit Number 7   Number of Visits 17   Date for PT Re-Evaluation 02/26/17   Authorization Type UHC Medicare: G-CODE AND PROGRESS NOTE EVERY 10 VISITS.    PT Start Time 1401   PT Stop Time 1444   PT Time Calculation (min) 43 min   Equipment Utilized During Treatment Gait belt   Activity Tolerance Patient tolerated treatment well   Behavior During Therapy WFL for tasks assessed/performed      Past Medical History:  Diagnosis Date  . CKD (chronic kidney disease), stage II   . CVA (cerebral infarction)    Left thalamic, right-sided weakness wih aphagia, right leg brace  . Dyslipidemia   . Elevated PSA    biopsy negative in 2008  . GERD (gastroesophageal reflux disease)   . Hypertension    without medication since 2006  . Major depression   . Seasonal rhinitis     Past Surgical History:  Procedure Laterality Date  . NASAL SEPTUM SURGERY    . VASECTOMY      There were no vitals filed for this visit.      Subjective Assessment - 01/21/17 1405    Subjective Pt denied falls or changes since last visit. Pt has appt. with Physical medicine and  rehab on 02/07/17 for botox eval.   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 No/denies                         [facility] Adult PT Treatment/Exercise - 01/21/17 1412      Ambulation/Gait   Ambulation/Gait Yes   Ambulation/Gait Assistance 5: Supervision;4: Min guard   Ambulation/Gait Assistance Details Pt amb. with SPC with min guard while traversing over carpet/tile transition. Cues to reduce RUE flexion, improve heel strike, and decr. RLE IR.   Ambulation Distance (Feet) 500 Feet  47' and 100'   Assistive device Straight cane   Gait Pattern Step-through pattern;Decreased stride length;Decreased arm swing - right;Decreased dorsiflexion - right;Decreased hip/knee flexion - right;Right hip hike;Decreased trunk rotation   Ambulation Surface Level;Indoor   Gait velocity 1.59f/sec [facility]    Standardized Balance Assessment   Standardized Balance Assessment Berg Balance Test     Berg Balance Test   Sit to Stand Able to stand without using hands and stabilize independently   Standing Unsupported Able to stand safely 2 minutes   Sitting with Back Unsupported but Feet Supported on Floor or Stool Able to sit safely and securely 2 minutes   Stand to Sit Sits safely with minimal use of hands   Transfers Able to transfer with verbal cueing and /or supervision   Standing Unsupported with Eyes Closed Able to stand 10 seconds safely   Standing Ubsupported with Feet Together Able to place feet together independently and stand 1 minute safely   From Standing, Reach Forward with Outstretched Arm Can reach  forward >12 cm safely (5")   From Standing Position, Pick up Object from [place] to pick up shoe safely and easily   From Standing Position, Turn to Look Behind Over each Shoulder Looks behind from both sides and weight shifts well   Turn 360 Degrees Able to turn 360 degrees safely but slowly   Standing Unsupported, Alternately Place Feet on Step/Stool Needs assistance to keep from falling or unable to try   Standing Unsupported, One Foot in [place] to plae foot ahead of the other independently and hold 30 seconds   Standing on One Leg Unable to try or needs assist to prevent fall    Total Score 42           Self Care:     PT Education - 01/21/17 1633    Education provided Yes   Education Details PT informed pt that MD placed OT order and pt can now schedule OT appt. Pt asked about appt. with Physical Medicine and Rehab and whether they would perform botox, PT educated pt that MD will make the best decision for pt regarding how to treat incr. spasticity during amb. PT educated pt that he can perform toe taps on stairs with B rails with dtr present to improve SLS. PT educated pt that if pt does receive botox it would be best to hold PT for 3 weeks after botox, and then resume.    Person(s) Educated Patient;Child(ren)   Methods Explanation;Verbal cues   Comprehension Verbalized understanding          PT Short Term Goals - 01/21/17 1639      PT SHORT TERM GOAL #1   Title Pt will be IND in HEP to improve balance, gait, strength, and flexibility. TARGET DATE FOR ALL STGS: 01/25/17   Status New     PT SHORT TERM GOAL #2   Title Perform BERG and write STG and LTGs.   Status Achieved     PT SHORT TERM GOAL #3   Title Pt will improve gait speed to >/=2.69f/sec. with LRAD to safely amb. in the community.    Status Partially Met     PT SHORT TERM GOAL #4   Title Pt will amb. 500' over even terrain at MOD I level with LRAD to improve functional mobility.    Status Partially Met     PT SHORT TERM GOAL #5   Title Pt will improve BERG score to >/=40/56 to decr. falls risk.    Status Achieved           PT Long Term Goals - 01/05/17 1426      PT LONG TERM GOAL #1   Title Pt will verbalize understanding of risk factors/signs/symptoms of CVA to reduce risk of add'l CVA. TARGET DATE FOR ALL LTGS: 02/22/17   Status Achieved  01/05/17     PT LONG TERM GOAL #2   Title Pt will amb. 700' over even/uneven terrain with LRAD, at MOD I level, to perform yard work safely.    Status New     PT LONG TERM GOAL #3   Title Pt will report zero falls in last 4 weeks to  improve safety.   Status New     PT LONG TERM GOAL #4   Title Pt will improve BERG score to >/=44/56 to decr. falls risk.    Status New               Plan - 01/21/17 1635  Clinical Impression Statement Pt met STG 5 and partially met STGs 3 and 4. PT will assess STG 1 next session. Pt's BERG score has improved but still indicates pt is at risk for falls. Pt would continue to benefit from skilled PT to improve safety during functional mobility.    Rehab Potential Good   Clinical Impairments Affecting Rehab Potential see PMH, and CVA 13 years ago   PT Frequency 2x / week   PT Duration 8 weeks   PT Treatment/Interventions ADLs/Self Care Home Management;Biofeedback;Canalith Repostioning;Electrical Stimulation;Neuromuscular re-education;Balance training;Therapeutic exercise;Therapeutic activities;Manual techniques;Functional mobility training;Stair training;Orthotic Fit/Training;Gait training;DME Instruction;Patient/family education;Vestibular   PT Next Visit Plan Assess STG 1, provide pics of B scapular retraction in seated position-add to HEP: cont with gait training   Consulted and Agree with Plan of Care Patient;Family member/caregiver   Family Member Consulted [name]      Patient will benefit from skilled therapeutic intervention in order to improve the following deficits and impairments:  Decreased endurance, Abnormal gait, Decreased knowledge of use of DME, Decreased balance, Decreased mobility, Dizziness, Impaired flexibility, Postural dysfunction, Decreased coordination, Decreased strength, Impaired tone  Visit Diagnosis: Other abnormalities of gait and mobility  Hemiplegia and hemiparesis following cerebral infarction affecting right non-dominant side [facility])     Problem List Patient Active Problem List   Diagnosis Date Noted  . Hemiparesis affecting right side as late effect of cerebrovascular accident ([place]) 12/02/2016  . Right foot drop 12/02/2016  . Cognitive  and neurobehavioral dysfunction 06/30/2015  . Depression due to dementia 01/28/2015  . Dissection of vertebral artery ([place]) 01/28/2015  . 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 Hemiplegia following CVA (cerebrovascular accident) ([place]) 05/31/2014  . 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 Left pontine stroke ([place]) 05/31/2014  . MCI (mild cognitive impairment) with memory loss 05/31/2014    Morrison Masser L 01/21/2017, 4:40 PM  [facility] [address] [place], [place], [address] Phone: [phone]   Fax:  [phone]  Name: Colt Martelle MRN: [id number] Date of Birth: 1940/11/16  Geoffry Paradise, PT,DPT 01/21/17 4:41 PM Phone: [phone] Fax: [phone]

## 2017-01-25 ENCOUNTER — Ambulatory Visit: Payer: 59 | Admitting: Physical Therapy

## 2017-01-25 DIAGNOSIS — I69353 Hemiplegia and hemiparesis following cerebral infarction affecting right non-dominant side: Secondary | ICD-10-CM | POA: Diagnosis not present

## 2017-01-25 DIAGNOSIS — R2689 Other abnormalities of gait and mobility: Secondary | ICD-10-CM

## 2017-01-25 NOTE — Patient Instructions (Addendum)
Flexibility: Corner Stretch    Standing in corner with hands just above shoulder level and feet _8-10___ inches from corner, lean forward until a comfortable stretch is felt across chest. Hold _30___ seconds. Repeat __1_ times per set. Do ___1_ sets per session. Do _2-3___ sessions per day.  http://orth.exer.us/342

## 2017-01-26 NOTE — Therapy (Signed)
[facility] [address] [place], [place], [address] Phone: [phone]   Fax:  [phone]  Physical Therapy Treatment  Patient Details  Name: [name] MRN: [id number] Date of Birth: 1941/05/23 Referring Provider: Dr. Brett Fairy  Encounter Date: 01/25/2017      PT End of Session - 01/26/17 2152    Visit Number 8   Number of Visits 17   Date for PT Re-Evaluation 02/26/17   Authorization Type UHC Medicare: G-CODE AND PROGRESS NOTE EVERY 10 VISITS.    PT Start Time 1403   PT Stop Time 1446   PT Time Calculation (min) 43 min      Past Medical History:  Diagnosis Date  . CKD (chronic kidney disease), stage II   . CVA (cerebral infarction)    Left thalamic, right-sided weakness wih aphagia, right leg brace  . Dyslipidemia   . Elevated PSA    biopsy negative in 2008  . GERD (gastroesophageal reflux disease)   . Hypertension    without medication since 2006  . Major depression   . Seasonal rhinitis     Past Surgical History:  Procedure Laterality Date  . NASAL SEPTUM SURGERY    . VASECTOMY      There were no vitals filed for this visit.      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 Other (Comment)  pt reporting discomfort in Rt foot now that AFO has been modified                         [facility] Adult PT Treatment/Exercise - 01/26/17 0001      Ambulation/Gait   Ambulation/Gait Yes   Ambulation/Gait Assistance 5: Supervision;4:  Min guard   Ambulation/Gait Assistance Details cues to externally rotate Rt foot as able    Ambulation Distance (Feet) 115 Feet   Assistive device Straight cane   Gait Pattern Step-through pattern;Decreased stride length;Decreased arm swing - right;Decreased dorsiflexion - right;Decreased hip/knee flexion - right;Right hip hike;Decreased trunk rotation   Ambulation Surface Level;Indoor     Knee/Hip Exercises: Stretches   Active Hamstring Stretch Right;1 rep;30 seconds  runner's stretch   Gastroc Stretch Right;1 rep;30 seconds     Knee/Hip Exercises: Seated   Hamstring Curl AROM;Right;1 set;10 reps  min assist to keep Rt foot in neutral position      Self Care- removed pt's AFO on RLE due to c/o discomfort after just having brace modified at [place] area noted on anterior foot due to pressure of strap with plastic insert having been removed;  Insert was Replaced to disperse pressure to prevent redness/skin breakdown          PT Education - 01/26/17 2152    Education provided Yes   Education Details pec stretch in corner   Person(s) Educated Patient   Methods Explanation;Demonstration;Handout   Comprehension Verbalized understanding;Returned demonstration          PT Short Term Goals - 01/26/17 2157      PT SHORT  TERM GOAL #1   Title Pt will be IND in HEP to improve balance, gait, strength, and flexibility. TARGET DATE FOR ALL STGS: 01/25/17   Status Partially Met           PT Long Term Goals - 01/05/17 1426      PT LONG TERM GOAL #1   Title Pt will verbalize understanding of risk factors/signs/symptoms of CVA to reduce risk of add'l CVA. TARGET DATE FOR ALL LTGS: 02/22/17   Status Achieved  01/05/17     PT LONG TERM GOAL #2   Title Pt will amb. 700' over even/uneven terrain with LRAD, at MOD I level, to perform yard work safely.    Status New     PT LONG TERM GOAL #3   Title Pt will report zero falls in last 4 weeks to improve safety.   Status New     PT  LONG TERM GOAL #4   Title Pt will improve BERG score to >/=44/56 to decr. falls risk.    Status New               Plan - 01/26/17 2153    Clinical Impression Statement Use of strap only on AFO resulted in significant redness on anterior foot and also red pressure area was noted on Rt lateral foot where strap was attached to inside of brace;  plastic insert was replaced so that pressure could be more evenly dispersed to pprevent skin breakdown    Rehab Potential Fair   Clinical Impairments Affecting Rehab Potential see PMH, and CVA 13 years ago; severity of tone   PT Frequency 2x / week   PT Duration 8 weeks   PT Treatment/Interventions ADLs/Self Care Home Management;Biofeedback;Canalith Repostioning;Electrical Stimulation;Neuromuscular re-education;Balance training;Therapeutic exercise;Therapeutic activities;Manual techniques;Functional mobility training;Stair training;Orthotic Fit/Training;Gait training;DME Instruction;Patient/family education;Vestibular   PT Next Visit Plan STG 1 assessed - pt and daughter report that they do not have balance exercises;provide pics of B scapular retraction in seated position-add to HEP: cont with gait training   Consulted and Agree with Plan of Care Patient;Family member/caregiver   Family Member Consulted [name]      Patient will benefit from skilled therapeutic intervention in order to improve the following deficits and impairments:  Decreased endurance, Abnormal gait, Decreased knowledge of use of DME, Decreased balance, Decreased mobility, Dizziness, Impaired flexibility, Postural dysfunction, Decreased coordination, Decreased strength, Impaired tone  Visit Diagnosis: Other abnormalities of gait and mobility     Problem List Patient Active Problem List   Diagnosis Date Noted  . Hemiparesis affecting right side as late effect of cerebrovascular accident ([place]) 12/02/2016  . Right foot drop 12/02/2016  . Cognitive and neurobehavioral  dysfunction 06/30/2015  . Depression due to dementia 01/28/2015  . Dissection of vertebral artery ([place]) 01/28/2015  . 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 Hemiplegia following CVA (cerebrovascular accident) ([place]) 05/31/2014  . 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 Left pontine stroke ([place]) 05/31/2014  . MCI (mild cognitive impairment) with memory loss 05/31/2014    Eliyanah Elgersma, [place], PT 01/26/2017, 9:59 PM  [facility] [address] [place], [place], [address] Phone: [phone]   Fax:  [phone]  Name: Guthrie Lemme MRN: [id number] Date of Birth: 04-07-1941

## 2017-01-27 ENCOUNTER — Ambulatory Visit: Payer: 59 | Admitting: Occupational Therapy

## 2017-01-27 ENCOUNTER — Encounter: Payer: Self-pay | Admitting: Occupational Therapy

## 2017-01-27 ENCOUNTER — Ambulatory Visit: Payer: 59 | Admitting: Physical Therapy

## 2017-01-27 DIAGNOSIS — I69353 Hemiplegia and hemiparesis following cerebral infarction affecting right non-dominant side: Secondary | ICD-10-CM

## 2017-01-27 DIAGNOSIS — R2689 Other abnormalities of gait and mobility: Secondary | ICD-10-CM

## 2017-01-27 DIAGNOSIS — R293 Abnormal posture: Secondary | ICD-10-CM

## 2017-01-27 DIAGNOSIS — R2681 Unsteadiness on feet: Secondary | ICD-10-CM

## 2017-01-27 NOTE — Telephone Encounter (Signed)
I called pt's daughter, Westley ChandlerDeana Byrd, per DPR. I advised her that Dr. Vickey Hugerohmeier advises that if pt has a b12 deficiency, he could display fatigue and memory problems, but that vitamin b12 injections are usually given by PCP. Deana reports that she plans to call Dr. Venita SheffieldHusain's office.

## 2017-01-27 NOTE — Therapy (Signed)
[facility] [address] [place], [place], [address] Phone: [phone]   Fax:  [phone]  Occupational Therapy Evaluation  Patient Details  Name: [name] MRN: [id number] Date of Birth: 1940/09/13 Referring Provider: Dr. Porfirio Mylararmen Dohmeier  Encounter Date: 01/27/2017      OT End of Session - 01/27/17 1723    Visit Number 1   Number of Visits 8   Date for OT Re-Evaluation 02/24/17   Authorization Type medicare will need G code and PN every 10th visit   Authorization Time Period 60 days   OT Start Time 1447   OT Stop Time 1528   OT Time Calculation (min) 41 min   Activity Tolerance Patient tolerated treatment well      Past Medical History:  Diagnosis Date  . CKD (chronic kidney disease), stage II   . CVA (cerebral infarction)    Left thalamic, right-sided weakness wih aphagia, right leg brace  . Dyslipidemia   . Elevated PSA    biopsy negative in 2008  . GERD (gastroesophageal reflux disease)   . Hypertension    without medication since 2006  . Major depression   . Seasonal rhinitis     Past Surgical History:  Procedure Laterality Date  . NASAL SEPTUM SURGERY    . VASECTOMY      There were no vitals filed for this visit.      Subjective Assessment - 01/27/17 1454    Subjective  we love this therapy place   Patient is accompained by: Family member  daughter   Pertinent History Pt with CVA 13 years ago; pt's wife passed away earlier this year and pt underwent decline with increase in falls. PT recommeneded OT eval.    Patient Stated Goals I don't know- I am not sure what OT can do for me   Currently in Pain? No/denies           [facility] OT Assessment - 01/27/17 0001      Assessment   Diagnosis R CVA   Referring Provider Dr. Porfirio Mylararmen Dohmeier   Onset Date --  July 2005   Prior Therapy PT, OT, ST[facility]       Precautions   Precautions Fall     Restrictions   Weight  Bearing Restrictions No     Balance Screen   Has the patient fallen in the past 6 months Yes   How many times? 3  Pt currently seeing PT     Home  Environment   Family/patient expects to be discharged to: Private residence   Living Arrangements Children  son in law   Available Help at Discharge Available PRN/intermittently  dtr runs errands early and when pt naps   Type of Home House   Home Layout Two level  pt lives on first level   Bathroom Shower/Tub Walk-in Shower   [employer]ied [employer]Bathroom Toilet Standard   Additional Comments seat in shower, 4 grab bars with hand held shower.  2 grab bars near toilet - one on side and one in front.      Prior Function   Level of Independence Independent   Vocation Retired   Leisure walk with dr, watch tv and movies,play board games     ADL   Eating/Feeding Modified independent  dtr has to cue for pt to use R hand   Grooming Independent   Upper Body Bathing Modified independent   Lower Body Bathing Modified independent   Upper  Body Dressing Increased time   Lower Body Dressing Minimal assistance  occassional assist to button pants depending on the pants   Toilet Tranfer Modified independent   Toileting - Clothing Manipulation Modified independent   Toileting -  Hygiene Modified Independent   Tub/Shower Transfer Modified independent   ADL comments Pt has fallen but never in bathroom     IADL   Shopping Needs to be accompanied on any shopping trip   Light Housekeeping --  assists with small jobs in the house   Meal Prep Needs to have meals prepared and served   [employer] on family or friends for transportation   Medication Management Has difficulty remembering to take medication  dtr assists with setting up meds   Financial Management Requires assistance     Mobility   Mobility Status History of falls     Written Expression   Dominant Hand Left  pt is left handed from birth     Vision - History   Baseline Vision No visual  deficits   Additional Comments Pt has no vertical movement in eyes otherwise no other visual problems     Activity Tolerance   Activity Tolerance Tolerates 30 min activity with muliple rests     Cognition   Overall Cognitive Status History of cognitive impairments - at baseline  impaired from stroke 13 years ago but at that baseline now     Sensation   Light Touch Appears Intact   Hot/Cold Appears Intact   Proprioception Appears Intact     Coordination   Gross Motor Movements are Fluid and Coordinated No   Fine Motor Movements are Fluid and Coordinated No     Tone   Assessment Location Right Upper Extremity     ROM / Strength   AROM / PROM / Strength AROM;Strength     AROM   Overall AROM  Deficits   Overall AROM Comments R shoulder flexion 110*, abduction to 120*, supination half range, pt has dystonic posturing in arm and hand when standing and ambulating however has isolated movement throughout the arm and hand in sitting.      Strength   Overall Strength Deficits     Hand Function   Right Hand Gross Grasp Impaired   Right Hand Grip (lbs) 45   Left Hand Gross Grasp Functional   Left Hand Grip (lbs) 105     RUE Tone   RUE Tone Mild     RLE Tone   RLE Tone Modified Ashworth     RLE Tone   Modified Ashworth Scale for Grading Hypertonia RLE Slight increase in muscle tone, manifested by a catch and release or by minimal resistance at the end of the range of motion when the affected part(s) is moved in flexion or extension                              OT Long Term Goals - 01/27/17 1708      OT LONG TERM GOAL #1   Title Pt and family will be  mod I with home activities program - 02/24/2017   Status New     OT LONG TERM GOAL #2   Title Pt will demonstrate ability to use RUE as non dominant in simple ADL tasks   Status New     OT LONG TERM GOAL #3   Title Pt will demonstrate ability to carry light weight object in R hand  while ambulating    Status New     OT LONG TERM GOAL #4   Title Pt will be mod I with simple snack/cold meal prep   Status New               Plan - 02-15-2017 1710    Clinical Impression Statement Pt is a 76 year Graham male s/p L CVA 13 years ago;  pt's wife recently passed away and pt suffered decline marked by decreased activitty tolerance, falls and overall increased generalized weakness.  Pt's daughter also reports that pt had limited therapy after his stroke and it appears that pt has the potential for improved functional use of RUE.  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  Pt presents today with the following impairments that impact his ability to complete ADL's, ADL's, and leisure activities:  decreased trunk control, decreased balance, dystonic posturing of RUE, increased tone RUE, decreased activity tolreance, decreased functional use of RUE.  Pt will benefit from skilled OT to address these deficits and maximize independence.    Rehab Potential Fair   Clinical Impairments Affecting Rehab Potential length of time since CVA,    OT Frequency 2x / week   OT Duration 4 weeks   OT Treatment/Interventions Self-care/ADL training;Neuromuscular education;Therapeutic exercise;Passive range of motion;Manual Therapy;Functional Mobility Training;Splinting;Therapeutic exercises;Therapeutic activities;Balance training;Patient/family education   Plan manual therapy for improved range/tightness of R shoulder gridle, NMR for RUE.trunk, balance, functional mobility   Consulted and Agree with Plan of Care Patient;Family member/caregiver   Family Member Consulted dtr      Patient will benefit from skilled therapeutic intervention in order to improve the following deficits and impairments:  Abnormal gait, Decreased activity tolerance, Decreased balance, Decreased cognition, Decreased range of motion, Decreased mobility, Decreased strength, Difficulty  walking, Impaired UE functional use, Impaired tone, Impaired perceived functional ability  Visit Diagnosis: 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 (HCC) - Plan: Ot plan of care cert/re-cert  Abnormal posture - Plan: Ot plan of care cert/re-cert  Unsteadiness on feet - Plan: Ot plan of care cert/re-cert      G-Codes - 02-15-2017 1725    Functional Assessment Tool Used (Outpatient only) skilled clinical observation   Functional Limitation Carrying, moving and handling objects   Carrying, Moving and Handling Objects Current Status [phone]) At least 80 percent but less than 100 percent impaired, limited or restricted   Carrying, Moving and Handling Objects Goal Status (U0454) At least 40 percent but less than 60 percent impaired, limited or restricted      Problem List Patient Active Problem List   Diagnosis Date Noted  . 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 Right foot drop 12/02/2016  . 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 Depression due to dementia 02-16-2015  . Dissection of vertebral artery (HCC) 02-16-15  . Amnestic MCI (mild cognitive impairment with memory loss) 02/16/2015  . Hemiplegia following CVA (cerebrovascular accident) (HCC) 05/31/2014  . 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 Left pontine stroke (HCC) 05/31/2014  . MCI (mild cognitive impairment) with memory loss 05/31/2014    Norton Pastel, OTR/L 02/15/17, 5:27 PM  [facility] [address] [place], [place], [address] Phone: [phone]   Fax:  [phone]  Name: [name] MRN: [id number] Date of Birth: Nov 24, 1940

## 2017-01-27 NOTE — Patient Instructions (Signed)
Knee Flexion: Sitting (Single Leg)    Sit facing anchor, leg extended. Tubing looped around ankle, flex knee, pulling back. Repeat _10_ times per set.  Do _2_ sets per session. USE GREEN BAND Anchor Height: Knee  http://tub.exer.us/53   Copyright  VHI. All rights reserved.

## 2017-01-27 NOTE — Telephone Encounter (Signed)
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  Usually B 12 injections are given by PCP, not neurology.

## 2017-01-28 NOTE — Therapy (Signed)
[name] [address] [place], [place], [address] Phone: [phone]   Fax:  [phone]  Physical Therapy Treatment  Patient Details  Name: [name] MRN: [id number] Date of Birth: 28-Mar-1941 Referring Provider: Dr. Brett Graham  Encounter Date: 01/27/2017      PT End of Session - 01/28/17 1126    Visit Number 9   Number of Visits 17   Date for PT Re-Evaluation 02/26/17   Authorization Type [employer] Medicare: G-CODE AND PROGRESS NOTE EVERY 10 VISITS.    PT Start Time 1401   PT Stop Time 1445   PT Time Calculation (min) 44 min      Past Medical History:  Diagnosis Date  . CKD (chronic kidney disease), stage II   . CVA (cerebral infarction)    Left thalamic, right-sided weakness wih aphagia, right leg brace  . Dyslipidemia   . Elevated PSA    biopsy negative in 2008  . GERD (gastroesophageal reflux disease)   . Hypertension    without medication since 2006  . Major depression   . Seasonal rhinitis     Past Surgical History:  Procedure Laterality Date  . NASAL SEPTUM SURGERY    . VASECTOMY      There were no vitals filed for this visit.      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 No/denies            Reviewed all exercises given to date for HEP - prioritized and streamlined these exercises so that HEP would not be  Overwhelming for pt/daughter to perform regularly             [facility] Adult PT Treatment/Exercise - 01/28/17 0001      Ambulation/Gait   Ambulation/Gait Yes    Ambulation/Gait Assistance 5: Supervision;4: Min guard   Ambulation/Gait Assistance Details cues to externally rotate RLE in swing phase of gait   Ambulation Distance (Feet) 230 Feet   Assistive device Straight cane   Gait Pattern Step-through pattern;Decreased stride length;Decreased arm swing - right;Decreased dorsiflexion - right;Decreased hip/knee flexion - right;Right hip hike;Decreased trunk rotation   Ambulation Surface Level;Indoor     Knee/Hip Exercises: Seated   Hamstring Curl Strengthening;Left;1 set;10 reps  with green theraband with cues to keep Lt foot in neutral             Balance Exercises - 01/28/17 1120      Balance Exercises: Standing   SLS Eyes open  touching balance bubble with LLE for improved RLE SLS   Stepping Strategy Anterior;Posterior  stepping from carpet to tile with LLE for RLE SLS   Retro Gait 3 reps  along counter with cues to flex Rt knee    Sidestepping 2 reps  along counter           PT Education - 01/28/17 1125    Education provided Yes   Education Details added seated hamstring curl with Rt foot in neutral to HEP; daughter stated they were doing scapular retraction so declined pic of this exercise; reviewed all previous exercises given to date for  HEP   Person(s) Educated Patient   Methods Explanation;Demonstration;Handout   Comprehension Verbalized understanding;Returned demonstration          PT Short Term Goals - 01/26/17 2157      PT SHORT TERM GOAL #1   Title Pt will be IND in HEP to improve balance, gait, strength, and flexibility. TARGET DATE FOR ALL STGS: 01/25/17   Status Partially Met           PT Long Term Goals - 01/05/17 1426      PT LONG TERM GOAL #1   Title Pt will verbalize understanding of risk factors/signs/symptoms of CVA to reduce risk of add'l CVA. TARGET DATE FOR ALL LTGS: 02/22/17   Status Achieved  01/05/17     PT LONG TERM GOAL #2   Title Pt will amb. 700' over even/uneven terrain with LRAD, at  MOD I level, to perform yard work safely.    Status New     PT LONG TERM GOAL #3   Title Pt will report zero falls in last 4 weeks to improve safety.   Status New     PT LONG TERM GOAL #4   Title Pt will improve BERG score to >/=44/56 to decr. falls risk.    Status New               Plan - 01/28/17 1127    Clinical Impression Statement Pt able to achieve active Rt knee flexion in swing during backwards ambulation along countertop; able to isolate Rt knee flexion in seated but minimal Rt knee flexion achieved during forward ambulation with continued interanl rotation and inversion of RLE due to incr. tone in standing/weight bearing position                                                       Rehab Potential Fair   Clinical Impairments Affecting Rehab Potential see PMH, and CVA 13 years ago; severity of tone   PT Frequency 2x / week   PT Duration 8 weeks   PT Treatment/Interventions ADLs/Self Care Home Management;Biofeedback;Canalith Repostioning;Electrical Stimulation;Neuromuscular re-education;Balance training;Therapeutic exercise;Therapeutic activities;Manual techniques;Functional mobility training;Stair training;Orthotic Fit/Training;Gait training;DME Instruction;Patient/family education;Vestibular   PT Next Visit Plan Pt scheduled for Botox consult on 02-07-17; held slot on 02-22-17 for cont. PT - reassess, renew and complete G code at next visit   Consulted and Agree with Plan of Care Patient;Family member/caregiver   Family Member Consulted dtr: [name]      Patient will benefit from skilled therapeutic intervention in order to improve the following deficits and impairments:  Decreased endurance, Abnormal gait, Decreased knowledge of use of DME, Decreased balance, Decreased mobility, Dizziness, Impaired flexibility, Postural dysfunction, Decreased coordination, Decreased strength, Impaired tone  Visit Diagnosis: Other abnormalities of gait and mobility     Problem  List Patient Active Problem List   Diagnosis Date Noted  . Hemiparesis affecting right side as late effect of cerebrovascular accident ([place]) 12/02/2016  . Right foot drop 12/02/2016  . 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 Depression due to dementia 01/28/2015  . Dissection of vertebral artery ([place]) 01/28/2015  . 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 Hemiplegia following CVA (cerebrovascular accident) ([place]) 05/31/2014  . 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 Left pontine stroke ([place]) 05/31/2014  .  MCI (mild cognitive impairment) with memory loss 05/31/2014    Kristof Nadeem, [place], PT 01/28/2017, 11:32 AM  [place] [address] [place], [place], [address] Phone: [phone]   Fax:  [phone]  Name: [name] MRN: [id number] Date of Birth: 1940/12/25

## 2017-02-01 ENCOUNTER — Encounter: Payer: Self-pay | Admitting: Occupational Therapy

## 2017-02-01 ENCOUNTER — Ambulatory Visit: Payer: 59 | Admitting: Occupational Therapy

## 2017-02-01 DIAGNOSIS — R293 Abnormal posture: Secondary | ICD-10-CM

## 2017-02-01 DIAGNOSIS — I69353 Hemiplegia and hemiparesis following cerebral infarction affecting right non-dominant side: Secondary | ICD-10-CM

## 2017-02-01 DIAGNOSIS — R2681 Unsteadiness on feet: Secondary | ICD-10-CM

## 2017-02-01 NOTE — Therapy (Signed)
[facility] [address] [place], [place], [address] Phone: [phone]   Fax:  [phone]  Occupational Therapy Treatment  Patient Details  Name: [name] MRN: [id number] Date of Birth: 1941-07-25 Referring Provider: Dr. Porfirio Mylar Dohmeier  Encounter Date: 02/01/2017      OT End of Session - 02/01/17 1258    Visit Number 2   Number of Visits 8   Date for OT Re-Evaluation 02/24/17   Authorization Type medicare will need G code and PN every 10th visit   Authorization Time Period 60 days   OT Start Time 0932   OT Stop Time 1020   OT Time Calculation (min) 48 min   Activity Tolerance Patient tolerated treatment well      Past Medical History:  Diagnosis Date  . CKD (chronic kidney disease), stage II   . CVA (cerebral infarction)    Left thalamic, right-sided weakness wih aphagia, right leg brace  . Dyslipidemia   . Elevated PSA    biopsy negative in 2008  . GERD (gastroesophageal reflux disease)   . Hypertension    without medication since 2006  . Major depression   . Seasonal rhinitis     Past Surgical History:  Procedure Laterality Date  . NASAL SEPTUM SURGERY    . VASECTOMY      There were no vitals filed for this visit.      Subjective Assessment - 02/01/17 0938    Subjective  I have a blister on my ankle from that new strap on my brace.    Patient is accompained by: Family member  dtr   Pertinent History Pt with CVA 13 years ago; pt's wife passed away earlier this year and pt underwent decline with increase in falls. PT recommeneded OT eval.    Patient Stated Goals I don't know- I am not sure what OT can do for me   Currently in Pain? Yes   Pain Score 3    Pain Location Foot   Pain Orientation Right   Pain Descriptors / Indicators Sore   Pain Type Acute pain   Pain Onset Today   Aggravating Factors  this came from the new strap on my AFO   Pain Relieving Factors dtr put bandaids on  blister and pt currently wearing AFO - will advise PT.                       OT Treatments/Exercises (OP) - 02/01/17 0001      ADLs   Overall ADLs Reviewed goals and POC - pt and dtr in agreement and provided with written copy   ADL Comments Pt and dtr report development of blister on outside of R ankle. Dtr reports that pt had new strap put on AFO and she feels this is causing this.  PT asked to look and recommended that pt and dtr return today to [employer] to have brace addressed and to not wear brace until this is fixed. Dtr and pt verbalized understanding.      Neurological Re-education Exercises   Other Exercises 1 Neuro re ed to address development of HEP for RUE.  Pt given actvities in supine in closed chain with emphasis on slow movement, full extension of arm, relaxing shoulder and grading of effort as well as keeping hand open and flat on ball.  See pt instructions for details. Pt needs mod cues to slow down, and focus on control of the RUE. Dtr able to  assist pt at home.                 OT Education - 02/01/17 1254    Education provided Yes   Education Details HEP for [employer]   Person(s) Educated Patient;Child(ren)   Methods Explanation;Demonstration;Verbal cues;Tactile cues;Handout  dtr videotaped   Comprehension Verbalized understanding;Returned demonstration  dtr to assist pt at home             OT Long Term Goals - 02/01/17 1255      OT LONG TERM GOAL #1   Title Pt and family will be  mod I with home activities program - 02/24/2017   Status On-going     OT LONG TERM GOAL #2   Title Pt will demonstrate ability to use RUE as non dominant in simple ADL tasks   Status On-going     OT LONG TERM GOAL #3   Title Pt will demonstrate ability to carry light weight object in R hand while ambulating   Status On-going     OT LONG TERM GOAL #4   Title Pt will be mod I with simple snack/cold meal prep   Status On-going               Plan -  02/01/17 1256    Clinical Impression Statement Pt and dtr in agreement with goals and POC.  Pt progressing toward goals.    Occupational performance deficits (Please refer to evaluation for details): ADL's;IADL's;Leisure   Rehab Potential Fair   Current Impairments/barriers affecting progress: length of time since CVA,    OT Frequency 2x / week   OT Duration 4 weeks   OT Treatment/Interventions Self-care/ADL training;Neuromuscular education;Therapeutic exercise;Passive range of motion;Manual Therapy;Functional Mobility Training;Splinting;Therapeutic exercises;Therapeutic activities;Balance training;Patient/family education   Consulted and Agree with Plan of Care Patient;Family member/caregiver   Family Member Consulted dtr   Plan manual therapy for improved range/tigtness of R shoulder girdle, NMR for RUE, trunk, balance, functional mobility      Patient will benefit from skilled therapeutic intervention in order to improve the following deficits and impairments:  Abnormal gait, Decreased activity tolerance, Decreased balance, Decreased cognition, Decreased range of motion, Decreased mobility, Decreased strength, Difficulty walking, Impaired UE functional use, Impaired tone, Impaired perceived functional ability  Visit Diagnosis: 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 Right foot drop 12/02/2016  . 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 Depression due to dementia 01/28/2015  . 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 Hemiplegia following CVA (cerebrovascular accident) (HCC) 05/31/2014  . Cerebral infarction due to basilar artery  occlusion (HCC) 05/31/2014  . Left pontine stroke (HCC) 05/31/2014  . MCI (mild cognitive impairment) with memory loss 05/31/2014    Mackie Paiulaski, [place],OTR/L 02/01/2017, 12:59 PM  [facility] [address] [place], [place], [address] Phone: [phone]   Fax:  [phone]  Name: [name] MRN: [id number] Date of Birth: 04-25-1941

## 2017-02-01 NOTE — Patient Instructions (Signed)
Home Exercise Program for RUE:  Do these lying down. MOVE SLOWLY and focus on controlling the ball.  1. Chest presses- 15 reps x2  2. Overhead reach - 12 reps x2 with your daughter providing a target.   Your daughter has videotaped these exercises for future reference.

## 2017-02-02 ENCOUNTER — Ambulatory Visit: Payer: 59 | Admitting: Occupational Therapy

## 2017-02-02 ENCOUNTER — Encounter: Payer: Self-pay | Admitting: Occupational Therapy

## 2017-02-02 DIAGNOSIS — R2681 Unsteadiness on feet: Secondary | ICD-10-CM

## 2017-02-02 DIAGNOSIS — I69353 Hemiplegia and hemiparesis following cerebral infarction affecting right non-dominant side: Secondary | ICD-10-CM

## 2017-02-02 DIAGNOSIS — R293 Abnormal posture: Secondary | ICD-10-CM

## 2017-02-02 NOTE — Therapy (Signed)
[facility] [address] [place], [place], [address] Phone: [phone]   Fax:  [phone]  Occupational Therapy Treatment  Patient Details  Name: [name] MRN: [id number] Date of Birth: Mar 04, 1941 Referring Provider: Dr. Porfirio Mylar Dohmeier  Encounter Date: 02/02/2017      OT End of Session - 02/02/17 1545    Visit Number 3   Date for OT Re-Evaluation 02/24/17   Authorization Type medicare will need G code and PN every 10th visit   Authorization Time Period 60 days   OT Start Time 1446   OT Stop Time 1532   OT Time Calculation (min) 46 min      Past Medical History:  Diagnosis Date  . CKD (chronic kidney disease), stage II   . CVA (cerebral infarction)    Left thalamic, right-sided weakness wih aphagia, right leg brace  . Dyslipidemia   . Elevated PSA    biopsy negative in 2008  . GERD (gastroesophageal reflux disease)   . Hypertension    without medication since 2006  . Major depression   . Seasonal rhinitis     Past Surgical History:  Procedure Laterality Date  . NASAL SEPTUM SURGERY    . VASECTOMY      There were no vitals filed for this visit.      Subjective Assessment - 02/02/17 1453    Subjective  They fixed my brace and it doesn't hurt any more.    Patient is accompained by: Family member  dtr   Pertinent History Pt with CVA 13 years ago; pt's wife passed away earlier this year and pt underwent decline with increase in falls. PT recommeneded OT eval.    Patient Stated Goals I don't know- I am not sure what OT can do for me   Currently in Pain? No/denies                      OT Treatments/Exercises (OP) - 02/02/17 0001      Neurological Re-education Exercises   Other Exercises 1 Neuro re ed after manual therapy with emphasis on active A/P tilts and lateral weight shifting in sitting to encourage active trunk elongation and shortening as well as active rotation,  active  elevation/depression/rotation/add and abduction of both scapulas.  Pt able to activate and maintain midline position in sitting with good alignment following activity. Progressed to addressing sit to stand after prep of RLE in midline without use of arms emphasizing forward weight shift prior to lift off into standing.  WIth practice and repetition pt able to complete with only close supervision and vc's.  Progressed to weight shifting to R in standing with good alignment -pt fearful of this weight shift due to long term postural malalignment and poor postural orientation however quickly improved with practice and facilitation.  Then transitioned into weight shifting to right and stepping with LLE to increase activiation of RLE , RUE and trunk in standing.  Pt initially needed max facilitation but with practice able to complete with vc's and min facilitation. Dtr and pt to practive sit to stand, stand to sit in midline engaging RLE as well as simple weight shifting in standing to decrease fear and increase confidence for pt.  Dtr and pt able to return demonstrate this activity  safely.       Manual Therapy   Manual Therapy Joint mobilization;Soft tissue mobilization;Scapular mobilization   Manual therapy comments Joint, soft tissue and scapular mob to address shoulder  girdle alignment as well as decrease tightness.  Also addressed trunk rotation, elongation on right side. Pt given stretch to do at home with dtr as well.                  OT Education - 02/01/17 1254    Education provided Yes   Education Details HEP for [employer]   Person(s) Educated Patient;Child(ren)   Methods Explanation;Demonstration;Verbal cues;Tactile cues;Handout  dtr videotaped   Comprehension Verbalized understanding;Returned demonstration  dtr to assist pt at home             OT Long Term Goals - 02/02/17 1543      OT LONG TERM GOAL #1   Title Pt and family will be  mod I with home activities program - 02/24/2017    Status On-going     OT LONG TERM GOAL #2   Title Pt will demonstrate ability to use RUE as non dominant in simple ADL tasks   Status On-going     OT LONG TERM GOAL #3   Title Pt will demonstrate ability to carry light weight object in R hand while ambulating   Status On-going     OT LONG TERM GOAL #4   Title Pt will be mod I with simple snack/cold meal prep   Status On-going               Plan - 02/02/17 1543    Clinical Impression Statement Pt progressing toward goals. Pt with good potential for re education for more normal movement patterns.    Rehab Potential Fair   Current Impairments/barriers affecting progress: length of time since CVA,    OT Frequency 2x / week   OT Duration 4 weeks   OT Treatment/Interventions Self-care/ADL training;Neuromuscular education;Therapeutic exercise;Passive range of motion;Manual Therapy;Functional Mobility Training;Splinting;Therapeutic exercises;Therapeutic activities;Balance training;Patient/family education   Plan manual therapy for improved range/tightness of R shoulder girdle and trunk,NMR for RUE, trunk, balance, functional mobility   Consulted and Agree with Plan of Care Patient;Family member/caregiver   Family Member Consulted dtr      Patient will benefit from skilled therapeutic intervention in order to improve the following deficits and impairments:  Abnormal gait, Decreased activity tolerance, Decreased balance, Decreased cognition, Decreased range of motion, Decreased mobility, Decreased strength, Difficulty walking, Impaired UE functional use, Impaired tone, Impaired perceived functional ability  Visit Diagnosis: 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 Right foot  drop 12/02/2016  . 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 Depression due to dementia 01/28/2015  . 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 Hemiplegia following CVA (cerebrovascular accident) (HCC) 05/31/2014  . 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 Left pontine stroke (HCC) 05/31/2014  . MCI (mild cognitive impairment) with memory loss 05/31/2014    Norton PastelPulaski, Karen Halliday, OTR/L 02/02/2017, 3:50 PM  [facility] [address] [place], [place], [address] Phone: [phone]   Fax:  [phone]  Name: [name] MRN: [id number] Date of Birth: 03-May-1941

## 2017-02-07 ENCOUNTER — Encounter: Payer: Medicare Other | Attending: Physical Medicine & Rehabilitation

## 2017-02-07 ENCOUNTER — Encounter: Payer: Self-pay | Admitting: Physical Medicine & Rehabilitation

## 2017-02-07 ENCOUNTER — Ambulatory Visit (HOSPITAL_BASED_OUTPATIENT_CLINIC_OR_DEPARTMENT_OTHER): Payer: Medicare Other | Admitting: Physical Medicine & Rehabilitation

## 2017-02-07 DIAGNOSIS — G8110 Spastic hemiplegia affecting unspecified side: Secondary | ICD-10-CM | POA: Diagnosis not present

## 2017-02-07 NOTE — Progress Notes (Signed)
Subjective:    Patient ID: Angie FavaDonnie Nieto, male    DOB: Nov 01, 1940, 76 y.o.   MRN: [id number]  HPI  Left pontine infarct 2005.had vertebral art dissection after neck trauma.  Post infarct HHPT, OT, recently started in Outpt PT, OT.  .  Mod I dressing and bathing. Feels off balance does not drive due to "inability to move eye up and down". Also has neck tightness  Pain Inventory Average Pain 0  Pain Right Now 0 My pain is no pain  In the last 24 hours, has pain interfered with the following? General activity 0 Relation with others 0 Enjoyment of life 0 What TIME of day is your pain at its worst? no pain Sleep (in general) Fair  Pain is worse with: no pain Pain improves with: no pain Relief from Meds: 9  Mobility walk with assistance use a cane how many minutes can you walk? 10 ability to climb steps?  yes do you drive?  no transfers alone Do you have any goals in this area?  yes  Function disabled: date disabled n/a retired  Neuro/Psych trouble walking  Prior Studies Any changes since last visit?  no  Physicians involved in your care Any changes since last visit?  no   Family History  Problem Relation Age of Onset  . Colon cancer Brother    Social History   Social History  . Marital status: Widowed    Spouse name: [name]  . Number of children: 2  . Years of education: 2612   Social History Main Topics  . Smoking status: Never Smoker  . Smokeless tobacco: Never Used  . Alcohol use No  . Drug use: No  . Sexual activity: Not Asked   Other Topics Concern  . None   Social History Narrative   Patient is married [name]([name]) and lives at home with his wife.   Patient is disabled.    Patient has two adult children and two grandchildren.   Patient has a high school education.   Patient is left-handed.   Patient drinks one cup of coffee daily.            Past Surgical History:  Procedure Laterality Date  . NASAL SEPTUM SURGERY    . VASECTOMY     Past  Medical History:  Diagnosis Date  . CKD (chronic kidney disease), stage II   . CVA (cerebral infarction)    Left thalamic, right-sided weakness wih aphagia, right leg brace  . Dyslipidemia   . Elevated PSA    biopsy negative in 2008  . GERD (gastroesophageal reflux disease)   . Hypertension    without medication since 2006  . Major depression   . Seasonal rhinitis    BP (!) 142/88   Pulse 87   Temp 98.6 F (37 C) (Oral)   Resp 14   SpO2 98%   Opioid Risk Score:   Fall Risk Score:  `1  Depression screen PHQ 2/9  Depression screen [facility] 02/07/2017 06/30/2015 05/31/2014  Decreased Interest 0 0 0  Down, Depressed, Hopeless 0 0 2  PHQ - 2 Score 0 0 2  Altered sleeping 3 - 1  Tired, decreased energy 0 - 0  Change in appetite 0 - 0  Feeling bad or failure about yourself  0 - 0  Trouble concentrating 0 - 2  Moving slowly or fidgety/restless 0 - 1  Suicidal thoughts 0 - 1  PHQ-9 Score 3 - 7  Review of Systems  Constitutional: Negative.   HENT: Negative.   Eyes: Negative.   Respiratory: Positive for cough.   Cardiovascular: Negative.   Gastrointestinal: Negative.   Endocrine: Negative.   Genitourinary: Negative.   Musculoskeletal: Negative.   Skin: Negative.   Allergic/Immunologic: Negative.   Neurological: Negative.   Hematological: Negative.   Psychiatric/Behavioral: Negative.   All other systems reviewed and are negative.      Objective:   Physical Exam  Constitutional: He is oriented to person, place, and time. He appears well-developed and well-nourished. No distress.  HENT:  Head: Normocephalic and atraumatic.  Eyes: Conjunctivae and EOM are normal. Pupils are equal, round, and reactive to light.  Neck:  Reduced ROM, increased contraction of Left platysma  Cardiovascular: Normal rate, regular rhythm and normal heart sounds.  Exam reveals no friction rub.   No murmur heard. Pulmonary/Chest: Effort normal and breath sounds normal. No respiratory  distress. He has no wheezes.  Abdominal: Soft. Bowel sounds are normal. He exhibits no distension. There is no tenderness.  Neurological: He is alert and oriented to person, place, and time. Coordination abnormal.  Reflex Scores:      Tricep reflexes are 2+ on the right side and 1+ on the left side.      Bicep reflexes are 2+ on the right side and 1+ on the left side.      Brachioradialis reflexes are 2+ on the right side and 1+ on the left side.      Patellar reflexes are 3+ on the right side and 2+ on the left side.      Achilles reflexes are 3+ on the right side and 2+ on the left side. MAS 0 in R biceps and triceps and finger flexors, 2 at the R pronator  MAS 2 at R quad, 1 R hamstring  Skin: He is not diaphoretic.  Psychiatric: He has a normal mood and affect.  Nursing note and vitals reviewed.         Assessment & Plan:  1. Right spastic hemiplegia secondary to left pontine infarct. His primary concern is equina varus spasticity right lower limb leading to spastic foot drop. He ambulates in an inverted position which is only partially corrected by AFO.  Patient also exhibits stiff knee gait with increased quad activation. We discussed that controlling the equina varus spasticity may help relieve the quad spasticity.  The plan would be to perform phenol tibial nerve block on the right side. Reassess tone, particularly with knee extension and if needed also perform Botox injection 2-3 weeks after phenol.  2. 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 At this time we will not treat this, but may benefit from Botox for this condition as well

## 2017-02-07 NOTE — Patient Instructions (Signed)
Peripheral Nerve Block  What is a peripheral nerve block?  A peripheral nerve block is a method of using a type of medicine that is injected into an area of the body to numb everything below the injection site (regional anesthetic). The medicine is injected around the nerve that provides feeling to the area where you will have a surgical procedure done. A peripheral nerve block is done so that you do not feel any pain during your procedure.  You may be numb for up to 24 hours after your peripheral nerve block is done, depending on the type of medicine used. A peripheral nerve block can be extended to relieve pain for a longer period by leaving a thin tube (catheter) inserted near your nerve after your procedure. More numbing medicine can be given through the catheter for up to several days.  What are some reasons for having a peripheral nerve block?  You may have a peripheral nerve block to relieve pain associated with many types of procedures, such as surgery on any parts of your limbs, your hip, your shoulder, or your head. A peripheral nerve block may be done if you are not able to receive medicine to make you fall asleep (general anesthetic) during your procedure.  What are the risks of a peripheral nerve block?  Generally, peripheral nerve blocks are safe. However, problems may occur, including:  · Infection at the injection site.  · Bleeding.  · Allergic reactions to medicines.  · Damage to other structures or organs, such as the nerve that is being blocked. Nerve damage can be temporary or permanent.  · Bruising.  · Pain.     What are the benefits of a peripheral nerve block?  The main benefit of a peripheral nerve block is that you will not feel pain during your procedure, and you will not be exposed to the risks associated with receiving a general anesthetic. Other benefits may include:  · Reduced need for pain medicine after your procedure.  · Fewer side effects from pain medicine that you take after your  procedure.  · Lower risk of blood clots.  · Faster recovery.     How is a peripheral nerve block performed?  · An IV tube will be inserted into your hand or your arm.  · You may be given a medicine to help you relax (sedative).  · Your nerve may be located by using:  ? An ultrasound. This is an imaging test that uses sound waves to create an image of the area.  ? A nerve stimulator. This is a device that activates the nerve and causes your muscles to twitch.  · The area near your injection site will be cleaned with a germ-killing (antiseptic) solution.  · Medicine to numb your injection area (local anesthetic) may be injected into the tissue above your nerve.  · Regional anesthetic will be injected into the area near your nerve.  ? The medicine will be injected around the nerve, not into it.  ? You should not feel any pain during this injection.  · A catheter may be inserted through the needle and left near the nerve when the needle is removed.  · Surgery can begin once the area of your peripheral nerve block is completely numb.  The procedure may vary among health care providers and hospitals.  How can I expect to feel after a peripheral nerve block?  The area where the medicine is injected will be completely numb. You should not feel any   pain during your procedure. The area of the peripheral nerve block may continue to feel numb after surgery. As the medicine wears off, feeling will gradually return to the area.  When you have a numb body part, you have a greater risk of injuring it because you have no sensation to tell you when something hurts. To reduce your risk of injury when you have a peripheral nerve block:  · Be very careful when exposing numb body parts to heat or cold.  · Do not lift heavy items.  · Do not stand up or try to walk without help if you have a nerve block in one or both legs.     Seek medical care if:  · You develop redness, swelling, or pain around your injection site.  · You have fluid or  blood coming from your injection site.  · Your injection site feels warm to the touch.  · You have pus or a bad smell coming from your injection site.  · You have pain near your injection site that gets worse.  · You continue to have numbness, weakness, or tingling after your medicine has worn off.  This information is not intended to replace advice given to you by your health care provider. Make sure you discuss any questions you have with your health care provider.  Document Released: 11/30/2007 Document Revised: 07/20/2016 Document Reviewed: 04/29/2015  Elsevier Interactive Patient Education © 2017 [employer] Inc.   

## 2017-02-08 ENCOUNTER — Ambulatory Visit: Payer: Medicare Other | Attending: Neurology | Admitting: Occupational Therapy

## 2017-02-08 ENCOUNTER — Encounter: Payer: Self-pay | Admitting: Occupational Therapy

## 2017-02-08 DIAGNOSIS — R293 Abnormal posture: Secondary | ICD-10-CM | POA: Diagnosis present

## 2017-02-08 DIAGNOSIS — R2681 Unsteadiness on feet: Secondary | ICD-10-CM | POA: Diagnosis present

## 2017-02-08 DIAGNOSIS — I69353 Hemiplegia and hemiparesis following cerebral infarction affecting right non-dominant side: Secondary | ICD-10-CM | POA: Insufficient documentation

## 2017-02-08 NOTE — Therapy (Signed)
[facility] [address] [place], [place], [address] Phone: [phone]   Fax:  [phone]  Occupational Therapy Treatment  Patient Details  Name: [name] MRN: [id number] Date of Birth: 01-14-1941 Referring Provider: Dr. Porfirio Mylar Dohmeier  Encounter Date: 02/08/2017      OT End of Session - 02/08/17 1549    Visit Number 4   Number of Visits 8   Date for OT Re-Evaluation 02/24/17   Authorization Type medicare will need G code and PN every 10th visit   Authorization Time Period 60 days   OT Start Time 1447   OT Stop Time 1540   OT Time Calculation (min) 53 min   Activity Tolerance Patient tolerated treatment well      Past Medical History:  Diagnosis Date  . CKD (chronic kidney disease), stage II   . CVA (cerebral infarction)    Left thalamic, right-sided weakness wih aphagia, right leg brace  . Dyslipidemia   . Elevated PSA    biopsy negative in 2008  . GERD (gastroesophageal reflux disease)   . Hypertension    without medication since 2006  . Major depression   . Seasonal rhinitis     Past Surgical History:  Procedure Laterality Date  . NASAL SEPTUM SURGERY    . VASECTOMY      There were no vitals filed for this visit.      Subjective Assessment - 02/08/17 1454    Subjective  We worked on standing at home   Patient is accompained by: Family member  dtr   Pertinent History Pt with CVA 13 years ago; pt's wife passed away earlier this year and pt underwent decline with increase in falls. PT recommeneded OT eval.    Patient Stated Goals I don't know- I am not sure what OT can do for me   Currently in Pain? No/denies                      OT Treatments/Exercises (OP) - 02/08/17 0001      Neurological Re-education Exercises   Other Exercises 1 Neuro re ed to address trunk and R shoulder girdle alignment thru activation of trunk and scapula in supine and then in L sidelying.   Transitioned into sitting and addressed upper trunk dissociation from lower trunk with rotation and decreasing holding through LUE and head.  Transitioned into squatting with BUE"s in forward weight bearing moving to RUE in weight bearing and supporting with LUE in reaching activity.  Progressed to standing/stepping with emphasis on increasing activity of trunk, RLE and RUE to decrease abnormal postruring of RUE with functional ambulation.  Pt has "learned" compensations and is hesistant to put weight on RLE, holds with LUE shoulder and head. WIth facilitation pt is able to activate RLE and trunk in standing and achieve more symmetrical functional ambulation.                       OT Long Term Goals - 02/08/17 1547      OT LONG TERM GOAL #1   Title Pt and family will be  mod I with home activities program - 02/24/2017   Status On-going     OT LONG TERM GOAL #2   Title Pt will demonstrate ability to use RUE as non dominant in simple ADL tasks   Status On-going     OT LONG TERM GOAL #3   Title Pt will demonstrate ability to  carry light weight object in R hand while ambulating   Status On-going     OT LONG TERM GOAL #4   Title Pt will be mod I with simple snack/cold meal prep   Status On-going               Plan - 02/08/17 1547    Clinical Impression Statement Pt with good progress toward improved trunk control, increased activation in RLE and overal decreased tightness in RUE.  Pt will need significant repetition for motor learning.    Rehab Potential Fair   Current Impairments/barriers affecting progress: length of time since CVA,    OT Frequency 2x / week   OT Duration 4 weeks   OT Treatment/Interventions Self-care/ADL training;Neuromuscular education;Therapeutic exercise;Passive range of motion;Manual Therapy;Functional Mobility Training;Splinting;Therapeutic exercises;Therapeutic activities;Balance training;Patient/family education   Plan Neuro re ed to address  trunk, RUE, balance, postural alignment and control, functional ambulation   Consulted and Agree with Plan of Care Patient;Family member/caregiver   Family Member Consulted dtr      Patient will benefit from skilled therapeutic intervention in order to improve the following deficits and impairments:  Abnormal gait, Decreased activity tolerance, Decreased balance, Decreased cognition, Decreased range of motion, Decreased mobility, Decreased strength, Difficulty walking, Impaired UE functional use, Impaired tone, Impaired perceived functional ability  Visit Diagnosis: 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 Spastic hemiplegia affecting dominant side (HCC) 02/07/2017  . 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 Right foot drop 12/02/2016  . 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 Depression due to dementia 01/28/2015  . 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 Hemiplegia following CVA (cerebrovascular accident) (HCC) 05/31/2014  . 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 Left pontine stroke (HCC) 05/31/2014  . MCI (mild cognitive impairment) with memory loss 05/31/2014    Norton PastelPulaski, Mathea Frieling Halliday, OTR/L 02/08/2017, 3:50 PM  [facility] [address] [place], [place], [address] Phone: [phone]   Fax:  [phone]  Name: [name] MRN: [id number] Date of Birth: 12-10-1940

## 2017-02-10 ENCOUNTER — Ambulatory Visit: Payer: Medicare Other | Admitting: Occupational Therapy

## 2017-02-10 ENCOUNTER — Encounter: Payer: Self-pay | Admitting: Occupational Therapy

## 2017-02-10 VITALS — BP 142/95

## 2017-02-10 DIAGNOSIS — R2681 Unsteadiness on feet: Secondary | ICD-10-CM

## 2017-02-10 DIAGNOSIS — I69353 Hemiplegia and hemiparesis following cerebral infarction affecting right non-dominant side: Secondary | ICD-10-CM

## 2017-02-10 DIAGNOSIS — R293 Abnormal posture: Secondary | ICD-10-CM

## 2017-02-10 NOTE — Therapy (Signed)
[facility] [address] [place], [place], [address] Phone: [phone]   Fax:  [phone]  Occupational Therapy Treatment  Patient Details  Name: [name] MRN: [id number] Date of Birth: 03/18/1941 Referring Provider: Dr. Porfirio Mylar Dohmeier  Encounter Date: 02/10/2017      OT End of Session - 02/10/17 1155    Visit Number 5   Number of Visits 8   Date for OT Re-Evaluation 02/24/17   Authorization Type medicare will need G code and PN every 10th visit   Authorization Time Period 60 days   OT Start Time 0932   OT Stop Time 1017   OT Time Calculation (min) 45 min   Activity Tolerance Patient tolerated treatment well      Past Medical History:  Diagnosis Date  . CKD (chronic kidney disease), stage II   . CVA (cerebral infarction)    Left thalamic, right-sided weakness wih aphagia, right leg brace  . Dyslipidemia   . Elevated PSA    biopsy negative in 2008  . GERD (gastroesophageal reflux disease)   . Hypertension    without medication since 2006  . Major depression   . Seasonal rhinitis     Past Surgical History:  Procedure Laterality Date  . NASAL SEPTUM SURGERY    . VASECTOMY      Vitals:   02/10/17 0936  BP: (!) 142/95        Subjective Assessment - 02/10/17 1149    Subjective  I fell at home but I didn't get hurt   Patient is accompained by: Family member  dtr   Pertinent History Pt with CVA 13 years ago; pt's wife passed away earlier this year and pt underwent decline with increase in falls. PT recommeneded OT eval.    Patient Stated Goals I don't know- I am not sure what OT can do for me   Currently in Pain? No/denies                      OT Treatments/Exercises (OP) - 02/10/17 0001      Neurological Re-education Exercises   Other Exercises 1 Neuro re ed to address RLE, trunk, RUE alignment in active sitting, sit to stand, static standing, weight shifting in standing, and  stepping. Pt initially needs max facilitation , mod cues but with repetition and practice pt able to progress to light min facilitation. Pt will require significant practice and repetition for carry over into functional activity to improve UE use, improve functional ambulation and decrease fall risk however pt has good potential for improvement                      OT Long Term Goals - 02/10/17 1153      OT LONG TERM GOAL #1   Title Pt and family will be  mod I with home activities program - 02/24/2017   Status On-going     OT LONG TERM GOAL #2   Title Pt will demonstrate ability to use RUE as non dominant in simple ADL tasks   Status On-going     OT LONG TERM GOAL #3   Title Pt will demonstrate ability to carry light weight object in R hand while ambulating   Status On-going     OT LONG TERM GOAL #4   Title Pt will be mod I with simple snack/cold meal prep   Status On-going  Plan - 02/10/17 1153    Clinical Impression Statement Pt making progress toward goals.  Pt able to demonstrate improvement in single therapy session however has difficulty with carry over. Will benefit from repetition.    Rehab Potential Fair   Current Impairments/barriers affecting progress: length of time since CVA,    OT Frequency 2x / week   OT Duration 4 weeks   OT Treatment/Interventions Self-care/ADL training;Neuromuscular education;Therapeutic exercise;Passive range of motion;Manual Therapy;Functional Mobility Training;Splinting;Therapeutic exercises;Therapeutic activities;Balance training;Patient/family education   Plan NMR for trunk, RUE, balance, postural alignment and control, functional ambulation   Consulted and Agree with Plan of Care Patient;Family member/caregiver   Family Member Consulted dtr      Patient will benefit from skilled therapeutic intervention in order to improve the following deficits and impairments:  Abnormal gait, Decreased activity tolerance,  Decreased balance, Decreased cognition, Decreased range of motion, Decreased mobility, Decreased strength, Difficulty walking, Impaired UE functional use, Impaired tone, Impaired perceived functional ability  Visit Diagnosis: 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 Spastic hemiplegia affecting dominant side (HCC) 02/07/2017  . 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 Right foot drop 12/02/2016  . 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 Depression due to dementia 01/28/2015  . 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 Hemiplegia following CVA (cerebrovascular accident) (HCC) 05/31/2014  . 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 Left pontine stroke (HCC) 05/31/2014  . MCI (mild cognitive impairment) with memory loss 05/31/2014    Norton Pastelulaski, Khristian Seals Halliday, OTR/L 02/10/2017, 11:56 AM  [facility] [address] [place], [place], [address] Phone: [phone]   Fax:  [phone]  Name: Dennis FavaDonnie Graham MRN: [id number] Date of Birth: [birth year]

## 2017-02-14 ENCOUNTER — Encounter: Payer: Self-pay | Admitting: Occupational Therapy

## 2017-02-14 ENCOUNTER — Ambulatory Visit: Payer: Medicare Other | Admitting: Occupational Therapy

## 2017-02-14 DIAGNOSIS — I69353 Hemiplegia and hemiparesis following cerebral infarction affecting right non-dominant side: Secondary | ICD-10-CM

## 2017-02-14 DIAGNOSIS — R293 Abnormal posture: Secondary | ICD-10-CM

## 2017-02-14 DIAGNOSIS — R2681 Unsteadiness on feet: Secondary | ICD-10-CM

## 2017-02-14 NOTE — Therapy (Signed)
[facility] [address] [place], [place], [address] Phone: [phone]   Fax:  [phone]  Occupational Therapy Treatment  Patient Details  Name: [name] MRN: [id number] Date of Birth: 08-Feb-1941 Referring Provider: Dr. Porfirio Mylar Dohmeier  Encounter Date: 02/14/2017      OT End of Session - 02/14/17 1632    Visit Number 6   Number of Visits 8   Date for OT Re-Evaluation 02/24/17   Authorization Type medicare will need G code and PN every 10th visit   Authorization Time Period 60 days   OT Start Time 1540   OT Stop Time 1623   OT Time Calculation (min) 43 min   Activity Tolerance Patient tolerated treatment well   Behavior During Therapy [facility] for tasks assessed/performed      Past Medical History:  Diagnosis Date  . CKD (chronic kidney disease), stage II   . CVA (cerebral infarction)    Left thalamic, right-sided weakness wih aphagia, right leg brace  . Dyslipidemia   . Elevated PSA    biopsy negative in 2008  . GERD (gastroesophageal reflux disease)   . Hypertension    without medication since 2006  . Major depression   . Seasonal rhinitis     Past Surgical History:  Procedure Laterality Date  . NASAL SEPTUM SURGERY    . VASECTOMY      There were no vitals filed for this visit.      Subjective Assessment - 02/14/17 1544    Subjective  I can sit down better, and stand up,   Patient is accompained by: Family member   Pertinent History Pt with CVA 13 years ago; pt's wife passed away earlier this year and pt underwent decline with increase in falls. PT recommeneded OT eval.    Patient Stated Goals I don't know- I am not sure what OT can do for me   Currently in Pain? No/denies   Pain Score 0-No pain                      OT Treatments/Exercises (OP) - 02/14/17 0001      Neurological Re-education Exercises   Other Exercises 1 Neuromuscular reeducation to address postural control  in sitting, during transitions (sit to / from stand) and standing- static to dynamic.  Patient with hesitation to shift his weight forward and to right side.  Patient able to verbalize- it feels like I am falling, but also able to allow repetitive weight shifts in those directions with decreasing assistance.  Patient even able to replicate such weight shifts immediately follwoing facilitation.  Daughter put hands on to assist, and able to recognize when patient uncomfortable, and give time, and great cueing to allow him to safely shift weight.                  OT Education - 02/14/17 1632    Education provided Yes   Education Details weight shift forward and to right in sitting, standing, and dynamic standing   Person(s) Educated Patient;Child(ren)   Methods Explanation;Demonstration;Tactile cues;Verbal cues   Comprehension Need further instruction;Verbalized understanding             OT Long Term Goals - 02/10/17 1153      OT LONG TERM GOAL #1   Title Pt and family will be  mod I with home activities program - 02/24/2017   Status On-going     OT LONG TERM GOAL #2  Title Pt will demonstrate ability to use RUE as non dominant in simple ADL tasks   Status On-going     OT LONG TERM GOAL #3   Title Pt will demonstrate ability to carry light weight object in R hand while ambulating   Status On-going     OT LONG TERM GOAL #4   Title Pt will be mod I with simple snack/cold meal prep   Status On-going               Plan - 02/14/17 1633    Clinical Impression Statement Patient and his daughter are both seeing improvement in transitional movement, and functional mobility in home setting as well as in clinic.   Rehab Potential Fair   Current Impairments/barriers affecting progress: length of time since CVA,    OT Frequency 2x / week   OT Duration 4 weeks   OT Treatment/Interventions Self-care/ADL training;Neuromuscular education;Therapeutic exercise;Passive range of  motion;Manual Therapy;Functional Mobility Training;Splinting;Therapeutic exercises;Therapeutic activities;Balance training;Patient/family education   Plan NMR trunk, initiation of movement with transitions, postural control, functional ambulation   Consulted and Agree with Plan of Care Patient;Family member/caregiver   Family Member Consulted dtr      Patient will benefit from skilled therapeutic intervention in order to improve the following deficits and impairments:  Abnormal gait, Decreased activity tolerance, Decreased balance, Decreased cognition, Decreased range of motion, Decreased mobility, Decreased strength, Difficulty walking, Impaired UE functional use, Impaired tone, Impaired perceived functional ability  Visit Diagnosis: 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 Spastic hemiplegia affecting dominant side (HCC) 02/07/2017  . 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 Right foot drop 12/02/2016  . 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 Depression due to dementia 01/28/2015  . 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 Hemiplegia following CVA (cerebrovascular accident) (HCC) 05/31/2014  . 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 Left pontine stroke (HCC) 05/31/2014  . MCI (mild cognitive impairment) with memory loss 05/31/2014    Collier SalinaGellert, Trivia Heffelfinger M 02/14/2017, 4:36 PM  [facility] [address] [place], [place], [address] Phone: [phone]   Fax:  [phone]  Name: Angie FavaDonnie Turek MRN: [id number] Date of Birth: 06-06-1941

## 2017-02-16 ENCOUNTER — Encounter: Payer: Self-pay | Admitting: Occupational Therapy

## 2017-02-16 ENCOUNTER — Ambulatory Visit: Payer: Medicare Other | Admitting: Occupational Therapy

## 2017-02-16 DIAGNOSIS — R293 Abnormal posture: Secondary | ICD-10-CM

## 2017-02-16 DIAGNOSIS — R2681 Unsteadiness on feet: Secondary | ICD-10-CM

## 2017-02-16 DIAGNOSIS — I69353 Hemiplegia and hemiparesis following cerebral infarction affecting right non-dominant side: Secondary | ICD-10-CM | POA: Diagnosis not present

## 2017-02-16 NOTE — Therapy (Signed)
[facility] [address] [place], [place], [address] Phone: [phone]   Fax:  [phone]  Occupational Therapy Treatment  Patient Details  Name: [name] MRN: [id number] Date of Birth: 1940-10-15 Referring Provider: Dr. Porfirio Mylararmen Dohmeier  Encounter Date: 02/16/2017      OT End of Session - 02/16/17 1212    Visit Number 7   Number of Visits 8   Date for OT Re-Evaluation 02/24/17   Authorization Type medicare will need G code and PN every 10th visit   Authorization Time Period 60 days   OT Start Time 1103   OT Stop Time 1145   OT Time Calculation (min) 42 min   Activity Tolerance Patient tolerated treatment well   Behavior [facility] Therapy [facility] for tasks assessed/performed      Past Medical History:  Diagnosis Date  . CKD (chronic kidney disease), stage II   . CVA (cerebral infarction)    Left thalamic, right-sided weakness wih aphagia, right leg brace  . Dyslipidemia   . Elevated PSA    biopsy negative in 2008  . GERD (gastroesophageal reflux disease)   . Hypertension    without medication since 2006  . Major depression   . Seasonal rhinitis     Past Surgical History:  Procedure Laterality Date  . NASAL SEPTUM SURGERY    . VASECTOMY      There were no vitals filed for this visit.      Subjective Assessment - 02/16/17 1203    Subjective  I don't use it for much of anything (regarding right hand)   Patient is accompained by: Family member   Pertinent History Pt with CVA 13 years ago; pt's wife passed away earlier this year and pt underwent decline with increase in falls. PT recommeneded OT eval.    Patient Stated Goals I don't know- I am not sure what OT can do for me   Currently in Pain? No/denies   Pain Score 0-No pain                      OT Treatments/Exercises (OP) - 02/16/17 0001      ADLs   Leisure Patient and daughter enjoy dancing, and worked with them to assess safety  for dancing at home.  Patient is going to a family reunion this weekend, and wants to be able to dance if the chance arises.  Patient's daughter does an excellent job anticiapating where patient may have difficulty, and together they were able to complete several dance steps safely.       Neurological Re-education Exercises   Other Exercises 1 Neuromuscular reeducation to address postural control in sitting and standing/stepping.  Patient with right trunk shortening and hip rotation with attempts to step with left foot.  With facilitation, able to realign postural set,a nd patient then able to correct himself with verbal cueing.     Other Exercises 2 Dynamic sitting to address lower trunk initiated weight shifts in preparation for sit to stand.  Patient tends to overuse back extesors, and hike shoulders, but with cueing able to produce movement from hips, and base of support.  Worked to actively control  reach while seated- shoulder flexion with elbow extension.  Patient has learned no use of non dominant right arm, but with cueing isolates movement very effectively.                  OT Education - 02/16/17 1212  Education provided Yes   Education Details learned non use as realted to right UE   Person(s) Educated Patient;Child(ren)   Methods Explanation   Comprehension Verbalized understanding             OT Long Term Goals - 02/10/17 1153      OT LONG TERM GOAL #1   Title Pt and family will be  mod I with home activities program - 02/24/2017   Status On-going     OT LONG TERM GOAL #2   Title Pt will demonstrate ability to use RUE as non dominant in simple ADL tasks   Status On-going     OT LONG TERM GOAL #3   Title Pt will demonstrate ability to carry light weight object in R hand while ambulating   Status On-going     OT LONG TERM GOAL #4   Title Pt will be mod I with simple snack/cold meal prep   Status On-going               Plan - 02/16/17 1212     Clinical Impression Statement Patient continues to show steady progress toward OT goals.     Rehab Potential Fair   Current Impairments/barriers affecting progress: length of time since CVA,    OT Frequency 2x / week   OT Duration 4 weeks   OT Treatment/Interventions Self-care/ADL training;Neuromuscular education;Therapeutic exercise;Passive range of motion;Manual Therapy;Functional Mobility Training;Splinting;Therapeutic exercises;Therapeutic activities;Balance training;Patient/family education   Plan NMR trunk, inititation of movement during transitions, postural control, begin to check goals   Consulted and Agree with Plan of Care Patient;Family member/caregiver   Family Member Consulted dtr- [name]      Patient will benefit from skilled therapeutic intervention in order to improve the following deficits and impairments:  Abnormal gait, Decreased activity tolerance, Decreased balance, Decreased cognition, Decreased range of motion, Decreased mobility, Decreased strength, Difficulty walking, Impaired UE functional use, Impaired tone, Impaired perceived functional ability  Visit Diagnosis: 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 Spastic hemiplegia affecting dominant side (HCC) 02/07/2017  . 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 Right foot drop 12/02/2016  . 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 Depression due to dementia 01/28/2015  . 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 Hemiplegia following CVA (cerebrovascular accident) (HCC) 05/31/2014  . 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 Left pontine stroke (HCC) 05/31/2014  . MCI (mild  cognitive impairment) with memory loss 05/31/2014    Collier Salina, OTR/L 02/16/2017, 12:15 PM  [facility] [address] [place], [place], [address] Phone: [phone]   Fax:  [phone]  Name: [name] MRN: [id number] Date of Birth: [birth year]

## 2017-02-22 ENCOUNTER — Ambulatory Visit: Payer: Medicare Other | Admitting: Occupational Therapy

## 2017-02-22 ENCOUNTER — Encounter: Payer: Self-pay | Admitting: Occupational Therapy

## 2017-02-22 DIAGNOSIS — R293 Abnormal posture: Secondary | ICD-10-CM

## 2017-02-22 DIAGNOSIS — R2681 Unsteadiness on feet: Secondary | ICD-10-CM

## 2017-02-22 DIAGNOSIS — I69353 Hemiplegia and hemiparesis following cerebral infarction affecting right non-dominant side: Secondary | ICD-10-CM

## 2017-02-22 NOTE — Therapy (Signed)
[facility] [address] [place], [place], [address] Phone: [phone]   Fax:  [phone]  Occupational Therapy Treatment  Patient Details  Name: Dennis FavaDonnie Graham MRN: [id number] Date of Birth: 03-12-1941 Referring Provider: Dr. Porfirio Mylararmen Dohmeier  Encounter Date: 02/22/2017      OT End of Session - 02/22/17 1201    Visit Number 8   Number of Visits 24   Date for OT Re-Evaluation 04/19/17   Authorization Type medicare will need G code and PN every 10th visit   Authorization Time Period 60 days   Authorization - Visit Number 8   Authorization - Number of Visits 10   OT Start Time 1016   OT Stop Time 1100   OT Time Calculation (min) 44 min   Activity Tolerance Patient tolerated treatment well      Past Medical History:  Diagnosis Date  . CKD (chronic kidney disease), stage II   . CVA (cerebral infarction)    Left thalamic, right-sided weakness wih aphagia, right leg brace  . Dyslipidemia   . Elevated PSA    biopsy negative in 2008  . GERD (gastroesophageal reflux disease)   . Hypertension    without medication since 2006  . Major depression   . Seasonal rhinitis     Past Surgical History:  Procedure Laterality Date  . NASAL SEPTUM SURGERY    . VASECTOMY      There were no vitals filed for this visit.      Subjective Assessment - 02/22/17 1023    Subjective  It's a good day today   Patient is accompained by: Family member  dtr   Pertinent History Pt with CVA 13 years ago; pt's wife passed away earlier this year and pt underwent decline with increase in falls. PT recommeneded OT eval.    Patient Stated Goals I don't know- I am not sure what OT can do for me   Currently in Pain? No/denies                      OT Treatments/Exercises (OP) - 02/22/17 0001      ADLs   ADL Comments Checked remaining goals - see goals for udpate. Pt has made significant progress and discussed renewing pt  with additional goals and POC - pt and dtr in agreement.      Neurological Re-education Exercises   Other Exercises 1 Neuro re ed to address postural alignment and trunk control in standing as well as functional ambulation.  Also addressed in sitting to incorporate A/P and lateral weight shifts with emphasis on more normal movement patterns and increased activation of R side.  Pt able to complete lower trunk intiated movements with initial facilitation and cueing.  Progressd to addressing carrying light weight object in R hand - pt able to do functionally however needs cues to increase weight on RLE as well as relax RUE while ambulating in order to carry object.  Pt benefits from significant repetition for motor learning.                   OT Short Term Goals - 02/22/17 1147      OT SHORT TERM GOAL #1   Title Pt and dtr will be mod I with upgraded home activities program - 03/22/2017   Status New     OT SHORT TERM GOAL #2   Title Pt will demonstrate ability to reach into mid level shelf for light weight  object with LUE in good postural alignment   Status New     OT SHORT TERM GOAL #3   Title Pt will demonstrate ability to tolerated approximately 25 minutes of ambulatory activity without rest breaks to decrease fall risk   Status New     OT SHORT TERM GOAL #4   Title Pt will demonstrate ability to complete simple bilateral activities in standing exhibiting good postural alignment   Status New           OT Long Term Goals - 02/22/17 1146      OT LONG TERM GOAL #1   Title Pt and family will be  mod I with home activities program - 02/24/2017   Status Achieved     OT LONG TERM GOAL #2   Title Pt will demonstrate ability to use RUE as non dominant in simple ADL tasks   Status Achieved     OT LONG TERM GOAL #3   Title Pt will demonstrate ability to carry light weight object in R hand while ambulating   Status Achieved     OT LONG TERM GOAL #4   Title Pt will be mod I  with simple snack/cold meal prep   Status Achieved     OT LONG TERM GOAL #5   Title Pt and dtr will be mod I with upgraded home activities program - 04/19/2017   Status New     OT LONG TERM GOAL #6   Title Pt will be  mod I in putting dishes away in cabinets   Status New     OT LONG TERM GOAL #7   Title Pt will demonstrate ability to pick up light weight object from floor with no more than supervision   Status New     OT LONG TERM GOAL #8   Title Pt will demonstrate ability to push grocery cart with both UE's in store while assisting dtr in simple shopping task.      OT LONG TERM GOAL  #9   Baseline Pt will demonstrate ability to carry at least 2 pound object in R hand while ambulating.   Status New               Plan - 02/22/17 1159    Clinical Impression Statement Pt has made excellent progress in therapy and shows good potential for futher progress. Pt and dtr have agreed to extension of therapy and new goals and POC.   Rehab Potential Fair   Current Impairments/barriers affecting progress: length of time since CVA, learned habits   OT Frequency 2x / week   OT Duration 8 weeks   OT Treatment/Interventions Self-care/ADL training;Neuromuscular education;Therapeutic exercise;Passive range of motion;Manual Therapy;Functional Mobility Training;Splinting;Therapeutic exercises;Therapeutic activities;Balance training;Patient/family education   Plan NMR trunk, initiation of movement during transitions, postural control in dynamic standing and functional ambulation, functional  mobility, functional use of RUE   Consulted and Agree with Plan of Care Patient;Family member/caregiver   Family Member Consulted dtr- [name]      Patient will benefit from skilled therapeutic intervention in order to improve the following deficits and impairments:  Abnormal gait, Decreased activity tolerance, Decreased balance, Decreased cognition, Decreased range of motion, Decreased mobility, Decreased  strength, Difficulty walking, Impaired UE functional use, Impaired tone, Impaired perceived functional ability  Visit Diagnosis: 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 Spastic hemiplegia affecting dominant side (HCC) 02/07/2017  . 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 Right foot drop 12/02/2016  . 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 Depression due to dementia 01/28/2015  . 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 Hemiplegia following CVA (cerebrovascular accident) (HCC) 05/31/2014  . 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 Left pontine stroke (HCC) 05/31/2014  . MCI (mild cognitive impairment) with memory loss 05/31/2014    Norton Pastel, OTR/L 02/22/2017, 12:09 PM  [facility] [address] [place], [place], [address] Phone: [phone]   Fax:  [phone]  Name: [name] MRN: [id number] Date of Birth: May 12, 1941

## 2017-02-25 ENCOUNTER — Ambulatory Visit (HOSPITAL_BASED_OUTPATIENT_CLINIC_OR_DEPARTMENT_OTHER): Payer: Medicare Other | Admitting: Physical Medicine & Rehabilitation

## 2017-02-25 ENCOUNTER — Encounter: Payer: Self-pay | Admitting: Physical Medicine & Rehabilitation

## 2017-02-25 ENCOUNTER — Encounter: Payer: Self-pay | Admitting: Occupational Therapy

## 2017-02-25 ENCOUNTER — Ambulatory Visit: Payer: Medicare Other | Admitting: Occupational Therapy

## 2017-02-25 VITALS — BP 152/93 | HR 88

## 2017-02-25 DIAGNOSIS — R2681 Unsteadiness on feet: Secondary | ICD-10-CM

## 2017-02-25 DIAGNOSIS — I69353 Hemiplegia and hemiparesis following cerebral infarction affecting right non-dominant side: Secondary | ICD-10-CM

## 2017-02-25 DIAGNOSIS — G8110 Spastic hemiplegia affecting unspecified side: Secondary | ICD-10-CM

## 2017-02-25 DIAGNOSIS — R293 Abnormal posture: Secondary | ICD-10-CM

## 2017-02-25 NOTE — Patient Instructions (Signed)
Coordination exercises:  Pick up penny's and place in piggy back. Pick up 5 pennies, one at a time, and place in bank one at a time. Use index finger and thumb to place in bank.  Rotate two steel balls in hand, change direction  Shuffle and deal cards. Flip cards over one at a time Rotate cards in fingers  Rotate a pen in fingers Click pen open and closed (ball point)  Complete 5 min at time / daily Do not allow to become frustrated.

## 2017-02-25 NOTE — Therapy (Signed)
[facility] [address] [place], [place], [address] Phone: [phone]   Fax:  [phone]  Occupational Therapy Treatment  Patient Details  Name: Dennis FavaDonnie Graham MRN: [id number] Date of Birth: 07-09-1941 Referring Provider: Dr. Porfirio Mylararmen Dohmeier  Encounter Date: 02/25/2017      OT End of Session - 02/25/17 1544    Visit Number 9   Number of Visits 24   Date for OT Re-Evaluation 04/19/17   Authorization Type medicare will need G code and PN every 10th visit   Authorization Time Period 60 days   Authorization - Visit Number 9   Authorization - Number of Visits 10   OT Start Time 1430   OT Stop Time 1528   OT Time Calculation (min) 58 min   Activity Tolerance Patient tolerated treatment well   Behavior [facility] for tasks assessed/performed      Past Medical History:  Diagnosis Date  . CKD (chronic kidney disease), stage II   . CVA (cerebral infarction)    Left thalamic, right-sided weakness wih aphagia, right leg brace  . Dyslipidemia   . Elevated PSA    biopsy negative in 2008  . GERD (gastroesophageal reflux disease)   . Hypertension    without medication since 2006  . Major depression   . Seasonal rhinitis     Past Surgical History:  Procedure Laterality Date  . NASAL SEPTUM SURGERY    . VASECTOMY      There were no vitals filed for this visit.      Subjective Assessment - 02/25/17 1538    Subjective  I got my shot - nerve block in right leg   Patient is accompained by: Family member   Pertinent History Pt with CVA 13 years ago; pt's wife passed away earlier this year and pt underwent decline with increase in falls. PT recommeneded OT eval.    Patient Stated Goals I don't know- I am not sure what OT can do for me   Currently in Pain? No/denies   Pain Score 0-No pain                      OT Treatments/Exercises (OP) - 02/25/17 0001      Fine Motor Coordination   Other  Fine Motor Exercises Patient has much movement available in right hand, but lacks ability to grade movement for fine motor control.  Issue HEP to address fine motor coordiantion, in hand manipulation, and forced use of right upper extremity.       Neurological Re-education Exercises   Other Exercises 1 Neuromuscular reeducation to address postural control, transitional movemnts and functional use of right upper extremity.  Worked on standing erect, and stepping with forward weigfht translation.  Worked to decrease rotation for forward stepping, and decreased use of upper exttremities.  Worked to carry can horizontally behind back to reinforce upright posture.  Pushing cane down toward knees behind back to reinforce forward flexion needed for stand to sit transiton.     Other Exercises 2 Worked on isolating hip motion in sitting - scooting forward and backward                OT Education - 02/25/17 1543    Education provided Yes   Education Details HEP FM coordination   Person(s) Educated Patient;Child(ren)   Methods Explanation;Demonstration;Handout   Comprehension Returned demonstration;Verbal cues required;Tactile cues required          OT Short Term  Goals - 02/25/17 1546      OT SHORT TERM GOAL #1   Title Pt and dtr will be mod I with upgraded home activities program - 03/22/2017   Status On-going     OT SHORT TERM GOAL #2   Title Pt will demonstrate ability to reach into mid level shelf for light weight object with LUE in good postural alignment   Status On-going     OT SHORT TERM GOAL #3   Title Pt will demonstrate ability to tolerated approximately 25 minutes of ambulatory activity without rest breaks to decrease fall risk   Status On-going     OT SHORT TERM GOAL #4   Title Pt will demonstrate ability to complete simple bilateral activities in standing exhibiting good postural alignment   Status On-going           OT Long Term Goals - 02/25/17 1546      OT  LONG TERM GOAL #1   Title Pt and family will be  mod I with home activities program - 02/24/2017   Status Achieved     OT LONG TERM GOAL #2   Title Pt will demonstrate ability to use RUE as non dominant in simple ADL tasks   Status Achieved     OT LONG TERM GOAL #3   Title Pt will demonstrate ability to carry light weight object in R hand while ambulating   Status Achieved     OT LONG TERM GOAL #4   Title Pt will be mod I with simple snack/cold meal prep   Status Achieved     OT LONG TERM GOAL #5   Title Pt and dtr will be mod I with upgraded home activities program - 04/19/2017   Status On-going     OT LONG TERM GOAL #6   Title Pt will be  mod I in putting dishes away in cabinets   Status On-going     OT LONG TERM GOAL #7   Title Pt will demonstrate ability to pick up light weight object from floor with no more than supervision   Status On-going     OT LONG TERM GOAL #8   Title Pt will demonstrate ability to push grocery cart with both UE's in store while assisting dtr in simple shopping task.    Status On-going     OT LONG TERM GOAL  #9   Baseline Pt will demonstrate ability to carry at least 2 pound object in R hand while ambulating.   Status On-going               Plan - 02/25/17 1544    Clinical Impression Statement Patient continues to show improved functional mobility, and active use of right extremities.  Patient has excellent family support.    Rehab Potential Fair   Current Impairments/barriers affecting progress: length of time since CVA, learned habits   OT Frequency 2x / week   OT Duration 8 weeks   OT Treatment/Interventions Self-care/ADL training;Neuromuscular education;Therapeutic exercise;Passive range of motion;Manual Therapy;Functional Mobility Training;Splinting;Therapeutic exercises;Therapeutic activities;Balance training;Patient/family education   Plan Functional use of RUE, NMR trunk, transitions / forward weight shifting   Consulted and Agree  with Plan of Care Patient;Family member/caregiver   Family Member Consulted dtr- [name]      Patient will benefit from skilled therapeutic intervention in order to improve the following deficits and impairments:  Abnormal gait, Decreased activity tolerance, Decreased balance, Decreased cognition, Decreased range of motion, Decreased mobility, Decreased strength, Difficulty walking,  Impaired UE functional use, Impaired tone, Impaired perceived functional ability  Visit Diagnosis: 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 Spastic hemiplegia affecting dominant side (HCC) 02/07/2017  . 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 Right foot drop 12/02/2016  . 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 Depression due to dementia 01/28/2015  . 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 Hemiplegia following CVA (cerebrovascular accident) (HCC) 05/31/2014  . 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 Left pontine stroke (HCC) 05/31/2014  . MCI (mild cognitive impairment) with memory loss 05/31/2014    Lanna Poche 02/25/2017, 3:47 PM  [facility] [address] [place], [place], [address] Phone: [phone]   Fax:  [phone]  Name: [name] MRN: [id number] Date of Birth: [birth year]

## 2017-02-25 NOTE — Progress Notes (Signed)
Phenol neurolysis of the Right tibial nerve  Indication: Severe spasticity in the plantar flexor muscles which is not responding to medical management and other conservative care and interfering with functional use.  Informed consent was obtained after describing the risks and benefits of the procedure with the patient this includes bleeding bruising and infection as well as medication side effects. The patient elected to proceed and has given written consent. Patient placed in a prone position on the exam table. External DC stimulation was applied to the popliteal space using a nerve stimulator. Plantar flexion twitch was obtained. The popliteal region was prepped with Betadine and then entered with a 22-gauge 40 mm needle electrode under electrical stimulation guidance. Plantar flexion which was obtained and confirmed. Then 4 cc of 5% phenol were injected. The patient tolerated procedure well. Post procedure instructions and followup visit were given. 

## 2017-02-28 ENCOUNTER — Ambulatory Visit: Payer: Medicare Other | Admitting: Occupational Therapy

## 2017-02-28 ENCOUNTER — Encounter: Payer: Self-pay | Admitting: Occupational Therapy

## 2017-02-28 DIAGNOSIS — R293 Abnormal posture: Secondary | ICD-10-CM

## 2017-02-28 DIAGNOSIS — R2681 Unsteadiness on feet: Secondary | ICD-10-CM

## 2017-02-28 DIAGNOSIS — I69353 Hemiplegia and hemiparesis following cerebral infarction affecting right non-dominant side: Secondary | ICD-10-CM | POA: Diagnosis not present

## 2017-02-28 NOTE — Therapy (Signed)
[facility] [address] [place], [place], [address] Phone: [phone]   Fax:  [phone]  Occupational Therapy Treatment  Patient Details  Name: [name] MRN: [id number] Date of Birth: 1941-02-01 Referring Provider: Dr. Porfirio Mylar Dohmeier  Encounter Date: 02/28/2017      OT End of Session - 02/28/17 1245    Visit Number 10   Number of Visits 24   Date for OT Re-Evaluation 04/19/17   Authorization Type medicare will need G code and PN every 10th visit   Authorization Time Period 60 days   Authorization - Visit Number 10   Authorization - Number of Visits 20   OT Start Time 1102   OT Stop Time 1145   OT Time Calculation (min) 43 min   Activity Tolerance Patient tolerated treatment well      Past Medical History:  Diagnosis Date  . CKD (chronic kidney disease), stage II   . CVA (cerebral infarction)    Left thalamic, right-sided weakness wih aphagia, right leg brace  . Dyslipidemia   . Elevated PSA    biopsy negative in 2008  . GERD (gastroesophageal reflux disease)   . Hypertension    without medication since 2006  . Major depression   . Seasonal rhinitis     Past Surgical History:  Procedure Laterality Date  . NASAL SEPTUM SURGERY    . VASECTOMY      There were no vitals filed for this visit.      Subjective Assessment - 02/28/17 1109    Subjective  I get tired when I work here   Patient is accompained by: Family member  dtr   Pertinent History Pt with CVA 13 years ago; pt's wife passed away earlier this year and pt underwent decline with increase in falls. PT recommeneded OT eval.    Patient Stated Goals I don't know- I am not sure what OT can do for me   Currently in Pain? No/denies                      OT Treatments/Exercises (OP) - 02/28/17 0001      Neurological Re-education Exercises   Other Exercises 1 Neuro re ed to address trunk control, sit to stand, postural  control with emphasis on decreasing R hip rotation and IR, increasing weight bearing and activity of R side of body. Introduced RW today as a strategy to allow pt to be more midline with standing and walker, to decrease abnormal trunk substitutions and to decease tone in RUE, trunk and RLE. Pt also with opportunity to use RUE in functional task of using walker with assists with decresaing abnormal posturing and tone in RUE.  Pt and dtr both understand rational for using walker in order to accomplish more global goals. Dtr states pt has walker at home and they can practice cues at home. Pt needs  min  facilitation and max vc's.                   OT Short Term Goals - 02/28/17 1243      OT SHORT TERM GOAL #1   Title Pt and dtr will be mod I with upgraded home activities program - 03/22/2017   Status On-going     OT SHORT TERM GOAL #2   Title Pt will demonstrate ability to reach into mid level shelf for light weight object with LUE in good postural alignment   Status On-going  OT SHORT TERM GOAL #3   Title Pt will demonstrate ability to tolerated approximately 25 minutes of ambulatory activity without rest breaks to decrease fall risk   Status On-going     OT SHORT TERM GOAL #4   Title Pt will demonstrate ability to complete simple bilateral activities in standing exhibiting good postural alignment   Status On-going           OT Long Term Goals - 03/10/2017 1243      OT LONG TERM GOAL #1   Title Pt and family will be  mod I with home activities program - 02/24/2017   Status Achieved     OT LONG TERM GOAL #2   Title Pt will demonstrate ability to use RUE as non dominant in simple ADL tasks   Status Achieved     OT LONG TERM GOAL #3   Title Pt will demonstrate ability to carry light weight object in R hand while ambulating   Status Achieved     OT LONG TERM GOAL #4   Title Pt will be mod I with simple snack/cold meal prep   Status Achieved     OT LONG TERM GOAL #5    Title Pt and dtr will be mod I with upgraded home activities program - 04/19/2017   Status On-going     OT LONG TERM GOAL #6   Title Pt will be  mod I in putting dishes away in cabinets   Status On-going     OT LONG TERM GOAL #7   Title Pt will demonstrate ability to pick up light weight object from floor with no more than supervision   Status On-going     OT LONG TERM GOAL #8   Title Pt will demonstrate ability to push grocery cart with both UE's in store while assisting dtr in simple shopping task.    Status On-going     OT LONG TERM GOAL  #9   Baseline Pt will demonstrate ability to carry at least 2 pound object in R hand while ambulating.   Status On-going               Plan - 2017/03/10 1244    Clinical Impression Statement Pt progressing toward goals. Pt is very open to using RW temporarily in order to improve postural control.    Rehab Potential Fair   Current Impairments/barriers affecting progress: length of time since CVA, learned habits   OT Frequency 2x / week   OT Duration 8 weeks   OT Treatment/Interventions Self-care/ADL training;Neuromuscular education;Therapeutic exercise;Passive range of motion;Manual Therapy;Functional Mobility Training;Splinting;Therapeutic exercises;Therapeutic activities;Balance training;Patient/family education   Plan NMR for trunk, RUE, functional use of RUE, functional ambulation, transitions.    Consulted and Agree with Plan of Care Patient;Family member/caregiver   Family Member Consulted dtr- [name]      Patient will benefit from skilled therapeutic intervention in order to improve the following deficits and impairments:  Abnormal gait, Decreased activity tolerance, Decreased balance, Decreased cognition, Decreased range of motion, Decreased mobility, Decreased strength, Difficulty walking, Impaired UE functional use, Impaired tone, Impaired perceived functional ability  Visit Diagnosis: Spastic hemiplegia of right nondominant side  as late effect of cerebral infarction (HCC)  Abnormal posture  Unsteadiness on feet      G-Codes - 2017/03/10 1248    Functional Assessment Tool Used (Outpatient only) skilled clinical observation   Functional Limitation Carrying, moving and handling objects   Carrying, Moving and Handling Objects Current Status (Z6109) At least  60 percent but less than 80 percent impaired, limited or restricted   Carrying, Moving and Handling Objects Goal Status (Z6109(G8985) At least 40 percent but less than 60 percent impaired, limited or restricted      Problem List Patient Active Problem List   Diagnosis Date Noted  . Spastic hemiplegia affecting dominant side (HCC) 02/07/2017  . 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 Right foot drop 12/02/2016  . 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 Depression due to dementia 01/28/2015  . 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 Hemiplegia following CVA (cerebrovascular accident) (HCC) 05/31/2014  . 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 Left pontine stroke (HCC) 05/31/2014  . MCI (mild cognitive impairment) with memory loss 05/31/2014    Norton Pastelulaski, Neriyah Cercone Halliday, OTR/L 02/28/2017, 12:48 PM  [facility] [address] [place], [place], [address] Phone: [phone]   Fax:  [phone]  Name: Angie FavaDonnie Mailloux MRN: [id number] Date of Birth: 1940/10/21

## 2017-03-01 ENCOUNTER — Ambulatory Visit: Payer: Medicare Other | Admitting: Occupational Therapy

## 2017-03-02 ENCOUNTER — Encounter: Payer: 59 | Admitting: Occupational Therapy

## 2017-03-07 ENCOUNTER — Ambulatory Visit (INDEPENDENT_AMBULATORY_CARE_PROVIDER_SITE_OTHER): Payer: Medicare Other | Admitting: Neurology

## 2017-03-07 ENCOUNTER — Encounter: Payer: Self-pay | Admitting: Neurology

## 2017-03-07 VITALS — BP 138/90 | HR 82 | Ht 70.0 in | Wt 158.0 lb

## 2017-03-07 DIAGNOSIS — I6350 Cerebral infarction due to unspecified occlusion or stenosis of unspecified cerebral artery: Secondary | ICD-10-CM

## 2017-03-07 DIAGNOSIS — I639 Cerebral infarction, unspecified: Secondary | ICD-10-CM

## 2017-03-07 DIAGNOSIS — I69359 Hemiplegia and hemiparesis following cerebral infarction affecting unspecified side: Secondary | ICD-10-CM

## 2017-03-07 NOTE — Progress Notes (Signed)
Provider:  Melvyn Graham, M D  Referring Provider: [name], MD Primary Care Physician:  Dennis Housekeeper, MD  Chief Complaint  Patient presents with  . Follow-up    memory ok according to patient   Interval history from 03/07/2017, [name] and his daughter [name] are seen here today. The patient is no longer using a 3 pronged cane but uses a walker which allows him to walk straighter with better body position and symmetry and keeps him from falling. His mobility also has improved with  AFO- and he responded well to a nerve block Dr [name] will also evaluate the shoulder for possible [employer] needs. He is not nearly as tense since using the walker and replacing the cane.  PT and OT continue .  In comparison to previous visits his blood pressure is improved, his weight has improved, his ability to walk and exercise tolerance has greatly improved. He is alert and no longer as sleepy. I will follow Dr [name] observation.     HPI: Interval history from 2016/12/25. Mrs. [name] died in early 03-04-2018and [name] has now moved in with his daughter [name]. She reports that he is doing well with the changes in environment and he does look well groomed and well fed and alert. We performed today Mini-Mental Status Examination in which she scored 26 out of 30 points this has to be seen in context with his right hand dominant hemiparesis, he is still able to draw also with a tremor, he was able to draw clock face and right a sentence. He generated 7 [name]. I would therefore adjust his test to 27 out of 30 points. [name] has noticed that her father snores horribly, and he reports feeling sleepy all the time. 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 I will order a sleep test for him. His right foot needs a new AFT, and he developed some calluses. I will send him to a PT evaluation and take their recommendations  to [facility].    [name] is a 76 y.o. male  Is seen here after a prolonged time, I used to follow this stroke patient during his hospitalization 10 years ago. His visit today was arranged by his wife, his PCP is Dr. Donette Graham, [name] was referred for a memory evaluation .Because of his history of a complicated CVA ,brain stem stroke syndromes and hemiparesis , a vascular effect has to be taken into account. [name] father had late onset dementia and personality changes and Mrs. [name] sees some parallels in his behavior. He has become more aggressive, easier agitated, which was not part of his personality before this year. The couple celebreated their [name] Anniversary in 2014,  active in the [employer] in [place] . [name] performs household chores and the couple spends time with the grandchildren now 15 and 31.  He has become impulsive, and sometimes" downright mean ". He still uses a urinary cath. Has right hand weakness , leg stiffness, left sensory loss, right facial weakness -hold cane in the left hand. Dysphonia, aphasia.  [name] is falling more often, his skin is scabbed. He fell rising while pulling up on his cane. His walker has a seat and is used outside.  His other cane will have 3 prongs. He sleeps a lot. His wife started peritoneal dialysis.  2 times a  day. Needs to wear a diaper for bowel accident. Is very happy with Dr. Abel Graham.  She has a sleep apnea evaluation tonight. [name] snores terribly, even before his brainstem stroke, dependent on supine position.   I have  the pleasure today on 06/02/2016 to see [name] again, today in the presence of his daughter [name]. [name] had become extremely daytime sleepy over the last couple of months while his wife's health has declined to the level where she could not safely ambulate for longer distance, and she is probably no longer safe to drive. She failed peritoneal dialysis, became extremely  dehydrated and was insufficiently dialyzed. She has not a switch to hemodialysis but still is not thriving. For this reason the novel take over the organization of home health care which she initiated already. And also the healthcare power of attorney.She has requested FMLA. In the meantime home health care has been of significant benefit for him. He is eating more regular meals, he is leaving the bed at a certain time each morning. He does not need help with dressing. He is walking with a cane. He is alert oriented and able to answer my questions. He is getting daily physical therapy by home health. He has improved his right hemi-spasticity, he is using her single-prong cane. He could raise today from a seated position without assistance of another person.   Review of Systems: Out of a complete 14 system review, the patient complains of only the following symptoms, and all other reviewed systems are negative. He has never been a reader, watches TV , sleeps a lot.  Aphasia, dysarthria, spastic hemiparesis. Right dominant side.    Social History   Social History  . Marital status: Widowed    Spouse name: [name]  . Number of children: 2  . Years of education: 12   Occupational History  . Not on file.   Social History Main Topics  . Smoking status: Never Smoker  . Smokeless tobacco: Never Used  . Alcohol use No  . Drug use: No  . Sexual activity: Not on file   Other Topics Concern  . Not on file   Social History Narrative   Patient is married [name]) and lives at home with his wife.   Patient is disabled.    Patient has two adult children and two grandchildren.   Patient has a high school education.   Patient is left-handed.   Patient drinks one cup of coffee daily.             Family History  Problem Relation Age of Onset  . Colon cancer Brother     Past Medical History:  Diagnosis Date  . CKD (chronic kidney disease), stage II   . CVA (cerebral infarction)    Left  thalamic, right-sided weakness wih aphagia, right leg brace  . Dyslipidemia   . Elevated PSA    biopsy negative in 2008  . GERD (gastroesophageal reflux disease)   . Hypertension    without medication since 2006  . Major depression   . Seasonal rhinitis     Past Surgical History:  Procedure Laterality Date  . NASAL SEPTUM SURGERY    . VASECTOMY      Current Outpatient Prescriptions  Medication Sig Dispense Refill  . amLODipine (NORVASC) 2.5 MG tablet Take 2.5 mg by mouth daily.    Marland Kitchen aspirin 81 MG tablet Take 81 mg by mouth daily.    Marland Kitchen buPROPion (WELLBUTRIN XL) 150 MG 24 hr  tablet TAKE 1 TABLET(150 MG) BY MOUTH DAILY 30 tablet 5  . Cholecalciferol (VITAMIN D3) 2000 units TABS Take 2,000 Units by mouth.    Tilman Neat. CRANBERRY EXTRACT PO Take 450 mg by mouth daily.    . DULoxetine (CYMBALTA) 60 MG capsule Take 60 mg by mouth daily.    . Glucosamine-Chondroit-Vit C-Mn (GLUCOSAMINE CHONDR 1500 COMPLX) CAPS Take 1 capsule by mouth daily.    . simvastatin (ZOCOR) 40 MG tablet Take 40 mg by mouth daily.     No current facility-administered medications for this visit.     Allergies as of 03/07/2017  . (No Known Allergies)    Vitals: BP 138/90   Pulse 82   Ht 5\' 10"  (1.778 m)   Wt 158 lb (71.7 kg)   BMI 22.67 kg/m  Last Weight:  Wt Readings from Last 1 Encounters:  03/07/17 158 lb (71.7 kg)   Last Height:   Ht Readings from Last 1 Encounters:  03/07/17 5\' 10"  (1.778 m)   Montreal Cognitive Assessment  05/31/2014  Visuospatial/ Executive (0/5) 3  Naming (0/3) 3  Attention: Read list of digits (0/2) 2  Attention: Read list of letters (0/1) 1  Attention: Serial 7 subtraction starting at 100 (0/3) 3  Language: Repeat phrase (0/2) 2  Language : Fluency (0/1) 1  Abstraction (0/2) 2  Delayed Recall (0/5) 0  Orientation (0/6) 4  Total 21   MMSE - Mini Mental State Exam 03/07/2017 12/02/2016 06/02/2016  Orientation to time 3 3 4   Orientation to Place 5 5 5   Registration 3 3 3     Attention/ Calculation 3 4 1   Recall 3 3 2   Language- name 2 objects 2 2 2   Language- repeat 1 1 1   Language- follow 3 step command 3 3 3   Language- read & follow direction 1 1 1   Write a sentence 1 1 1   Copy design 1 0 1  Total score 26 26 24     Physical exam:  General: The patient is awake, alert and appears not in acute distress. The patient is well groomed. Head: Normocephalic, atraumatic. Neck is supple. Mallampati 2 neck circumference: 14.5  Cardiovascular:  Regular rate and rhythm , without  murmurs or carotid bruit, and without distended neck veins. Respiratory: Lungs are clear to auscultation. Skin:  Without evidence of edema, or rash.  Trunk: Neurologic exam : The patient is awake and alert, oriented to place and time.   [name] performed a more Mini-Mental Status Examination today he stopped the first 2 of the serial sevens. Accessed  again excluding the handwriting since [name]  is impaired through his brainstem stroke, as is drawing , but he was able to place the hands into the clock face correctly.  There is a normal attention span & concentration ability. Speech dysarthria, dysphonia- Mood and affect are appropriate.  Cranial nerves: Pupils are unequal but briskly reactive to light. He has lost the right peripheral vision, stereo vision is impaired -  He cannot drive . Has no diplopia. The patient is able to direct his gaze left and right but not up or down. 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 Hearing to finger rub intact. Facial sensation intact to fine touch. Facial motor strength is symmetric and tongue and uvula move midline. Tongue protrusion into either cheek is normal! Shoulder shrug is normal.  Gait and station: Patient walks today with walker while he wears his AFT which he has had since a stroke  14 years ago.  Deep tendon reflexes: clonic-  brisk in the right .Babinski maneuver response is upgoing right . Assessment:  30 minute vist  with more than 50% of the face to face time dedicated to discussion of physical abilities, stable cognition,  Improved over last visit Fall prevention. Needs for daughter to  take health care over. 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   Hypersomnia - resolved - he is physicially active. His gait is much improved the stability is improved by using a walker, he responded well to the nerve block. He has undergone physical therapy and occupational therapy and is still continued to go. His ankles and his musculature of the feet have improved. His posture has improved.   Cognitive impairment not progressed by history and observation. 26-30 again - will not repeat in 6 months but perhaps every 12 month. .  Short term memory loss. memory impairment, constitutes dementia. He cannot be measured on MOCA due to vision and handwriting impairment. Mini-Mental Status Examination revealed today 26 out of 30 points which is higher than the 22-30 3 month ago.    Depression is improved. Less agitated and less angry on Wellbutrin. More energy. The geriatric depression score was endorsed at 2 , down from 7 points which was very indicative of depression.    [place] of his pontine/ brain stem stroke from 2005 - including right hemiparesis, right foot drop.   Plan:   Memory improved , perhaps due to a more stimulating environment.  Depression much improved , He takes an antidepressive and I like for him to have something that is stimulating as well; as an antidepressivant,  Wellbutrin was chosen.  He seems to blossom.  Has now a walker alowing more symmetric gait and responded well to a nerve block, Dr [name].   He  lives with his daughter, [name], who has the FMLA.  Vit B complex now taken.   Dennis Mylar  Shalynn Jorstad MD   Cc Dr [name], [place]  03/07/2017

## 2017-03-07 NOTE — Patient Instructions (Signed)
Continue with PT and OT, I am very happy with the outcome of your rehab, and use of a walker.  Dr Doroteo BradfordKirstein will keep me posted. CD

## 2017-03-08 ENCOUNTER — Ambulatory Visit: Payer: Medicare Other | Attending: Neurology | Admitting: Occupational Therapy

## 2017-03-08 ENCOUNTER — Encounter: Payer: Self-pay | Admitting: Occupational Therapy

## 2017-03-08 DIAGNOSIS — R293 Abnormal posture: Secondary | ICD-10-CM | POA: Insufficient documentation

## 2017-03-08 DIAGNOSIS — R2689 Other abnormalities of gait and mobility: Secondary | ICD-10-CM | POA: Diagnosis present

## 2017-03-08 DIAGNOSIS — R2681 Unsteadiness on feet: Secondary | ICD-10-CM | POA: Diagnosis present

## 2017-03-08 DIAGNOSIS — I69353 Hemiplegia and hemiparesis following cerebral infarction affecting right non-dominant side: Secondary | ICD-10-CM | POA: Insufficient documentation

## 2017-03-08 NOTE — Therapy (Signed)
[facility] [address] [place], [place], [address] Phone: [phone]   Fax:  [phone]  Occupational Therapy Treatment  Patient Details  Name: Dennis FavaDonnie Graham MRN: [id number] Date of Birth: [birth year] Referring Provider: Dr. Porfirio Mylararmen Dohmeier  Encounter Date: 03/08/2017      OT End of Session - 03/08/17 1653    Visit Number 11   Number of Visits 24   Date for OT Re-Evaluation 04/19/17   Authorization Type medicare will need G code and PN every 10th visit   Authorization Time Period 60 days   Authorization - Visit Number 11   Authorization - Number of Visits 20   OT Start Time 1447   OT Stop Time 1531   OT Time Calculation (min) 44 min   Activity Tolerance Patient tolerated treatment well      Past Medical History:  Diagnosis Date  . CKD (chronic kidney disease), stage II   . CVA (cerebral infarction)    Left thalamic, right-sided weakness wih aphagia, right leg brace  . Dyslipidemia   . Elevated PSA    biopsy negative in 2008  . GERD (gastroesophageal reflux disease)   . Hypertension    without medication since 2006  . Major depression   . Seasonal rhinitis     Past Surgical History:  Procedure Laterality Date  . NASAL SEPTUM SURGERY    . VASECTOMY      There were no vitals filed for this visit.      Subjective Assessment - 03/08/17 1456    Subjective  I need to pay attention to my right foot when I walk   Patient is accompained by: Family member  dtr   Pertinent History Pt with CVA 13 years ago; pt's wife passed away earlier this year and pt underwent decline with increase in falls. PT recommeneded OT eval.    Patient Stated Goals I don't know- I am not sure what OT can do for me   Currently in Pain? No/denies                      OT Treatments/Exercises (OP) - 03/08/17 0001      Neurological Re-education Exercises   Other Exercises 1 Neuro re ed to address sit to stand,  stand to sit from lower surfaces - pt demonstrating improved carry over.  Also addressed increasing activation of RUE and RLE in standing with forced paradaigm and weight shifting to R, as well as shifting to R and stepping with LLE first with RUE support then with no UE support.  Addressed trunk control, RLE control with standing, stepping and functional ambulation in order to decrease RUE posturing and tone to allow for more functional use of RUE.  Pt demonstrating better use of and less posturing of RUE in standing and walking.  Pt able to tolerate 43 minutes of activity with only 2 short rest breaks.                   OT Short Term Goals - 03/08/17 1652      OT SHORT TERM GOAL #1   Title Pt and dtr will be mod I with upgraded home activities program - 03/22/2017   Status On-going     OT SHORT TERM GOAL #2   Title Pt will demonstrate ability to reach into mid level shelf for light weight object with LUE in good postural alignment   Status On-going     OT SHORT  TERM GOAL #3   Title Pt will demonstrate ability to tolerated approximately 25 minutes of ambulatory activity without rest breaks to decrease fall risk   Status On-going     OT SHORT TERM GOAL #4   Title Pt will demonstrate ability to complete simple bilateral activities in standing exhibiting good postural alignment   Status On-going           OT Long Term Goals - 03/08/17 1652      OT LONG TERM GOAL #1   Title Pt and family will be  mod I with home activities program - 02/24/2017   Status Achieved     OT LONG TERM GOAL #2   Title Pt will demonstrate ability to use RUE as non dominant in simple ADL tasks   Status Achieved     OT LONG TERM GOAL #3   Title Pt will demonstrate ability to carry light weight object in R hand while ambulating   Status Achieved     OT LONG TERM GOAL #4   Title Pt will be mod I with simple snack/cold meal prep   Status Achieved     OT LONG TERM GOAL #5   Title Pt and dtr will  be mod I with upgraded home activities program - 04/19/2017   Status On-going     OT LONG TERM GOAL #6   Title Pt will be  mod I in putting dishes away in cabinets   Status On-going     OT LONG TERM GOAL #7   Title Pt will demonstrate ability to pick up light weight object from floor with no more than supervision   Status On-going     OT LONG TERM GOAL #8   Title Pt will demonstrate ability to push grocery cart with both UE's in store while assisting dtr in simple shopping task.    Status On-going     OT LONG TERM GOAL  #9   Baseline Pt will demonstrate ability to carry at least 2 pound object in R hand while ambulating.   Status On-going               Plan - 03/08/17 1652    Clinical Impression Statement Pt continues to progress toward goals. Pt with improving carry over from session to session.   Rehab Potential Fair   Current Impairments/barriers affecting progress: length of time since CVA, learned habits   OT Frequency 2x / week   OT Duration 8 weeks   OT Treatment/Interventions Self-care/ADL training;Neuromuscular education;Therapeutic exercise;Passive range of motion;Manual Therapy;Functional Mobility Training;Splinting;Therapeutic exercises;Therapeutic activities;Balance training;Patient/family education   Plan NMR for trunk, RUE, functional use of RUE, functional ambulation, transitions, balance. postural alignment and control   Consulted and Agree with Plan of Care Patient;Family member/caregiver   Family Member Consulted dtr- [name]      Patient will benefit from skilled therapeutic intervention in order to improve the following deficits and impairments:  Abnormal gait, Decreased activity tolerance, Decreased balance, Decreased cognition, Decreased range of motion, Decreased mobility, Decreased strength, Difficulty walking, Impaired UE functional use, Impaired tone, Impaired perceived functional ability  Visit Diagnosis: Spastic hemiplegia of right nondominant side  as late effect of cerebral infarction (HCC)  Abnormal posture  Unsteadiness on feet    Problem List Patient Active Problem List   Diagnosis Date Noted  . Hemiplegia affecting dominant side, post-stroke (HCC) 03/07/2017  . Spastic hemiplegia affecting dominant side (HCC) 02/07/2017  . Hemiparesis affecting right side as late effect of cerebrovascular accident (  HCC) 12/02/2016  . Right foot drop 12/02/2016  . 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 Depression due to dementia 01/28/2015  . 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 Hemiplegia following CVA (cerebrovascular accident) (HCC) 05/31/2014  . 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 Left pontine stroke (HCC) 05/31/2014  . MCI (mild cognitive impairment) with memory loss 05/31/2014    Norton Pastel, OTR/L 03/08/2017, 4:54 PM  [facility] [address] [place], [place], [address] Phone: [phone]   Fax:  [phone]  Name: [name] MRN: [id number] Date of Birth: 1940-11-15

## 2017-03-21 ENCOUNTER — Ambulatory Visit: Payer: 59 | Admitting: Physical Medicine & Rehabilitation

## 2017-03-22 ENCOUNTER — Encounter: Payer: Self-pay | Admitting: Occupational Therapy

## 2017-03-22 ENCOUNTER — Ambulatory Visit: Payer: Medicare Other | Admitting: Occupational Therapy

## 2017-03-22 ENCOUNTER — Ambulatory Visit: Payer: Medicare Other | Admitting: Physical Therapy

## 2017-03-22 DIAGNOSIS — R2681 Unsteadiness on feet: Secondary | ICD-10-CM

## 2017-03-22 DIAGNOSIS — I69353 Hemiplegia and hemiparesis following cerebral infarction affecting right non-dominant side: Secondary | ICD-10-CM | POA: Diagnosis not present

## 2017-03-22 DIAGNOSIS — R293 Abnormal posture: Secondary | ICD-10-CM

## 2017-03-22 DIAGNOSIS — R2689 Other abnormalities of gait and mobility: Secondary | ICD-10-CM

## 2017-03-22 NOTE — Therapy (Signed)
[name] [address] [place], [place], [address] Phone: [phone]   Fax:  [phone]  Occupational Therapy Treatment  Patient Details  Name: [name] MRN: [id number] Date of Birth: 23-May-1941 Referring Provider: Dr. Porfirio Mylar Graham  Encounter Date: 03/22/2017      OT End of Session - 03/22/17 1242    Visit Number 12   Number of Visits 24   Date for OT Re-Evaluation 04/19/17   Authorization Type medicare will need G code and PN every 10th visit   Authorization Time Period 60 days   Authorization - Visit Number 12   Authorization - Number of Visits 20   OT Start Time 0931   OT Stop Time 1014   OT Time Calculation (min) 43 min   Activity Tolerance Patient tolerated treatment well      Past Medical History:  Diagnosis Date  . CKD (chronic kidney disease), stage II   . CVA (cerebral infarction)    Left thalamic, right-sided weakness wih aphagia, right leg brace  . Dyslipidemia   . Elevated PSA    biopsy negative in 2008  . GERD (gastroesophageal reflux disease)   . Hypertension    without medication since 2006  . Major depression   . Seasonal rhinitis     Past Surgical History:  Procedure Laterality Date  . NASAL SEPTUM SURGERY    . VASECTOMY      There were no vitals filed for this visit.      Subjective Assessment - 03/22/17 0935    Patient is accompained by: Family member  dtr   Pertinent History Pt with CVA 13 years ago; pt's wife passed away earlier this year and pt underwent decline with increase in falls. PT recommeneded OT eval.    Patient Stated Goals I don't know- I am not sure what OT can do for me   Currently in Pain? No/denies                      OT Treatments/Exercises (OP) - 03/22/17 0001      ADLs   ADL Comments Addressed STG's- see goals for updates.  Pt's dtr reported that pt was with another family member this past week as dtr was out of town and pt  was not very active therefore will asses actiivity tolerance at a later session.       Neurological Re-education Exercises   Other Exercises 1 Neuro re ed to address functional ambulation with RW with emphasis on postural alignment and control, as well as to address functional tasks requiring unilateral mid to high reach with RUE as well as bilateral reach in standing.  Pt to start unloading dishwasher at home with dtr supervising.                    OT Short Term Goals - 03/22/17 1237      OT SHORT TERM GOAL #1   Title Pt and dtr will be mod I with upgraded home activities program - 03/22/2017   Status Achieved     OT SHORT TERM GOAL #2   Title Pt will demonstrate ability to reach into mid level shelf for light weight object with LUE in good postural alignment   Status Achieved     OT SHORT TERM GOAL #3   Title Pt will demonstrate ability to tolerated approximately 25 minutes of ambulatory activity without rest breaks to decrease fall risk   Status On-going  OT SHORT TERM GOAL #4   Title Pt will demonstrate ability to complete simple bilateral activities in standing exhibiting good postural alignment   Status Achieved           OT Long Term Goals - 03/22/17 1238      OT LONG TERM GOAL #1   Title Pt and family will be  mod I with home activities program - 02/24/2017   Status Achieved     OT LONG TERM GOAL #2   Title Pt will demonstrate ability to use RUE as non dominant in simple ADL tasks   Status Achieved     OT LONG TERM GOAL #3   Title Pt will demonstrate ability to carry light weight object in R hand while ambulating   Status Achieved     OT LONG TERM GOAL #4   Title Pt will be mod I with simple snack/cold meal prep   Status Achieved     OT LONG TERM GOAL #5   Title Pt and dtr will be mod I with upgraded home activities program - 04/19/2017   Status On-going     OT LONG TERM GOAL #6   Title Pt will be  mod I in putting dishes away in cabinets    Status On-going     OT LONG TERM GOAL #7   Title Pt will demonstrate ability to pick up light weight object from floor with no more than supervision   Status On-going     OT LONG TERM GOAL #8   Title Pt will demonstrate ability to push grocery cart with both UE's in store while assisting dtr in simple shopping task.    Status On-going     OT LONG TERM GOAL  #9   Baseline Pt will demonstrate ability to carry at least 2 pound object in R hand while ambulating.   Status On-going               Plan - 03/22/17 1239    Clinical Impression Statement Pt progressing toward goals.  Pt with improving postural control and alignment leading to increased functional use of RUE in standing.    Rehab Potential Fair   Current Impairments/barriers affecting progress: length of time since CVA, learned habits   OT Frequency 2x / week   OT Duration 8 weeks   OT Treatment/Interventions Self-care/ADL training;Neuromuscular education;Therapeutic exercise;Passive range of motion;Manual Therapy;Functional Mobility Training;Splinting;Therapeutic exercises;Therapeutic activities;Balance training;Patient/family education   Plan NMR for trunk, RUE functional use, functional ambulation, transitions, balance, postural alignment and control.    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 Hemiplegia affecting dominant side, post-stroke (HCC) 03/07/2017  . Spastic hemiplegia affecting dominant side (HCC) 02/07/2017  . 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 Right foot drop 12/02/2016  . 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 Depression due to dementia 01/28/2015  . Dissection of vertebral  artery (HCC) 01/28/2015  . 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 Hemiplegia following CVA (cerebrovascular accident) (HCC) 05/31/2014  . 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 Left pontine stroke (HCC) 05/31/2014  . MCI (mild cognitive impairment) with memory loss 05/31/2014    Norton Pastel, OTR/L 03/22/2017, 12:43 PM  [facility] [address] [place], [place], [address] Phone: [phone]   Fax:  [phone]  Name: [name] MRN: [id number] Date of Birth: 10-Nov-1940

## 2017-03-23 NOTE — Therapy (Signed)
[facility] [address] [place], [place], [address] Phone: [phone]   Fax:  [phone]  Physical Therapy Treatment  Patient Details  Name: [name] MRN: [id number] Date of Birth: [birth year] Referring Provider: Dr. Brett Fairy  Encounter Date: 03/22/2017      PT End of Session - 03/23/17 2210    Visit Number 10  G10/renewal completed   Number of Visits 26   Date for PT Re-Evaluation 05/21/17   Authorization Type [employer] Medicare: G-CODE AND PROGRESS NOTE EVERY 10 VISITS.    PT Start Time 1017   PT Stop Time 1101   PT Time Calculation (min) 44 min      Past Medical History:  Diagnosis Date  . CKD (chronic kidney disease), stage II   . CVA (cerebral infarction)    Left thalamic, right-sided weakness wih aphagia, right leg brace  . Dyslipidemia   . Elevated PSA    biopsy negative in 2008  . GERD (gastroesophageal reflux disease)   . Hypertension    without medication since 2006  . Major depression   . Seasonal rhinitis     Past Surgical History:  Procedure Laterality Date  . NASAL SEPTUM SURGERY    . VASECTOMY      There were no vitals filed for this visit.      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 No/denies                         [name] Adult PT Treatment/Exercise - 03/23/17 0001      Ambulation/Gait   Ambulation/Gait Yes   Ambulation/Gait Assistance 5: Supervision;4: Min guard    Ambulation/Gait Assistance Details cues to decrease speed and externally rotate Rt foot during swing and stance    Ambulation Distance (Feet) 230 Feet   Assistive device Rolling walker   Gait Pattern Step-through pattern;Decreased stride length;Decreased arm swing - right;Decreased dorsiflexion - right;Decreased hip/knee flexion - right;Right hip hike;Decreased trunk rotation   Ambulation Surface Level;Indoor     Knee/Hip Exercises: Stretches   Passive Hamstring Stretch Right;2 reps;30 seconds     Knee/Hip Exercises: Seated   Long Arc Quad AROM;Right;1 set;10 reps  with cues to sit straight     Knee/Hip Exercises: Prone   Hamstring Curl 1 set;10 reps     Ankle Exercises: Seated   Other Seated Ankle Exercises AAROM for Rt ankle inversion and eversion 10 reps       TUG score 32.5 secs with RW  Gait velocity .96 ft/sec with RW            PT Short Term Goals - 03/23/17 2220      PT SHORT TERM GOAL #3   Title Pt will improve gait speed to >/= 1,4 ft/sec. with RW to safely amb. in the community.  04-21-17   Baseline gait velocity 34.5 secs = .95 ft/sec with RW on 03-22-17   Time 4   Period  Weeks   Status New     PT SHORT TERM GOAL #4   Title Obtain consult for new AFO for RLE after Botox injection scheduled in early August.  04-21-17   Time 4   Period Weeks   Status New     PT SHORT TERM GOAL #5   Title Improve TUG score to </= 24 secs with RW for improved mobility.  04-21-17   Baseline 32.86 secs with RW   Time 4   Period Weeks   Status New     Additional Short Term Goals   Additional Short Term Goals Yes     PT SHORT TERM GOAL #6   Title Independent in updated HEP including  aquatic exercises.  04-21-17   Time 4   Period Weeks   Status New           PT Long Term Goals - 03/23/17 2213      PT LONG TERM GOAL #1   Title Pt will verbalize understanding of risk factors/signs/symptoms of CVA to reduce risk of add'l CVA. TARGET DATE FOR ALL LTGS: 02/22/17    Status Achieved     PT LONG TERM GOAL #2   Title Pt will amb. 500' with RW with SBA on flat, even surface with min cues to increase RLE external rotation.  05-21-17   Time 8   Period Weeks   Status New     PT LONG TERM GOAL #3   Title Pt will report zero falls in last 4 weeks to improve safety.   Baseline daughter reports pt fell at her brother's house last week   Status Not Met     PT LONG TERM GOAL #4   Title Pt will improve BERG score to >/=44/56 to decr. falls risk.  05-21-17   Time 8   Period Weeks   Status On-going     PT LONG TERM GOAL #5   Title Obtain new AFO for RLE to minimize gait deviations.  05-21-17   Time 8   Period Weeks   Status New               Plan - 03/23/17 2235    Clinical Impression Statement Pt's gait pattern has signficantly improved with use of RW rather than cane - gait deviiations are reduced and posture is improved with less tone/spasticity; LTG's not achieved - will continue to work toward goals    Rehab Potential Good   Clinical Impairments Affecting Rehab Potential see PMH, and CVA 13 years ago; severity of tone   PT Frequency 2x / week   PT Duration 8 weeks   PT Treatment/Interventions ADLs/Self Care Home Management;Biofeedback;Canalith Repostioning;Electrical Stimulation;Neuromuscular re-education;Balance training;Therapeutic exercise;Therapeutic activities;Manual techniques;Functional mobility training;Stair training;Orthotic Fit/Training;Gait training;DME Instruction;Patient/family education;Vestibular;Aquatic Therapy   PT Next Visit Plan continue gait with RW and balance training:  core stabilization-    Consulted and Agree with Plan of Care Patient;Family member/caregiver   Family Member Consulted [name]      Patient will benefit from skilled therapeutic intervention in order to improve the following deficits and impairments:  Decreased endurance, Abnormal gait, Decreased knowledge of use of DME, Decreased balance, Decreased  mobility, Dizziness, Impaired flexibility, Postural dysfunction, Decreased coordination, Decreased strength, Impaired tone  Visit Diagnosis: 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 ([place]) - Plan: PT plan of care cert/re-cert  Other abnormalities of gait and mobility - Plan: PT plan of care cert/re-cert  Unsteadiness on feet - Plan: PT  plan of care cert/re-cert       G-Codes - April 09, 2017 11/04/2226    Functional Assessment Tool Used (Outpatient Only) TUG score 32.5     gait velocity .96 ft/sec with RW   Functional Limitation Mobility: Walking and moving around   Mobility: Walking and Moving Around Current Status [phone]) At least 60 percent but less than 80 percent impaired, limited or restricted   Mobility: Walking and Moving Around Goal Status [phone]) At least 40 percent but less than 60 percent impaired, limited or restricted      Problem List Patient Active Problem List   Diagnosis Date Noted  . Hemiplegia affecting dominant side, post-stroke ([place]) 03/07/2017  . Spastic hemiplegia affecting dominant side ([place]) 02/07/2017  . Hemiparesis affecting right side as late effect of cerebrovascular accident ([place]) 12/02/2016  . Right foot drop 12/02/2016  . 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 Depression due to dementia 01/28/2015  . Dissection of vertebral artery ([place]) 01/28/2015  . 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 Hemiplegia following CVA (cerebrovascular accident) ([place]) 05/31/2014  . 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 Left pontine stroke ([place]) 05/31/2014  . MCI (mild cognitive impairment) with memory loss 05/31/2014    Tyquasia Pant, [place], PT 03/23/2017, 10:40 PM  [place] [address] [place], [place], [address] Phone: [phone]   Fax:  [phone]  Name: [name] MRN:  [id number] Date of Birth: [birth year]

## 2017-03-24 ENCOUNTER — Ambulatory Visit: Payer: Medicare Other | Admitting: Occupational Therapy

## 2017-03-24 ENCOUNTER — Encounter: Payer: Self-pay | Admitting: Occupational Therapy

## 2017-03-24 DIAGNOSIS — R2681 Unsteadiness on feet: Secondary | ICD-10-CM

## 2017-03-24 DIAGNOSIS — I69353 Hemiplegia and hemiparesis following cerebral infarction affecting right non-dominant side: Secondary | ICD-10-CM

## 2017-03-24 DIAGNOSIS — R293 Abnormal posture: Secondary | ICD-10-CM

## 2017-03-24 NOTE — Therapy (Signed)
[facility] [address] [place], [place], [address] Phone: [phone]   Fax:  [phone]  Occupational Therapy Treatment  Patient Details  Name: [name] MRN: [id number] Date of Birth: 1941-01-12 Referring Provider: Dr. Asencion Partridge Dohmeier  Encounter Date: 03/24/2017      OT End of Session - 03/24/17 1243    Visit Number 13   Number of Visits 24   Date for OT Re-Evaluation 04/19/17   Authorization Type medicare will need G code and PN every 10th visit   Authorization Time Period 60 days   Authorization - Visit Number 13   Authorization - Number of Visits 20   OT Start Time 4496   OT Stop Time 1146   OT Time Calculation (min) 44 min      Past Medical History:  Diagnosis Date  . CKD (chronic kidney disease), stage II   . CVA (cerebral infarction)    Left thalamic, right-sided weakness wih aphagia, right leg brace  . Dyslipidemia   . Elevated PSA    biopsy negative in 2008  . GERD (gastroesophageal reflux disease)   . Hypertension    without medication since 2006  . Major depression   . Seasonal rhinitis     Past Surgical History:  Procedure Laterality Date  . NASAL SEPTUM SURGERY    . VASECTOMY      There were no vitals filed for this visit.      Subjective Assessment - 03/24/17 1111    Subjective  I am a litte afraid to put my weight on my R side.    Patient is accompained by: Family member  dtr and granddtr   Pertinent History Pt with CVA 13 years ago; pt's wife passed away earlier this year and pt underwent decline with increase in falls. PT recommeneded OT eval.    Patient Stated Goals I don't know- I am not sure what OT can do for me   Currently in Pain? No/denies                      OT Treatments/Exercises (OP) - 03/24/17 0001      Neurological Re-education Exercises   Other Exercises 1 Addresesd trunk control to facilitate lower trunk intiated movement on ball for A/P  and latera weight shifts with emphasis on active elongation on R side.  Transitioned to kneeling and addressed upper trunk initiated movement for lateral weight shift with arms in extension and relaxing head and shoulders to allow trunk to do the work.  Transitioned into standing and addressed functional ambulation with only light UE support in order to eventually progress to improve UE use during standing and functional ambulation.                    OT Short Term Goals - 03/24/17 1240      OT SHORT TERM GOAL #1   Title Pt and dtr will be mod I with upgraded home activities program - 03/22/2017   Status Achieved     OT SHORT TERM GOAL #2   Title Pt will demonstrate ability to reach into mid level shelf for light weight object with LUE in good postural alignment   Status Achieved     OT SHORT TERM GOAL #3   Title Pt will demonstrate ability to tolerated approximately 25 minutes of ambulatory activity without rest breaks to decrease fall risk   Status Achieved     OT SHORT TERM GOAL #  4   Title Pt will demonstrate ability to complete simple bilateral activities in standing exhibiting good postural alignment   Status Achieved           OT Long Term Goals - 03/24/17 1241      OT LONG TERM GOAL #1   Title Pt and family will be  mod I with home activities program - 02/24/2017   Status Achieved     OT LONG TERM GOAL #2   Title Pt will demonstrate ability to use RUE as non dominant in simple ADL tasks   Status Achieved     OT LONG TERM GOAL #3   Title Pt will demonstrate ability to carry light weight object in R hand while ambulating   Status Achieved     OT LONG TERM GOAL #4   Title Pt will be mod I with simple snack/cold meal prep   Status Achieved     OT LONG TERM GOAL #5   Title Pt and dtr will be mod I with upgraded home activities program - 04/19/2017   Status On-going     OT LONG TERM GOAL #6   Title Pt will be  mod I in putting dishes away in cabinets    Status On-going     OT LONG TERM GOAL #7   Title Pt will demonstrate ability to pick up light weight object from floor with no more than supervision   Status On-going     OT LONG TERM GOAL #8   Title Pt will demonstrate ability to push grocery cart with both UE's in store while assisting dtr in simple shopping task.    Status On-going     OT LONG TERM GOAL  #9   Baseline Pt will demonstrate ability to carry at least 2 pound object in R hand while ambulating.   Status On-going               Plan - 03/24/17 1241    Clinical Impression Statement Pt progressing toward goals and has met all STG"s.  Progressing toward LTG's.    Rehab Potential Fair   Current Impairments/barriers affecting progress: length of time since CVA, learned habits   OT Frequency 2x / week   OT Duration 8 weeks   OT Treatment/Interventions Self-care/ADL training;Neuromuscular education;Therapeutic exercise;Passive range of motion;Manual Therapy;Functional Mobility Training;Splinting;Therapeutic exercises;Therapeutic activities;Balance training;Patient/family education   Plan NMR for trunk, RUE functional use, functional ambulation, transitions, balance, postural alignment and control   Consulted and Agree with Plan of Care Patient;Family member/caregiver   Family Member Consulted dtr- [place], granddtr      Patient will benefit from skilled therapeutic intervention in order to improve the following deficits and impairments:  Abnormal gait, Decreased activity tolerance, Decreased balance, Decreased cognition, Decreased range of motion, Decreased mobility, Decreased strength, Difficulty walking, Impaired UE functional use, Impaired tone, Impaired perceived functional ability  Visit Diagnosis: 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 Hemiplegia  affecting dominant side, post-stroke ([place]) 03/07/2017  . Spastic hemiplegia affecting dominant side ([place]) 02/07/2017  . Hemiparesis affecting right side as late effect of cerebrovascular accident ([place]) 12/02/2016  . Right foot drop 12/02/2016  . 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 Depression due to dementia 01/28/2015  . Dissection of vertebral artery ([place]) 01/28/2015  . Amnestic MCI (mild cognitive impairment with memory  loss) 01/28/2015  . Hemiplegia following CVA (cerebrovascular accident) ([place]) 05/31/2014  . 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 Left pontine stroke ([place]) 05/31/2014  . MCI (mild cognitive impairment) with memory loss 05/31/2014    Quay Burow, OTR/L 03/24/2017, 12:44 PM  [facility] [address] [place], [place], [address] Phone: [phone]   Fax:  [phone]  Name: [name] MRN: [id number] Date of Birth: September 24, 1940

## 2017-03-25 ENCOUNTER — Ambulatory Visit: Payer: Medicare Other | Admitting: Rehabilitation

## 2017-03-29 ENCOUNTER — Encounter: Payer: Self-pay | Admitting: Physical Medicine & Rehabilitation

## 2017-03-29 ENCOUNTER — Ambulatory Visit (HOSPITAL_BASED_OUTPATIENT_CLINIC_OR_DEPARTMENT_OTHER): Payer: Medicare Other | Admitting: Physical Medicine & Rehabilitation

## 2017-03-29 ENCOUNTER — Ambulatory Visit: Payer: Medicare Other

## 2017-03-29 ENCOUNTER — Encounter: Payer: Medicare Other | Attending: Physical Medicine & Rehabilitation

## 2017-03-29 VITALS — BP 148/89 | HR 82 | Temp 98.6°F

## 2017-03-29 DIAGNOSIS — G8110 Spastic hemiplegia affecting unspecified side: Secondary | ICD-10-CM | POA: Diagnosis not present

## 2017-03-29 DIAGNOSIS — I69353 Hemiplegia and hemiparesis following cerebral infarction affecting right non-dominant side: Secondary | ICD-10-CM

## 2017-03-29 DIAGNOSIS — R2689 Other abnormalities of gait and mobility: Secondary | ICD-10-CM

## 2017-03-29 NOTE — Therapy (Signed)
[facility] [address] [place], [place], [address] Phone: [phone]   Fax:  [phone]  Physical Therapy Treatment  Patient Details  Name: [name] MRN: [id number] Date of Birth: 21-Sep-1940 Referring Provider: Dr. Brett Fairy  Encounter Date: 03/29/2017      PT End of Session - 03/29/17 1411    Visit Number 11   Number of Visits 26   Date for PT Re-Evaluation 05/21/17   Authorization Type UHC Medicare: G-CODE AND PROGRESS NOTE EVERY 10 VISITS.    PT Start Time 1019   PT Stop Time 1102   PT Time Calculation (min) 43 min   Equipment Utilized During Treatment --  min guard to S prn   Activity Tolerance Patient tolerated treatment well   Behavior During Therapy WFL for tasks assessed/performed      Past Medical History:  Diagnosis Date  . CKD (chronic kidney disease), stage II   . CVA (cerebral infarction)    Left thalamic, right-sided weakness wih aphagia, right leg brace  . Dyslipidemia   . Elevated PSA    biopsy negative in 2008  . GERD (gastroesophageal reflux disease)   . Hypertension    without medication since 2006  . Major depression   . Seasonal rhinitis     Past Surgical History:  Procedure Laterality Date  . NASAL SEPTUM SURGERY    . VASECTOMY      There were no vitals filed for this visit.      Subjective Assessment - 03/29/17 1023    Subjective Pt denied falls or changes since last visit. Pt's dtr reported pt is taking memory medication and she'll bring it next visit to update chart. Pt has appt. with Dr. Letta Pate today.    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 No/denies                          [facility] Adult PT Treatment/Exercise - 03/29/17 1023      Ambulation/Gait   Ambulation/Gait Yes   Ambulation/Gait Assistance 5: Supervision;4: Min guard   Ambulation/Gait Assistance Details Extensive cues (verbal and tactile and demo) to improve B heel strike, L step length, upright posture, RLE IR, stay within RW, and to slow down speed. Pt required 3 rest breaks 2/2 fatigue.    Ambulation Distance (Feet) 75 Feet  115   Assistive device Rolling walker   Gait Pattern Step-through pattern;Decreased stride length;Decreased arm swing - right;Decreased dorsiflexion - right;Decreased hip/knee flexion - right;Right hip hike;Decreased trunk rotation   Ambulation Surface Level;Indoor   Pre-Gait Activities PT utilized mirror and 2" foam beam to improve ant./lateral weight shifting, B Heel strike, decr. RLE IR, and improve upright posture. PT focused on each deviation x10 reps/LE, then altogether x5 reps. Pt then had pt amb. over even terrain. PT incr. RW height, but this incr. B shoulder shrug.                  PT Short Term Goals - 03/23/17 2220      PT SHORT TERM GOAL #3   Title Pt will improve gait speed to >/= 1,4 ft/sec. with RW to safely amb. in the community.  04-21-17   Baseline  gait velocity 34.5 secs = .95 ft/sec with RW on 03-22-17   Time 4   Period Weeks   Status New     PT SHORT TERM GOAL #4   Title Obtain consult for new AFO for RLE after Botox injection scheduled in early August.  04-21-17   Time 4   Period Weeks   Status New     PT SHORT TERM GOAL #5   Title Improve TUG score to </= 24 secs with RW for improved mobility.  04-21-17   Baseline 32.86 secs with RW   Time 4   Period Weeks   Status New     Additional Short Term Goals   Additional Short Term Goals Yes     PT SHORT TERM GOAL #6   Title Independent in updated HEP including  aquatic exercises.  04-21-17   Time 4   Period Weeks   Status New           PT Long Term Goals - 03/23/17 2213      PT LONG  TERM GOAL #1   Title Pt will verbalize understanding of risk factors/signs/symptoms of CVA to reduce risk of add'l CVA. TARGET DATE FOR ALL LTGS: 02/22/17   Status Achieved     PT LONG TERM GOAL #2   Title Pt will amb. 500' with RW with SBA on flat, even surface with min cues to increase RLE external rotation.  05-21-17   Time 8   Period Weeks   Status New     PT LONG TERM GOAL #3   Title Pt will report zero falls in last 4 weeks to improve safety.   Baseline daughter reports pt fell at her brother's house last week   Status Not Met     PT LONG TERM GOAL #4   Title Pt will improve BERG score to >/=44/56 to decr. falls risk.  05-21-17   Time 8   Period Weeks   Status On-going     PT LONG TERM GOAL #5   Title Obtain new AFO for RLE to minimize gait deviations.  05-21-17   Time 8   Period Weeks   Status New               Plan - 03/29/17 1411    Clinical Impression Statement Pt required extensive cues to improve gait deviations. Therefore, PT spent session breaking down each gait task, utilizing mirror and 2" foam beam to improve pt's gait deviations and weight shifting. Pt demonstrated improvement during first 10' of amb. and then returned to gait deviations. Pt would benefit from skilled PT to improve safety during functional mobility.    Rehab Potential Good   Clinical Impairments Affecting Rehab Potential see PMH, and CVA 13 years ago; severity of tone   PT Frequency 2x / week   PT Duration 8 weeks   PT Treatment/Interventions ADLs/Self Care Home Management;Biofeedback;Canalith Repostioning;Electrical Stimulation;Neuromuscular re-education;Balance training;Therapeutic exercise;Therapeutic activities;Manual techniques;Functional mobility training;Stair training;Orthotic Fit/Training;Gait training;DME Instruction;Patient/family education;Vestibular;Aquatic Therapy   PT Next Visit Plan continue gait with RW and balance training:  core stabilization-    Consulted and Agree with  Plan of Care Patient;Family member/caregiver   Family Member Consulted [name]      Patient will benefit from skilled therapeutic intervention in order to improve the following deficits and impairments:  Decreased endurance, Abnormal gait, Decreased knowledge of use of DME, Decreased balance, Decreased mobility, Dizziness, Impaired flexibility, Postural dysfunction, Decreased coordination, Decreased strength, Impaired tone  Visit Diagnosis: Other  abnormalities of gait and mobility  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 Hemiplegia affecting dominant side, post-stroke ([place]) 03/07/2017  . Spastic hemiplegia affecting dominant side ([place]) 02/07/2017  . Hemiparesis affecting right side as late effect of cerebrovascular accident ([place]) 12/02/2016  . Right foot drop 12/02/2016  . 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 Depression due to dementia 01/28/2015  . Dissection of vertebral artery ([place]) 01/28/2015  . 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 Hemiplegia following CVA (cerebrovascular accident) ([place]) 05/31/2014  . 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 Left pontine stroke ([place]) 05/31/2014  . MCI (mild cognitive impairment) with memory loss 05/31/2014    Miller,Jennifer L 03/29/2017, 2:13 PM  [facility] [address] [place], [place], [address] Phone: [phone]   Fax:  [phone]  Name: [name] MRN: [id number] Date of Birth: 1941-01-14  Geoffry Paradise, PT,DPT 03/29/17 2:14 PM Phone: [phone] Fax: [phone]

## 2017-03-29 NOTE — Progress Notes (Signed)
Subjective:    Patient ID: [name], male    DOB: [birth year], 76 y.o.   MRN: [id number]  HPI patient with history of left mid brain infarct resulting in right hemiparesis, 2005. Has been seen in PT, OT. Spasticity has been an issue with his mobility. According to his daughter has had long-term issues with turning his foot inward on the right side and having problem with keeping his foot flat as well as curling at the toes. Underwent phenol, neural lysis of the right tibial nerve on 02/07/2017. Postprocedure, his daughter has noted improvement in foot positioning including keeping the foot flat on the floor rather than turning upon its side.  Pain Inventory Average Pain 0 Pain Right Now 0 My pain is .  In the last 24 hours, has pain interfered with the following? General activity 0 Relation with others 0 Enjoyment of life 0 What TIME of day is your pain at its worst? . Sleep (in general) .  Pain is worse with: . Pain improves with: . Relief from Meds: .  Mobility walk without assistance walk with assistance use a cane use a walker ability to climb steps?  no do you drive?  no  Function disabled: date disabled 2005 retired I need assistance with the following:  meal prep, household duties and shopping  Neuro/Psych weakness trouble walking dizziness  Prior Studies .  Physicians involved in your care .   Family History  Problem Relation Age of Onset  . Colon cancer Brother    Social History   Social History  . Marital status: Widowed    Spouse name: [name]  . Number of children: 2  . Years of education: 5912   Social History Main Topics  . Smoking status: Never Smoker  . Smokeless tobacco: Never Used  . Alcohol use No  . Drug use: No  . Sexual activity: Not Asked   Other Topics Concern  . None   Social History Narrative   Patient is married [name]([name]) and lives at home with his wife.   Patient is disabled.    Patient has two adult children and two  grandchildren.   Patient has a high school education.   Patient is left-handed.   Patient drinks one cup of coffee daily.            Past Surgical History:  Procedure Laterality Date  . NASAL SEPTUM SURGERY    . VASECTOMY     Past Medical History:  Diagnosis Date  . CKD (chronic kidney disease), stage II   . CVA (cerebral infarction)    Left thalamic, right-sided weakness wih aphagia, right leg brace  . Dyslipidemia   . Elevated PSA    biopsy negative in 2008  . GERD (gastroesophageal reflux disease)   . Hypertension    without medication since 2006  . Major depression   . Seasonal rhinitis    BP (!) 148/89   Pulse 82   Temp 98.6 F (37 C)   SpO2 95%   Opioid Risk Score:   Fall Risk Score:  `1  Depression screen PHQ 2/9  [facility] 02/07/2017 06/30/2015 05/31/2014  Decreased Interest 0 0 0  Down, Depressed, Hopeless 0 0 2  PHQ - 2 Score 0 0 2  Altered sleeping 3 - 1  Tired, decreased energy 0 - 0  Change in appetite 0 - 0  Feeling bad or failure about yourself  0 - 0  Trouble concentrating 0 - 2  Moving slowly or fidgety/restless 0 - 1  Suicidal thoughts 0 - 1  PHQ-9 Score 3 - 7    Review of Systems  Constitutional: Negative.   HENT: Negative.   Eyes: Negative.   Respiratory: Negative.   Cardiovascular: Negative.   Gastrointestinal: Negative.   Endocrine: Negative.   Musculoskeletal: Negative.   Skin: Negative.   Allergic/Immunologic: Negative.   Neurological: Negative.   Hematological: Negative.   Psychiatric/Behavioral: Negative.   All other systems reviewed and are negative.      Objective:   Physical Exam  Constitutional: He is oriented to person, place, and time. He appears well-developed and well-nourished.  HENT:  Head: Normocephalic and atraumatic.  Eyes: Pupils are equal, round, and reactive to light. Conjunctivae and EOM are normal.  Neurological: He is alert and oriented to person, place, and time. Gait abnormal.    Ambulates with stiff legged gait. Foot without evidence of inversion with AFO on. However, when AFO is removed. There is mild inversion as well as moderate toe curling in the second through fifth digits on the right side. Minimal curling of the hallux. There is mild equinus but is able to get his heel on the floor with his shoe off. Right quad has no tenderness. Palpation there is no knee swelling. With ambulation, he has extension of the knee. No flexion during stance phase or swing phase, no knee buckling  Psychiatric: He has a normal mood and affect.  Nursing note and vitals reviewed.         Assessment & Plan:  1. Right spastic hemiplegia secondary to left mid brain infarct, improvement in equinovarus spasticity after phenol injection. Right tibial nerve Has residual quadriceps spasticity. Discussed pros and cons of Botox injection of the right quadricep, including potential of causing knee buckling rather than hyperextension. Patient and daughter understand this and agreed to proceed  Botox100 units. Right quadriceps Rectus femoris 25 VMO 25 Vastus lateralis 25 Vastus intermedius, 25

## 2017-03-31 ENCOUNTER — Ambulatory Visit: Payer: Medicare Other | Admitting: Rehabilitation

## 2017-03-31 ENCOUNTER — Encounter: Payer: Self-pay | Admitting: Rehabilitation

## 2017-03-31 DIAGNOSIS — I69353 Hemiplegia and hemiparesis following cerebral infarction affecting right non-dominant side: Secondary | ICD-10-CM

## 2017-03-31 DIAGNOSIS — R2681 Unsteadiness on feet: Secondary | ICD-10-CM

## 2017-03-31 DIAGNOSIS — R293 Abnormal posture: Secondary | ICD-10-CM

## 2017-03-31 DIAGNOSIS — R2689 Other abnormalities of gait and mobility: Secondary | ICD-10-CM

## 2017-03-31 NOTE — Therapy (Signed)
[facility] [address] [place], [place], [address] Phone: [phone]   Fax:  [phone]  Physical Therapy Treatment  Patient Details  Name: [name] MRN: [id number] Date of Birth: May 29, 1941 Referring Provider: Dr. Brett Fairy  Encounter Date: 03/31/2017      PT End of Session - 03/31/17 1259    Visit Number 12   Number of Visits 26   Date for PT Re-Evaluation 05/21/17   Authorization Type UHC Medicare: G-CODE AND PROGRESS NOTE EVERY 10 VISITS.    PT Start Time 1102   PT Stop Time 1146   PT Time Calculation (min) 44 min   Equipment Utilized During Treatment --  min guard to S prn   Activity Tolerance Patient tolerated treatment well   Behavior During Therapy WFL for tasks assessed/performed      Past Medical History:  Diagnosis Date  . CKD (chronic kidney disease), stage II   . CVA (cerebral infarction)    Left thalamic, right-sided weakness wih aphagia, right leg brace  . Dyslipidemia   . Elevated PSA    biopsy negative in 2008  . GERD (gastroesophageal reflux disease)   . Hypertension    without medication since 2006  . Major depression   . Seasonal rhinitis     Past Surgical History:  Procedure Laterality Date  . NASAL SEPTUM SURGERY    . VASECTOMY      There were no vitals filed for this visit.      Subjective Assessment - 03/31/17 1109    Subjective Pt reports two falls (fell in bathroom then getting up, fell again).  Note he was walking in restroom without RW.     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 No/denies                         [facility] Adult PT Treatment/Exercise - 03/31/17 0001      Ambulation/Gait   Ambulation/Gait Yes   Ambulation/Gait Assistance 5: Supervision;4: Min guard   Ambulation/Gait Assistance Details Gait with RW and R AFO in order to address quality of gait.  Cues for lighter use of RW to allow more active involvment of LEs to decrease tone.  Also cues for decreased RLE external rotation which he does very well when gait speed is slower and is cued for this.     Ambulation Distance (Feet) 115 Feet   Assistive device Rolling walker   Gait Pattern Step-through pattern;Decreased stride length;Decreased arm swing - right;Decreased dorsiflexion - right;Decreased hip/knee flexion - right;Right hip hike;Decreased trunk rotation   Ambulation Surface Level;Indoor   Gait Comments Continued with gait to better simulate negotiation in home and smaller areas with figure 8 around hula hoops with RW.  Cues to point R foot in direction of turns which seemed to help.  Also provided cues for keeping RW closer to him, esp when turning to the L as walker tends to move past him and LLE "trips" on RW.       Therapeutic Activites    Therapeutic Activities Other Therapeutic Activities   Other Therapeutic Activities Addressed simulated bathroom negotiation with RW during session due to recent fall.  Note pt typically ambulates to bathroom door,  leaves RW and then walks along counter top to toilet room in which there are grab bars and then performs toileting tasks.  Provided education for ambulating into restroom with RW, leaving RW to the side and utilizing grab bars to get into smaller toilet room.  Simulated this as best able during session x 2 reps, having pt also simulate standing pulling "pants" (theraband) to better assess balance.  Pt able to maintain balance as he keeps a wide BOS.  Pts daugher stating that she feels he would be safer if toilet room door removed.  They plan to do this tonight. Pt verbalized understanding.                 PT Education - 03/31/17 1258     Education provided Yes   Education Details safety with negotiation to/from restroom   Person(s) Educated Patient;Child(ren)   Methods Explanation   Comprehension Verbalized understanding          PT Short Term Goals - 03/23/17 2220      PT SHORT TERM GOAL #3   Title Pt will improve gait speed to >/= 1,4 ft/sec. with RW to safely amb. in the community.  04-21-17   Baseline gait velocity 34.5 secs = .95 ft/sec with RW on 03-22-17   Time 4   Period Weeks   Status New     PT SHORT TERM GOAL #4   Title Obtain consult for new AFO for RLE after Botox injection scheduled in early August.  04-21-17   Time 4   Period Weeks   Status New     PT SHORT TERM GOAL #5   Title Improve TUG score to </= 24 secs with RW for improved mobility.  04-21-17   Baseline 32.86 secs with RW   Time 4   Period Weeks   Status New     Additional Short Term Goals   Additional Short Term Goals Yes     PT SHORT TERM GOAL #6   Title Independent in updated HEP including  aquatic exercises.  04-21-17   Time 4   Period Weeks   Status New           PT Long Term Goals - 03/23/17 2213      PT LONG TERM GOAL #1   Title Pt will verbalize understanding of risk factors/signs/symptoms of CVA to reduce risk of add'l CVA. TARGET DATE FOR ALL LTGS: 02/22/17   Status Achieved     PT LONG TERM GOAL #2   Title Pt will amb. 500' with RW with SBA on flat, even surface with min cues to increase RLE external rotation.  05-21-17   Time 8   Period Weeks   Status New     PT LONG TERM GOAL #3   Title Pt will report zero falls in last 4 weeks to improve safety.   Baseline daughter reports pt fell at her brother's house last week   Status Not Met     PT LONG TERM GOAL #4   Title Pt will improve BERG score to >/=44/56 to decr. falls risk.  05-21-17   Time 8   Period Weeks   Status On-going     PT LONG TERM GOAL #5   Title Obtain new AFO for RLE to minimize gait deviations.  05-21-17   Time 8   Period Weeks   Status  New               Plan - 03/31/17 1300  Clinical Impression Statement Skilled session focused on safe negotiation into simulated restroom with RW and with use of grab bars.  Also worked on Set designer of turns with RW as this tends to be difficult and cause tripping (either over his own feet or on RW).  Pt demonstrates improvement within session, but needs continued cues when leaving clinic.     Rehab Potential Good   Clinical Impairments Affecting Rehab Potential see PMH, and CVA 13 years ago; severity of tone   PT Frequency 2x / week   PT Duration 8 weeks   PT Treatment/Interventions ADLs/Self Care Home Management;Biofeedback;Canalith Repostioning;Electrical Stimulation;Neuromuscular re-education;Balance training;Therapeutic exercise;Therapeutic activities;Manual techniques;Functional mobility training;Stair training;Orthotic Fit/Training;Gait training;DME Instruction;Patient/family education;Vestibular;Aquatic Therapy   PT Next Visit Plan continue gait with RW and balance training:  core stabilization-, RLE NMR, tone management   Consulted and Agree with Plan of Care Patient;Family member/caregiver   Family Member Consulted [name]      Patient will benefit from skilled therapeutic intervention in order to improve the following deficits and impairments:  Decreased endurance, Abnormal gait, Decreased knowledge of use of DME, Decreased balance, Decreased mobility, Dizziness, Impaired flexibility, Postural dysfunction, Decreased coordination, Decreased strength, Impaired tone  Visit Diagnosis: Other abnormalities of gait and mobility  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 Hemiplegia affecting dominant side, post-stroke ([place]) 03/07/2017  . Spastic hemiplegia affecting dominant side ([place]) 02/07/2017  .  Hemiparesis affecting right side as late effect of cerebrovascular accident ([place]) 12/02/2016  . Right foot drop 12/02/2016  . 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 Depression due to dementia 01/28/2015  . Dissection of vertebral artery ([place]) 01/28/2015  . 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 Hemiplegia following CVA (cerebrovascular accident) ([place]) 05/31/2014  . 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 Left pontine stroke ([place]) 05/31/2014  . MCI (mild cognitive impairment) with memory loss 05/31/2014    Cameron Sprang, PT, MPT [facility] [address] [place], [place], [address] Phone: [phone]   Fax:  [phone] 03/31/17, 1:04 PM  Name: [name] MRN: [id number] Date of Birth: 07/23/1941

## 2017-04-05 ENCOUNTER — Encounter: Payer: Self-pay | Admitting: Occupational Therapy

## 2017-04-05 ENCOUNTER — Ambulatory Visit: Payer: Medicare Other | Admitting: Occupational Therapy

## 2017-04-05 ENCOUNTER — Ambulatory Visit: Payer: Medicare Other | Admitting: Physical Therapy

## 2017-04-05 DIAGNOSIS — R293 Abnormal posture: Secondary | ICD-10-CM

## 2017-04-05 DIAGNOSIS — R2689 Other abnormalities of gait and mobility: Secondary | ICD-10-CM

## 2017-04-05 DIAGNOSIS — I69353 Hemiplegia and hemiparesis following cerebral infarction affecting right non-dominant side: Secondary | ICD-10-CM

## 2017-04-05 DIAGNOSIS — R2681 Unsteadiness on feet: Secondary | ICD-10-CM

## 2017-04-05 NOTE — Therapy (Signed)
[facility] [address] [place], [place], [address] Phone: [phone]   Fax:  [phone]  Physical Therapy Treatment  Patient Details  Name: [name] MRN: [id number] Date of Birth: 1941/01/08 Referring Provider: Dr. Brett Fairy  Encounter Date: 04/05/2017      PT End of Session - 04/05/17 1630    Visit Number 13   Number of Visits 26   Date for PT Re-Evaluation 05/21/17   Authorization Type [employer] Medicare: G-CODE AND PROGRESS NOTE EVERY 10 VISITS.    PT Start Time 1018   PT Stop Time 1101   PT Time Calculation (min) 43 min   Equipment Utilized During Treatment Gait belt   Activity Tolerance Patient tolerated treatment well   Behavior During Therapy WFL for tasks assessed/performed      Past Medical History:  Diagnosis Date  . CKD (chronic kidney disease), stage II   . CVA (cerebral infarction)    Left thalamic, right-sided weakness wih aphagia, right leg brace  . Dyslipidemia   . Elevated PSA    biopsy negative in 2008  . GERD (gastroesophageal reflux disease)   . Hypertension    without medication since 2006  . Major depression   . Seasonal rhinitis     Past Surgical History:  Procedure Laterality Date  . NASAL SEPTUM SURGERY    . VASECTOMY      There were no vitals filed for this visit.      Subjective Assessment - 04/05/17 1619    Subjective Pt and daughter interested in looking at different AFO options possibly for after he gets Botox. 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 No/denies                         [facility] Adult PT Treatment/Exercise - 04/05/17 0001      Ambulation/Gait   Ambulation/Gait Yes   Ambulation/Gait Assistance 5: Supervision;4: Min guard   Ambulation Distance (Feet) 70 Feet  x 4; 20 ft with [employer]ach ground reaction AFO   Assistive device Rolling walker   Gait Pattern Step-through pattern;Decreased stride length;Decreased arm swing - right;Decreased dorsiflexion - right;Decreased hip/knee flexion - right;Right hip hike;Decreased trunk rotation   Ambulation Surface Level;Indoor   Gait velocity 35.53 sec (0.93 ft/sec) with blue rocker;  26.88 sec (1.22 ft/sec) with current AFO   Pre-Gait Activities Discussed with patient and daughter, briefly what each carbon-AFO could offer to try to improve gait pattern.  Not able to try toe-off AFO, as correct size not available to try.  Assessed skin after use of blue rocker, with slight redness noted lateral aspect of mid-foot, but goes away after 5-10 minute of use of his current brace.   Gait Comments Gait trials using R ground reaction AFO ([employer]ach) x 20 ft, with increased supination and inversion, toeing in, with pt reported discomfort.  Gait trial, 2 reps of 70 ft using R blue rocker AFO with RW, with improved foot placement, slightly decreased toeing in and slightly improved foot clearance.  Gait training with pt's current AFO, with increased pace, but also increased toeing in and supination/inversion positioning with gait.  PT Education - 04/05/17 1629    Education provided Yes   Education Details Potential brace options other than current AFO, following Botox in mid-August; discussed gait pattern changes and potential benefits of blue rocker AFO   Person(s) Educated Patient;Child(ren)   Methods Explanation;Demonstration   Comprehension Verbalized understanding          PT Short Term Goals - 03/23/17 2220      PT SHORT TERM GOAL #3   Title Pt will improve gait speed to >/= 1,4 ft/sec. with RW to safely amb. in the community.  04-21-17   Baseline gait velocity 34.5 secs = .95  ft/sec with RW on 03-22-17   Time 4   Period Weeks   Status New     PT SHORT TERM GOAL #4   Title Obtain consult for new AFO for RLE after Botox injection scheduled in early August.  04-21-17   Time 4   Period Weeks   Status New     PT SHORT TERM GOAL #5   Title Improve TUG score to </= 24 secs with RW for improved mobility.  04-21-17   Baseline 32.86 secs with RW   Time 4   Period Weeks   Status New     Additional Short Term Goals   Additional Short Term Goals Yes     PT SHORT TERM GOAL #6   Title Independent in updated HEP including  aquatic exercises.  04-21-17   Time 4   Period Weeks   Status New           PT Long Term Goals - 03/23/17 2213      PT LONG TERM GOAL #1   Title Pt will verbalize understanding of risk factors/signs/symptoms of CVA to reduce risk of add'l CVA. TARGET DATE FOR ALL LTGS: 02/22/17   Status Achieved     PT LONG TERM GOAL #2   Title Pt will amb. 500' with RW with SBA on flat, even surface with min cues to increase RLE external rotation.  05-21-17   Time 8   Period Weeks   Status New     PT LONG TERM GOAL #3   Title Pt will report zero falls in last 4 weeks to improve safety.   Baseline daughter reports pt fell at her brother's house last week   Status Not Met     PT LONG TERM GOAL #4   Title Pt will improve BERG score to >/=44/56 to decr. falls risk.  05-21-17   Time 8   Period Weeks   Status On-going     PT LONG TERM GOAL #5   Title Obtain new AFO for RLE to minimize gait deviations.  05-21-17   Time 8   Period Weeks   Status New               Plan - 04/05/17 1631    Clinical Impression Statement Patient and daughter interested in possibility of different AFO for when patient undergoes Botox in mid-August, so skilled PT session today focused on trail of several types of AFOs available in clinic today.  Blue rocker AFO on RLE appears to provide stability, but also improve foot clearance and slightly help patient improve foot  placement.  Pt's gait is slower with blue rocker, but again noted slightly improved foot clearance and placement.  Will continue to assess.   Rehab Potential Good   Clinical Impairments Affecting Rehab Potential see PMH, and CVA 13 years ago; severity of tone  PT Frequency 2x / week   PT Duration 8 weeks   PT Treatment/Interventions ADLs/Self Care Home Management;Biofeedback;Canalith Repostioning;Electrical Stimulation;Neuromuscular re-education;Balance training;Therapeutic exercise;Therapeutic activities;Manual techniques;Functional mobility training;Stair training;Orthotic Fit/Training;Gait training;DME Instruction;Patient/family education;Vestibular;Aquatic Therapy   PT Next Visit Plan continue gait with RW and balance training:  core stabilization-, RLE NMR, tone management   Consulted and Agree with Plan of Care Patient;Family member/caregiver   Family Member Consulted [name]      Patient will benefit from skilled therapeutic intervention in order to improve the following deficits and impairments:  Decreased endurance, Abnormal gait, Decreased knowledge of use of DME, Decreased balance, Decreased mobility, Dizziness, Impaired flexibility, Postural dysfunction, Decreased coordination, Decreased strength, Impaired tone  Visit Diagnosis: Other abnormalities of gait and mobility     Problem List Patient Active Problem List   Diagnosis Date Noted  . Hemiplegia affecting dominant side, post-stroke ([place]) 03/07/2017  . Spastic hemiplegia affecting dominant side ([employer]) 02/07/2017  . Hemiparesis affecting right side as late effect of cerebrovascular accident ([place]) 12/02/2016  . Right foot drop 12/02/2016  . 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 Depression due to dementia 01/28/2015  . Dissection of vertebral artery ([place]) 01/28/2015  . 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 Hemiplegia following CVA (cerebrovascular accident) ([place])  05/31/2014  . 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 Left pontine stroke ([place]) 05/31/2014  . MCI (mild cognitive impairment) with memory loss 05/31/2014    Rondey Fallen W. 04/05/2017, 4:34 PM  Frazier Butt., PT   [facility] [address] [place], [place], [address] Phone: [phone]   Fax:  [phone]  Name: Anzel Kearse MRN: [id number] Date of Birth: 1941-07-19

## 2017-04-05 NOTE — Therapy (Signed)
[facility] [address] [place], [place], [address] Phone: [phone]   Fax:  [phone]  Occupational Therapy Treatment  Patient Details  Name: Dennis FavaDonnie Graham MRN: [id number] Date of Birth: 08-30-1941 Referring Provider: Dr. Porfirio Mylararmen Dohmeier  Encounter Date: 04/05/2017      OT End of Session - 04/05/17 1230    Visit Number 14   Number of Visits 24   Date for OT Re-Evaluation 04/19/17   Authorization Type medicare will need G code and PN every 10th visit   Authorization Time Period 60 days   Authorization - Visit Number 14   Authorization - Number of Visits 20   OT Start Time 0933   OT Stop Time 1015   OT Time Calculation (min) 42 min   Activity Tolerance Patient tolerated treatment well      Past Medical History:  Diagnosis Date  . CKD (chronic kidney disease), stage II   . CVA (cerebral infarction)    Left thalamic, right-sided weakness wih aphagia, right leg brace  . Dyslipidemia   . Elevated PSA    biopsy negative in 2008  . GERD (gastroesophageal reflux disease)   . Hypertension    without medication since 2006  . Major depression   . Seasonal rhinitis     Past Surgical History:  Procedure Laterality Date  . NASAL SEPTUM SURGERY    . VASECTOMY      There were no vitals filed for this visit.      Subjective Assessment - 04/05/17 0940    Subjective  I had a fall   Patient is accompained by: Family member  dtr [name]   Pertinent History Pt with CVA 13 years ago; pt's wife passed away earlier this year and pt underwent decline with increase in falls. PT recommeneded OT eval.    Patient Stated Goals I don't know- I am not sure what OT can do for me   Currently in Pain? No/denies                      OT Treatments/Exercises (OP) - 04/05/17 0001      Neurological Re-education Exercises   Other Exercises 1 Neuro re ed to address sit to stand, static and dynamic standing balance  and dynamic standing balance with only one UE support.  Pt with declining reliance on UE's for maintaining his balance while improivng his ability to maintain improved postural orientation and control.  Pt and dtr report a fall this week in bathroom - pt stated "I went in and then got lightheade and woke up on the floor."  Pt admits to several episodes of lightheadeness - recommended that pt and dtr make MD aware. Dtr stated she would follow up with MD                   OT Short Term Goals - 04/05/17 1227      OT SHORT TERM GOAL #1   Title Pt and dtr will be mod I with upgraded home activities program - 03/22/2017   Status Achieved     OT SHORT TERM GOAL #2   Title Pt will demonstrate ability to reach into mid level shelf for light weight object with LUE in good postural alignment   Status Achieved     OT SHORT TERM GOAL #3   Title Pt will demonstrate ability to tolerated approximately 25 minutes of ambulatory activity without rest breaks to decrease fall risk  Status Achieved     OT SHORT TERM GOAL #4   Title Pt will demonstrate ability to complete simple bilateral activities in standing exhibiting good postural alignment   Status Achieved           OT Long Term Goals - 04/05/17 1228      OT LONG TERM GOAL #1   Title Pt and family will be  mod I with home activities program - 02/24/2017   Status Achieved     OT LONG TERM GOAL #2   Title Pt will demonstrate ability to use RUE as non dominant in simple ADL tasks   Status Achieved     OT LONG TERM GOAL #3   Title Pt will demonstrate ability to carry light weight object in R hand while ambulating   Status Achieved     OT LONG TERM GOAL #4   Title Pt will be mod I with simple snack/cold meal prep   Status Achieved     OT LONG TERM GOAL #5   Title Pt and dtr will be mod I with upgraded home activities program - 04/19/2017   Status On-going     OT LONG TERM GOAL #6   Title Pt will be  mod I in putting dishes away  in cabinets   Status Achieved     OT LONG TERM GOAL #7   Title Pt will demonstrate ability to pick up light weight object from floor with no more than supervision   Status On-going     OT LONG TERM GOAL #8   Title Pt will demonstrate ability to push grocery cart with both UE's in store while assisting dtr in simple shopping task.    Status On-going     OT LONG TERM GOAL  #9   Baseline Pt will demonstrate ability to carry at least 2 pound object in R hand while ambulating.   Status On-going               Plan - 04/05/17 1229    Clinical Impression Statement Overall pt progressing and demonstrating improved balance and postural control    Rehab Potential Fair   Current Impairments/barriers affecting progress: length of time since CVA, learned habits   OT Frequency 2x / week   OT Duration 8 weeks   OT Treatment/Interventions Self-care/ADL training;Neuromuscular education;Therapeutic exercise;Passive range of motion;Manual Therapy;Functional Mobility Training;Splinting;Therapeutic exercises;Therapeutic activities;Balance training;Patient/family education   Plan NMR for trunk, RUE functional use in standing, functional ambulation, transitions, balance, postural alignment and control,    Consulted and Agree with Plan of Care Patient;Family member/caregiver   Family Member Consulted dtr- [name]      Patient will benefit from skilled therapeutic intervention in order to improve the following deficits and impairments:  Abnormal gait, Decreased activity tolerance, Decreased balance, Decreased cognition, Decreased range of motion, Decreased mobility, Decreased strength, Difficulty walking, Impaired UE functional use, Impaired tone, Impaired perceived functional ability  Visit Diagnosis: 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 (HCC)  Unsteadiness on feet  Abnormal posture    Problem List Patient Active Problem List   Diagnosis Date Noted   . Hemiplegia affecting dominant side, post-stroke (HCC) 03/07/2017  . Spastic hemiplegia affecting dominant side ([facility]) 02/07/2017  . 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 Right foot drop 12/02/2016  . 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 Depression due to dementia 01/28/2015  . 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 Hemiplegia following CVA (cerebrovascular accident) (HCC) 05/31/2014  . 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 Left pontine stroke (HCC) 05/31/2014  . MCI (mild cognitive impairment) with memory loss 05/31/2014    Norton Pastelulaski, Marquies Wanat Halliday, OTR/L 04/05/2017, 12:35 PM  [facility] [address] [place], [place], [address] Phone: [phone]   Fax:  [phone]  Name: Dennis FavaDonnie Graham MRN: [id number] Date of Birth: [birth year]

## 2017-04-06 ENCOUNTER — Encounter: Payer: Self-pay | Admitting: Occupational Therapy

## 2017-04-06 ENCOUNTER — Ambulatory Visit: Payer: Medicare Other | Attending: Neurology | Admitting: Occupational Therapy

## 2017-04-06 DIAGNOSIS — R2689 Other abnormalities of gait and mobility: Secondary | ICD-10-CM | POA: Diagnosis present

## 2017-04-06 DIAGNOSIS — R293 Abnormal posture: Secondary | ICD-10-CM | POA: Diagnosis present

## 2017-04-06 DIAGNOSIS — R2681 Unsteadiness on feet: Secondary | ICD-10-CM | POA: Diagnosis present

## 2017-04-06 DIAGNOSIS — I69353 Hemiplegia and hemiparesis following cerebral infarction affecting right non-dominant side: Secondary | ICD-10-CM | POA: Diagnosis present

## 2017-04-06 NOTE — Therapy (Signed)
[facility] [address] [place], [place], [address] Phone: [phone]   Fax:  [phone]  Occupational Therapy Treatment  Patient Details  Name: Dennis FavaDonnie Graham MRN: [id number] Date of Birth: 11-20-1940 Referring Provider: Dr. Porfirio Mylararmen Dohmeier  Encounter Date: 04/06/2017      OT End of Session - 04/06/17 1717    Visit Number 15   Number of Visits 24   Date for OT Re-Evaluation 04/19/17   Authorization Type medicare will need G code and PN every 10th visit   Authorization Time Period 60 days   Authorization - Visit Number 15   Authorization - Number of Visits 20   OT Start Time 1446   OT Stop Time 1533   OT Time Calculation (min) 47 min   Activity Tolerance Patient tolerated treatment well      Past Medical History:  Diagnosis Date  . CKD (chronic kidney disease), stage II   . CVA (cerebral infarction)    Left thalamic, right-sided weakness wih aphagia, right leg brace  . Dyslipidemia   . Elevated PSA    biopsy negative in 2008  . GERD (gastroesophageal reflux disease)   . Hypertension    without medication since 2006  . Major depression   . Seasonal rhinitis     Past Surgical History:  Procedure Laterality Date  . NASAL SEPTUM SURGERY    . VASECTOMY      There were no vitals filed for this visit.      Subjective Assessment - 04/06/17 1452    Subjective  I am tired today I had a doctor's appt already and did a lot of walking   Patient is accompained by: Family member  dtr   Pertinent History Pt with CVA 13 years ago; pt's wife passed away earlier this year and pt underwent decline with increase in falls. PT recommeneded OT eval.    Patient Stated Goals I don't know- I am not sure what OT can do for me   Currently in Pain? No/denies                      OT Treatments/Exercises (OP) - 04/06/17 0001      Neurological Re-education Exercises   Other Exercises 1 Neuro re ed to address  trunk control in standing and with functional ambulation, static and dynamic standing balance, transitional movements and postural alignment and control. Pt now able to tolerate increased demand of activity in standing with improved control                   OT Short Term Goals - 04/06/17 1714      OT SHORT TERM GOAL #1   Title Pt and dtr will be mod I with upgraded home activities program - 03/22/2017   Status Achieved     OT SHORT TERM GOAL #2   Title Pt will demonstrate ability to reach into mid level shelf for light weight object with LUE in good postural alignment   Status Achieved     OT SHORT TERM GOAL #3   Title Pt will demonstrate ability to tolerated approximately 25 minutes of ambulatory activity without rest breaks to decrease fall risk   Status Achieved     OT SHORT TERM GOAL #4   Title Pt will demonstrate ability to complete simple bilateral activities in standing exhibiting good postural alignment   Status Achieved           OT Long Term  Goals - 04/06/17 1714      OT LONG TERM GOAL #1   Title Pt and family will be  mod I with home activities program - 02/24/2017   Status Achieved     OT LONG TERM GOAL #2   Title Pt will demonstrate ability to use RUE as non dominant in simple ADL tasks   Status Achieved     OT LONG TERM GOAL #3   Title Pt will demonstrate ability to carry light weight object in R hand while ambulating   Status Achieved     OT LONG TERM GOAL #4   Title Pt will be mod I with simple snack/cold meal prep   Status Achieved     OT LONG TERM GOAL #5   Title Pt and dtr will be mod I with upgraded home activities program - 04/19/2017   Status On-going     OT LONG TERM GOAL #6   Title Pt will be  mod I in putting dishes away in cabinets   Status Achieved     OT LONG TERM GOAL #7   Title Pt will demonstrate ability to pick up light weight object from floor with no more than supervision   Status On-going     OT LONG TERM GOAL #8    Title Pt will demonstrate ability to push grocery cart with both UE's in store while assisting dtr in simple shopping task.    Status On-going     OT LONG TERM GOAL  #9   Baseline Pt will demonstrate ability to carry at least 2 pound object in R hand while ambulating.   Status On-going               Plan - 04/06/17 1714    Clinical Impression Statement Pt with slow but steady progress toward goals. Pt showing decreased reliance on UE's for sit to stand, stand to sit and standing balance.    Rehab Potential Fair   Current Impairments/barriers affecting progress: length of time since CVA, learned habits   OT Frequency 2x / week   OT Duration 8 weeks   OT Treatment/Interventions Self-care/ADL training;Neuromuscular education;Therapeutic exercise;Passive range of motion;Manual Therapy;Functional Mobility Training;Splinting;Therapeutic exercises;Therapeutic activities;Balance training;Patient/family education   Plan NMR for turnk, RUE functional use in standing, functional ambulation, transitions, balance, postural alignment and control   Consulted and Agree with Plan of Care Patient   Family Member Consulted dtr- [name]      Patient will benefit from skilled therapeutic intervention in order to improve the following deficits and impairments:  Abnormal gait, Decreased activity tolerance, Decreased balance, Decreased cognition, Decreased range of motion, Decreased mobility, Decreased strength, Difficulty walking, Impaired UE functional use, Impaired tone, Impaired perceived functional ability  Visit Diagnosis: 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 Hemiplegia affecting dominant side, post-stroke (HCC) 03/07/2017  . Spastic hemiplegia affecting dominant side (HCC) 02/07/2017  . Hemiparesis affecting right side as late  effect of cerebrovascular accident ([facility]CC) 12/02/2016  . Right foot drop 12/02/2016  . 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 Depression due to dementia 01/28/2015  . 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 Hemiplegia following CVA (cerebrovascular accident) ([facility]) 05/31/2014  . 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 Left  pontine stroke (HCC) 05/31/2014  . MCI (mild cognitive impairment) with memory loss 05/31/2014    Norton PastelPulaski, Tasfia Vasseur Halliday, OTR/L 04/06/2017, 5:18 PM  [facility] [address] [place], [place], [address] Phone: [phone]   Fax:  [phone]  Name: Dennis FavaDonnie Graham MRN: [id number] Date of Birth: 10-08-1940

## 2017-04-07 ENCOUNTER — Ambulatory Visit: Payer: Medicare Other | Admitting: Physical Therapy

## 2017-04-07 DIAGNOSIS — R2681 Unsteadiness on feet: Secondary | ICD-10-CM

## 2017-04-07 DIAGNOSIS — R2689 Other abnormalities of gait and mobility: Secondary | ICD-10-CM

## 2017-04-07 DIAGNOSIS — I69353 Hemiplegia and hemiparesis following cerebral infarction affecting right non-dominant side: Secondary | ICD-10-CM | POA: Diagnosis not present

## 2017-04-08 NOTE — Therapy (Signed)
[facility] [address] [place], [place], [address] Phone: [phone]   Fax:  [phone]  Physical Therapy Treatment  Patient Details  Name: [name] MRN: [id number] Date of Birth: 1940-11-22 Referring Provider: Dr. Brett Fairy  Encounter Date: 04/07/2017      PT End of Session - 04/08/17 1437    Visit Number 14   Number of Visits 26   Date for PT Re-Evaluation 05/21/17   Authorization Type UHC Medicare: G-CODE AND PROGRESS NOTE EVERY 10 VISITS.    PT Start Time 1017   PT Stop Time 1103   PT Time Calculation (min) 46 min      Past Medical History:  Diagnosis Date  . CKD (chronic kidney disease), stage II   . CVA (cerebral infarction)    Left thalamic, right-sided weakness wih aphagia, right leg brace  . Dyslipidemia   . Elevated PSA    biopsy negative in 2008  . GERD (gastroesophageal reflux disease)   . Hypertension    without medication since 2006  . Major depression   . Seasonal rhinitis     Past Surgical History:  Procedure Laterality Date  . NASAL SEPTUM SURGERY    . VASECTOMY      There were no vitals filed for this visit.      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 No/denies                         [facility] Adult PT Treatment/Exercise - 04/08/17 0001      Ambulation/Gait   Ambulation/Gait Yes   Ambulation/Gait Assistance 5: Supervision   Ambulation/Gait Assistance Details cues to decrease  speed and attempt to keep RLE exteranlly rotated   Ambulation Distance (Feet) 115 Feet   Assistive device Rolling walker   Gait Pattern Step-through pattern;Decreased stride length;Decreased arm swing - right;Decreased dorsiflexion - right;Decreased hip/knee flexion - right;Right hip hike;Decreased trunk rotation   Ambulation Surface Level;Indoor   Gait Comments pt's AFO used during gait training     Knee/Hip Exercises: Stretches   Passive Hamstring Stretch Right;2 reps;30 seconds   Gastroc Stretch Right;2 reps;30 seconds  with pt seated on mat      NeuroRe-ed: Tall kneeling position with green physioball in front for UE support prn  Weight shifting laterally x 10 reps; partial squats back onto heels x 10 reps with min assist  Moving LLE up/back and then laterally x 10 reps each for weight shifting onto RLE Diagonally sitting back onto each heel for trunk strengthening/core stabilization x 5 reps each            PT Short Term Goals - 03/23/17 2220      PT SHORT TERM GOAL #3   Title Pt will improve gait speed to >/= 1,4 ft/sec. with RW to safely amb. in the community.  04-21-17   Baseline gait velocity 34.5 secs = .95 ft/sec with RW on 03-22-17   Time 4   Period Weeks  Status New     PT SHORT TERM GOAL #4   Title Obtain consult for new AFO for RLE after Botox injection scheduled in early August.  04-21-17   Time 4   Period Weeks   Status New     PT SHORT TERM GOAL #5   Title Improve TUG score to </= 24 secs with RW for improved mobility.  04-21-17   Baseline 32.86 secs with RW   Time 4   Period Weeks   Status New     Additional Short Term Goals   Additional Short Term Goals Yes     PT SHORT TERM GOAL #6   Title Independent in updated HEP including  aquatic exercises.  04-21-17   Time 4   Period Weeks   Status New           PT Long Term Goals - 03/23/17 2213      PT LONG TERM GOAL #1   Title Pt will verbalize understanding of risk factors/signs/symptoms  of CVA to reduce risk of add'l CVA. TARGET DATE FOR ALL LTGS: 02/22/17   Status Achieved     PT LONG TERM GOAL #2   Title Pt will amb. 500' with RW with SBA on flat, even surface with min cues to increase RLE external rotation.  05-21-17   Time 8   Period Weeks   Status New     PT LONG TERM GOAL #3   Title Pt will report zero falls in last 4 weeks to improve safety.   Baseline daughter reports pt fell at her brother's house last week   Status Not Met     PT LONG TERM GOAL #4   Title Pt will improve BERG score to >/=44/56 to decr. falls risk.  05-21-17   Time 8   Period Weeks   Status On-going     PT LONG TERM GOAL #5   Title Obtain new AFO for RLE to minimize gait deviations.  05-21-17   Time 8   Period Weeks   Status New               Plan - 04/08/17 1438    Clinical Impression Statement Pt has active isolated movement in Rt ankle including dorsiflexion, plantarflexion and inversion/eversion, however, spasticity/tone signficantly increases in weight-bearing positions, making isolated movements difficult to perform.  Pt tolerated activities in tall kneeling weel, but c/o fatigue upon completion of these movements.   Rehab Potential Good   Clinical Impairments Affecting Rehab Potential see PMH, and CVA 13 years ago; severity of tone   PT Frequency 2x / week   PT Duration 8 weeks   PT Treatment/Interventions ADLs/Self Care Home Management;Biofeedback;Canalith Repostioning;Electrical Stimulation;Neuromuscular re-education;Balance training;Therapeutic exercise;Therapeutic activities;Manual techniques;Functional mobility training;Stair training;Orthotic Fit/Training;Gait training;DME Instruction;Patient/family education;Vestibular;Aquatic Therapy   PT Next Visit Plan continue gait with RW and balance training:  core stabilization-, RLE NMR, tone management   Consulted and Agree with Plan of Care Patient;Family member/caregiver   Family Member Consulted [name]      Patient  will benefit from skilled therapeutic intervention in order to improve the following deficits and impairments:  Decreased endurance, Abnormal gait, Decreased knowledge of use of DME, Decreased balance, Decreased mobility, Dizziness, Impaired flexibility, Postural dysfunction, Decreased coordination, Decreased strength, Impaired tone  Visit Diagnosis: Unsteadiness on feet  Other abnormalities of gait and mobility     Problem List Patient Active Problem List   Diagnosis Date Noted  . Hemiplegia affecting dominant side, post-stroke ([place]) 03/07/2017  .  Spastic hemiplegia affecting dominant side ([place]) 02/07/2017  . Hemiparesis affecting right side as late effect of cerebrovascular accident ([place]) 12/02/2016  . Right foot drop 12/02/2016  . 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 Depression due to dementia 01/28/2015  . Dissection of vertebral artery ([place]) 01/28/2015  . 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 Hemiplegia following CVA (cerebrovascular accident) ([place]) 05/31/2014  . 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 Left pontine stroke ([place]) 05/31/2014  . MCI (mild cognitive impairment) with memory loss 05/31/2014    Rhyanna Sorce, [place], PT 04/08/2017, 2:44 PM  [facility] [address] [place], [place], [address] Phone: [phone]   Fax:  [phone]  Name: [name] MRN: [id number] Date of Birth: 1940/11/29

## 2017-04-12 ENCOUNTER — Ambulatory Visit: Payer: Medicare Other | Admitting: Physical Therapy

## 2017-04-12 DIAGNOSIS — I69353 Hemiplegia and hemiparesis following cerebral infarction affecting right non-dominant side: Secondary | ICD-10-CM | POA: Diagnosis not present

## 2017-04-12 DIAGNOSIS — R2681 Unsteadiness on feet: Secondary | ICD-10-CM

## 2017-04-12 DIAGNOSIS — R2689 Other abnormalities of gait and mobility: Secondary | ICD-10-CM

## 2017-04-13 NOTE — Therapy (Signed)
[facility] [address] [place], [place], [address] Phone: [phone]   Fax:  [phone]  Physical Therapy Treatment  Patient Details  Name: [name] MRN: [id number] Date of Birth: 1941/01/11 Referring Provider: Dr. Brett Fairy  Encounter Date: 04/12/2017      PT End of Session - 04/13/17 1933    Visit Number 15   Number of Visits 26   Date for PT Re-Evaluation 05/21/17   Authorization Type UHC Medicare: G-CODE AND PROGRESS NOTE EVERY 10 VISITS.    PT Start Time 1016   PT Stop Time 1101   PT Time Calculation (min) 45 min      Past Medical History:  Diagnosis Date  . CKD (chronic kidney disease), stage II   . CVA (cerebral infarction)    Left thalamic, right-sided weakness wih aphagia, right leg brace  . Dyslipidemia   . Elevated PSA    biopsy negative in 2008  . GERD (gastroesophageal reflux disease)   . Hypertension    without medication since 2006  . Major depression   . Seasonal rhinitis     Past Surgical History:  Procedure Laterality Date  . NASAL SEPTUM SURGERY    . VASECTOMY      There were no vitals filed for this visit.      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 No/denies         NeuroRe-ed:  Pt transferred to floor (on red mat); performed tall kneeling - weight shifting laterally: moving LLE up/back and laterally 10 reps each Sitting back on heels- 10 reps with 5 sec hold for quad stretching Rt 1/2 kneeling - head turns side to side with mod assist to maintain balance Amb. On knees forward and  backward x 2 reps on mat on floor with min assist for balance and with verbal cues to lift and push from hip with backwards amb.                [facility] Adult PT Treatment/Exercise - 04/13/17 0001      Ambulation/Gait   Ambulation/Gait Yes   Ambulation/Gait Assistance 5: Supervision   Ambulation/Gait Assistance Details cues to turn Rt foot straight and to pronate foot as able   Ambulation Distance (Feet) 30 Feet  3 reps   Assistive device 1 person hand held assist   Gait Pattern Step-through pattern;Decreased stride length;Decreased arm swing - right;Decreased dorsiflexion - right;Decreased hip/knee flexion - right;Right hip hike;Decreased trunk rotation   Ambulation Surface Level;Indoor   Gait Comments Trialed blue rocker, [place] reaction and toe off AFO to determine which AFO most beneficial      Knee/Hip Exercises: Stretches   Passive Hamstring Stretch Right;1 rep;30 seconds   Gastroc Stretch Right;1 rep;30 seconds  pt in seated position - passive stretch                  PT Short Term Goals - 03/23/17 2220      PT SHORT TERM GOAL #3   Title Pt will improve gait speed to >/= 1,4 ft/sec. with RW to safely amb. in the community.  04-21-17   Baseline gait velocity 34.5 secs = .95 ft/sec with RW on 03-22-17   Time 4   Period Weeks   Status New     PT SHORT TERM GOAL #4   Title Obtain consult for new AFO for RLE after Botox injection scheduled in early August.  04-21-17   Time 4   Period Weeks   Status New     PT SHORT TERM GOAL #5   Title Improve TUG score to </= 24 secs with RW for improved mobility.  04-21-17   Baseline 32.86 secs with RW   Time 4   Period Weeks   Status New     Additional Short Term Goals   Additional Short Term Goals Yes     PT SHORT TERM GOAL #6   Title Independent in updated HEP including  aquatic exercises.  04-21-17   Time 4   Period Weeks   Status New           PT Long Term Goals - 03/23/17 2213      PT LONG TERM GOAL  #1   Title Pt will verbalize understanding of risk factors/signs/symptoms of CVA to reduce risk of add'l CVA. TARGET DATE FOR ALL LTGS: 02/22/17   Status Achieved     PT LONG TERM GOAL #2   Title Pt will amb. 500' with RW with SBA on flat, even surface with min cues to increase RLE external rotation.  05-21-17   Time 8   Period Weeks   Status New     PT LONG TERM GOAL #3   Title Pt will report zero falls in last 4 weeks to improve safety.   Baseline daughter reports pt fell at her brother's house last week   Status Not Met     PT LONG TERM GOAL #4   Title Pt will improve BERG score to >/=44/56 to decr. falls risk.  05-21-17   Time 8   Period Weeks   Status On-going     PT LONG TERM GOAL #5   Title Obtain new AFO for RLE to minimize gait deviations.  05-21-17   Time 8   Period Weeks   Status New               Plan - 04/13/17 2007    Clinical Impression Statement Blue rocker AFO provided most control of supination of Rt ankle with pt reporting this brace was the most comfortable of the 3 AFO's trialed   Clinical Impairments Affecting Rehab Potential see PMH, and CVA 13 years ago; severity of tone   PT Frequency 2x / week   PT Duration 8 weeks   PT Treatment/Interventions ADLs/Self Care Home Management;Biofeedback;Canalith Repostioning;Electrical Stimulation;Neuromuscular re-education;Balance training;Therapeutic exercise;Therapeutic activities;Manual techniques;Functional mobility training;Stair training;Orthotic Fit/Training;Gait training;DME Instruction;Patient/family education;Vestibular;Aquatic Therapy   PT Next Visit Plan continue gait with RW and balance training:  core stabilization-, RLE NMR, tone management   Consulted and Agree with Plan of Care Patient;Family member/caregiver   Family Member Consulted [name]      Patient will benefit from skilled therapeutic intervention in order to improve the following deficits and impairments:  Decreased endurance, Abnormal  gait, Decreased knowledge of use of DME, Decreased balance, Decreased mobility, Dizziness, Impaired flexibility, Postural dysfunction, Decreased coordination, Decreased strength, Impaired tone  Visit Diagnosis: Other abnormalities of gait and mobility  Unsteadiness on feet  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 [facility])     Problem List Patient Active  Problem List   Diagnosis Date Noted  . Hemiplegia affecting dominant side, post-stroke ([place]) 03/07/2017  . Spastic hemiplegia affecting dominant side ([place]) 02/07/2017  . Hemiparesis affecting right side as late effect of cerebrovascular accident ([place]) 12/02/2016  . Right foot drop 12/02/2016  . 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 Depression due to dementia 01/28/2015  . Dissection of vertebral artery ([place]) 01/28/2015  . 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 Hemiplegia following CVA (cerebrovascular accident) ([place]) 05/31/2014  . 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 Left pontine stroke ([place]) 05/31/2014  . MCI (mild cognitive impairment) with memory loss 05/31/2014    Genella Bas, [place], PT 04/13/2017, 8:13 PM  [facility] [address] [place], [place], [address] Phone: [phone]   Fax:  [phone]  Name: [name] MRN: [id number] Date of Birth: [birth year]

## 2017-04-14 ENCOUNTER — Encounter: Payer: Self-pay | Admitting: Occupational Therapy

## 2017-04-14 ENCOUNTER — Ambulatory Visit: Payer: Medicare Other | Admitting: Occupational Therapy

## 2017-04-14 DIAGNOSIS — I69353 Hemiplegia and hemiparesis following cerebral infarction affecting right non-dominant side: Secondary | ICD-10-CM | POA: Diagnosis not present

## 2017-04-14 DIAGNOSIS — R2681 Unsteadiness on feet: Secondary | ICD-10-CM

## 2017-04-14 DIAGNOSIS — R293 Abnormal posture: Secondary | ICD-10-CM

## 2017-04-14 NOTE — Patient Instructions (Signed)
Home Program for Your Arm;  GO SLOW AND BREATHE!!  If Campbell has to hold at the end of the exercise for a count of three it helps to slow him down.  Start with once a day and work toward 2 times per day.   1. Chest presses 10 reps x2 using 2-3 pound weight  2. Overhead reach 10 reps x2 using 2-3 pound weight  3. Can stand and have Ignazio place right arm by his side, palm facing his body. Place book under his left foot to make sure he keeps his weight in the middle.   KEEPING ELBOW STRAIGHT, reach back behind your hip without turning your body. Can use a 2 pound weight in standing.  Can also do this one laying on his belly but I would not use any weight if he is on his belly.  10 reps x2.

## 2017-04-14 NOTE — Therapy (Signed)
[facility] [address] [place], [place], [address] Phone: [phone]   Fax:  [phone]  Occupational Therapy Treatment  Patient Details  Name: [name] MRN: [id number] Date of Birth: September 28, 1940 Referring Provider: Dr. Porfirio Mylararmen Dohmeier  Encounter Date: 04/14/2017      OT End of Session - 04/14/17 1119    Visit Number 16   Number of Visits 24   Date for OT Re-Evaluation 04/19/17   Authorization Type medicare will need G code and PN every 10th visit   Authorization Time Period 60 days   Authorization - Visit Number 16   Authorization - Number of Visits 20   OT Start Time 0804   OT Stop Time 0845   OT Time Calculation (min) 41 min   Activity Tolerance --      Past Medical History:  Diagnosis Date  . CKD (chronic kidney disease), stage II   . CVA (cerebral infarction)    Left thalamic, right-sided weakness wih aphagia, right leg brace  . Dyslipidemia   . Elevated PSA    biopsy negative in 2008  . GERD (gastroesophageal reflux disease)   . Hypertension    without medication since 2006  . Major depression   . Seasonal rhinitis     Past Surgical History:  Procedure Laterality Date  . NASAL SEPTUM SURGERY    . VASECTOMY      There were no vitals filed for this visit.      Subjective Assessment - 04/14/17 0809    Subjective  I can't wait for that injection for my leg and to get my new brace   Patient is accompained by: Family member  dtr   Pertinent History Pt with CVA 13 years ago; pt's wife passed away earlier this year and pt underwent decline with increase in falls. PT recommeneded OT eval.    Patient Stated Goals I don't know- I am not sure what OT can do for me   Currently in Pain? No/denies                      OT Treatments/Exercises (OP) - 04/14/17 0001      Neurological Re-education Exercises   Other Exercises 1 Upgraded HEP for RUE to include activity to address  posterior scapular control as well as to add resistance to existing HEP.  See pt instruction section for details. Pt able to return demonstrate and dtr verbalized understanding.  Dtr able to cue pt through exercises and is dedicated to assisting pt at home.  Also addressed functional ambulation with emphasis on postural alignment and postural control.  Pt continues to slowly improve and requires slightly less cueing and faciliattion.                  OT Education - 04/14/17 1116    Education provided Yes   Education Details upgraded HEP for RUE          OT Short Term Goals - 04/14/17 1117      OT SHORT TERM GOAL #1   Title Pt and dtr will be mod I with upgraded home activities program - 03/22/2017   Status Achieved     OT SHORT TERM GOAL #2   Title Pt will demonstrate ability to reach into mid level shelf for light weight object with LUE in good postural alignment   Status Achieved     OT SHORT TERM GOAL #3   Title Pt will demonstrate ability  to tolerated approximately 25 minutes of ambulatory activity without rest breaks to decrease fall risk   Status Achieved     OT SHORT TERM GOAL #4   Title Pt will demonstrate ability to complete simple bilateral activities in standing exhibiting good postural alignment   Status Achieved           OT Long Term Goals - 04/14/17 1117      OT LONG TERM GOAL #1   Title Pt and family will be  mod I with home activities program - 02/24/2017   Status Achieved     OT LONG TERM GOAL #2   Title Pt will demonstrate ability to use RUE as non dominant in simple ADL tasks   Status Achieved     OT LONG TERM GOAL #3   Title Pt will demonstrate ability to carry light weight object in R hand while ambulating   Status Achieved     OT LONG TERM GOAL #4   Title Pt will be mod I with simple snack/cold meal prep   Status Achieved     OT LONG TERM GOAL #5   Title Pt and dtr will be mod I with upgraded home activities program - 04/19/2017    Status Achieved     OT LONG TERM GOAL #6   Title Pt will be  mod I in putting dishes away in cabinets   Status Achieved     OT LONG TERM GOAL #7   Title Pt will demonstrate ability to pick up light weight object from floor with no more than supervision   Status On-going     OT LONG TERM GOAL #8   Title Pt will demonstrate ability to push grocery cart with both UE's in store while assisting dtr in simple shopping task.    Status On-going     OT LONG TERM GOAL  #9   Baseline Pt will demonstrate ability to carry at least 2 pound object in R hand while ambulating.   Status On-going               Plan - 04/14/17 1117    Clinical Impression Statement Pt continues to make steady progress toward goals.  Pt tolerating more activity at home and in the clinic   Rehab Potential Fair   Current Impairments/barriers affecting progress: length of time since CVA, learned habits   OT Frequency 2x / week   OT Duration 8 weeks   OT Treatment/Interventions Self-care/ADL training;Neuromuscular education;Therapeutic exercise;Passive range of motion;Manual Therapy;Functional Mobility Training;Splinting;Therapeutic exercises;Therapeutic activities;Balance training;Patient/family education   Plan check upgraded HEP, NMR for trunk, RUE functional use in standing, functional ambulation, transitions, balance, postural alignment and control   Consulted and Agree with Plan of Care Patient;Family member/caregiver   Family Member Consulted dtr- [name]      Patient will benefit from skilled therapeutic intervention in order to improve the following deficits and impairments:  Abnormal gait, Decreased activity tolerance, Decreased balance, Decreased cognition, Decreased range of motion, Decreased mobility, Decreased strength, Difficulty walking, Impaired UE functional use, Impaired tone, Impaired perceived functional ability  Visit Diagnosis: Unsteadiness on feet  Spastic hemiplegia of right nondominant side  as late effect of cerebral infarction (HCC)  Abnormal posture    Problem List Patient Active Problem List   Diagnosis Date Noted  . Hemiplegia affecting dominant side, post-stroke (HCC) 03/07/2017  . Spastic hemiplegia affecting dominant side (HCC) 02/07/2017  . 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  Right foot drop 12/02/2016  . 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 Depression due to dementia 01/28/2015  . 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 Hemiplegia following CVA (cerebrovascular accident) (HCC) 05/31/2014  . 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 Left pontine stroke (HCC) 05/31/2014  . MCI (mild cognitive impairment) with memory loss 05/31/2014    Norton Pastel, OTR/L 04/14/2017, 11:20 AM  [facility] [address] [place], [place], [address] Phone: [phone]   Fax:  [phone]  Name: [name] MRN: [id number] Date of Birth: 04-15-1941

## 2017-04-15 ENCOUNTER — Ambulatory Visit: Payer: Medicare Other

## 2017-04-15 ENCOUNTER — Ambulatory Visit: Payer: Medicare Other | Admitting: Occupational Therapy

## 2017-04-15 ENCOUNTER — Encounter: Payer: Self-pay | Admitting: Occupational Therapy

## 2017-04-15 DIAGNOSIS — R2681 Unsteadiness on feet: Secondary | ICD-10-CM

## 2017-04-15 DIAGNOSIS — I69353 Hemiplegia and hemiparesis following cerebral infarction affecting right non-dominant side: Secondary | ICD-10-CM

## 2017-04-15 DIAGNOSIS — R293 Abnormal posture: Secondary | ICD-10-CM

## 2017-04-15 DIAGNOSIS — R2689 Other abnormalities of gait and mobility: Secondary | ICD-10-CM

## 2017-04-15 NOTE — Therapy (Signed)
[facility] [address] [place], [place], [address] Phone: [phone]   Fax:  [phone]  Physical Therapy Treatment  Patient Details  Name: [name] MRN: [id number] Date of Birth: 30-Jul-1941 Referring Provider: Dr. Vickey Huger  Encounter Date: 04/15/2017      PT End of Session - 04/15/17 1400    Visit Number 16   Number of Visits 26   Date for PT Re-Evaluation 05/21/17   Authorization Type UHC Medicare: G-CODE AND PROGRESS NOTE EVERY 10 VISITS.    PT Start Time 1317   PT Stop Time 1357   PT Time Calculation (min) 40 min   Equipment Utilized During Treatment --  min guard to S prn   Activity Tolerance Patient tolerated treatment well   Behavior During Therapy WFL for tasks assessed/performed      Past Medical History:  Diagnosis Date  . CKD (chronic kidney disease), stage II   . CVA (cerebral infarction)    Left thalamic, right-sided weakness wih aphagia, right leg brace  . Dyslipidemia   . Elevated PSA    biopsy negative in 2008  . GERD (gastroesophageal reflux disease)   . Hypertension    without medication since 2006  . Major depression   . Seasonal rhinitis     Past Surgical History:  Procedure Laterality Date  . NASAL SEPTUM SURGERY    . VASECTOMY      There were no vitals filed for this visit.      Subjective Assessment - 04/15/17 1321    Subjective Pt denied falls or changes since last visit. Pt reports RUE wound is getting better.    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 No/denies          NMR:  Tall kneeling squatx x10 reps with 1 UE support on bench as needed. Half kneeling with L foot flat on mat, x5 reps  of trunk rotation, x 5 reps with L hamstring and R hip flexor isometric contractions, x5 reps with trunk rotation and LE isometrics contractions together. Cues and demo for technique.  Tall kneeling with UE support on green physioball for support, roll out and back for core strength x10 reps.                [facility] Adult PT Treatment/Exercise - 04/15/17 1349      Ambulation/Gait   Ambulation/Gait Yes   Ambulation/Gait Assistance 4: Min guard   Ambulation/Gait Assistance Details Cues to improve wt. shifting onto RLE, incr. LLE step length, upright posture, and to decr. UE support on PT's shoulders. Pt required seated rest break 2/2 fatigue.    Ambulation Distance (Feet) 117 Feet   Assistive device 1 person hand held assist   Gait Pattern Step-through pattern;Decreased stride length;Decreased arm swing - right;Decreased dorsiflexion - right;Decreased hip/knee flexion - right;Right hip hike;Decreased trunk rotation   Ambulation Surface Level;Indoor                  PT Short Term Goals - 03/23/17 2220      PT SHORT TERM GOAL #3   Title Pt will improve gait speed to >/= 1,4 ft/sec. with RW to safely amb. in the community.  04-21-17   Baseline gait velocity 34.5  secs = .95 ft/sec with RW on 03-22-17   Time 4   Period Weeks   Status New     PT SHORT TERM GOAL #4   Title Obtain consult for new AFO for RLE after Botox injection scheduled in early August.  04-21-17   Time 4   Period Weeks   Status New     PT SHORT TERM GOAL #5   Title Improve TUG score to </= 24 secs with RW for improved mobility.  04-21-17   Baseline 32.86 secs with RW   Time 4   Period Weeks   Status New     Additional Short Term Goals   Additional Short Term Goals Yes     PT SHORT TERM GOAL #6   Title Independent in updated HEP including  aquatic exercises.  04-21-17   Time 4   Period Weeks   Status New           PT Long Term Goals - 03/23/17 2213      PT LONG TERM GOAL #1   Title Pt will  verbalize understanding of risk factors/signs/symptoms of CVA to reduce risk of add'l CVA. TARGET DATE FOR ALL LTGS: 02/22/17   Status Achieved     PT LONG TERM GOAL #2   Title Pt will amb. 500' with RW with SBA on flat, even surface with min cues to increase RLE external rotation.  05-21-17   Time 8   Period Weeks   Status New     PT LONG TERM GOAL #3   Title Pt will report zero falls in last 4 weeks to improve safety.   Baseline daughter reports pt fell at her brother's house last week   Status Not Met     PT LONG TERM GOAL #4   Title Pt will improve BERG score to >/=44/56 to decr. falls risk.  05-21-17   Time 8   Period Weeks   Status On-going     PT LONG TERM GOAL #5   Title Obtain new AFO for RLE to minimize gait deviations.  05-21-17   Time 8   Period Weeks   Status New               Plan - 04/15/17 1401    Clinical Impression Statement Today's skilled session focused on NMR activities to improve trunk rotation, isolate muscle contractions, and improve core activation. Pt required seated rest breaks during session 2/2 fatigue. Pt continues to require cues for proper STS txf technique and safety. Pt continues to require cues to improve wt. shifting to RLE in order to decr. gait deviations. Continue with POC.    Clinical Impairments Affecting Rehab Potential see PMH, and CVA 13 years ago; severity of tone   PT Frequency 2x / week   PT Duration 8 weeks   PT Treatment/Interventions ADLs/Self Care Home Management;Biofeedback;Canalith Repostioning;Electrical Stimulation;Neuromuscular re-education;Balance training;Therapeutic exercise;Therapeutic activities;Manual techniques;Functional mobility training;Stair training;Orthotic Fit/Training;Gait training;DME Instruction;Patient/family education;Vestibular;Aquatic Therapy   PT Next Visit Plan Begin to check STGs. 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 Hemiplegia affecting dominant side, post-stroke ([place]) 03/07/2017  . Spastic hemiplegia affecting dominant side ([place]) 02/07/2017  . Hemiparesis affecting right side as late effect of cerebrovascular accident ([place]) 12/02/2016  . Right foot drop 12/02/2016  . 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 Depression due to dementia 01/28/2015  . Dissection of vertebral artery ([place]) 01/28/2015  . 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 Hemiplegia following CVA (cerebrovascular accident) ([place]) 05/31/2014  . 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 Left pontine stroke ([place]) 05/31/2014  . MCI (mild cognitive impairment) with memory loss 05/31/2014    Jenalee Trevizo L 04/15/2017, 2:02 PM  [facility] [address] [place], [place], [address] Phone: [phone]   Fax:  [phone]  Name: [name] MRN: [id number] Date of Birth: Apr 24, 1941  Geoffry Paradise, PT,DPT 04/15/17 2:03 PM Phone: [phone] Fax: [phone]

## 2017-04-15 NOTE — Therapy (Signed)
[facility] [address] [place], [place], [address] Phone: [phone]   Fax:  [phone]  Occupational Therapy Treatment  Patient Details  Name: [name] MRN: [id number] Date of Birth: May 04, 1941 Referring Provider: Dr. Porfirio Mylar Dohmeier  Encounter Date: 04/15/2017      OT End of Session - 04/15/17 1641    Visit Number 17   Number of Visits 24   Date for OT Re-Evaluation 04/19/17   Authorization Type medicare will need G code and PN every 10th visit   Authorization Time Period 60 days   Authorization - Visit Number 17   Authorization - Number of Visits 20   OT Start Time 1447   OT Stop Time 1533   OT Time Calculation (min) 46 min      Past Medical History:  Diagnosis Date  . CKD (chronic kidney disease), stage II   . CVA (cerebral infarction)    Left thalamic, right-sided weakness wih aphagia, right leg brace  . Dyslipidemia   . Elevated PSA    biopsy negative in 2008  . GERD (gastroesophageal reflux disease)   . Hypertension    without medication since 2006  . Major depression   . Seasonal rhinitis     Past Surgical History:  Procedure Laterality Date  . NASAL SEPTUM SURGERY    . VASECTOMY      There were no vitals filed for this visit.      Subjective Assessment - 04/15/17 1452    Subjective  I am using my walker more when I go to the bathroom after that last fall.   Patient is accompained by: Family member  dtr dena   Pertinent History Pt with CVA 13 years ago; pt's wife passed away earlier this year and pt underwent decline with increase in falls. PT recommeneded OT eval.    Patient Stated Goals I don't know- I am not sure what OT can do for me   Currently in Pain? No/denies                      OT Treatments/Exercises (OP) - 04/15/17 0001      Neurological Re-education Exercises   Other Exercises 1 Neuro re ed to address sit to stand, postural alignment and control,  balance and functional reach with RUE in standing.  Also addressed picking up ligh weight object off floor - pt needs cues to breath during activity.  Pt able today to weight shift to the right in standing and complete task that required 2 point pinch with RUE in standing with overhead reach.                OT Education - 04/15/17 1638    Education provided Yes   Education Details dtr educated in functional tasks to use for HEP for picking up light items off floor and for funcitonal reach with RUE in standing.    Person(s) Educated Patient;Child(ren)   Methods Explanation;Demonstration;Verbal cues   Comprehension Verbalized understanding;Returned demonstration          OT Short Term Goals - 04/15/17 1639      OT SHORT TERM GOAL #1   Title Pt and dtr will be mod I with upgraded home activities program - 03/22/2017   Status Achieved     OT SHORT TERM GOAL #2   Title Pt will demonstrate ability to reach into mid level shelf for light weight object with LUE in good postural alignment  Status Achieved     OT SHORT TERM GOAL #3   Title Pt will demonstrate ability to tolerated approximately 25 minutes of ambulatory activity without rest breaks to decrease fall risk   Status Achieved     OT SHORT TERM GOAL #4   Title Pt will demonstrate ability to complete simple bilateral activities in standing exhibiting good postural alignment   Status Achieved           OT Long Term Goals - 04/15/17 1640      OT LONG TERM GOAL #1   Title Pt and family will be  mod I with home activities program - 02/24/2017   Status Achieved     OT LONG TERM GOAL #2   Title Pt will demonstrate ability to use RUE as non dominant in simple ADL tasks   Status Achieved     OT LONG TERM GOAL #3   Title Pt will demonstrate ability to carry light weight object in R hand while ambulating   Status Achieved     OT LONG TERM GOAL #4   Title Pt will be mod I with simple snack/cold meal prep   Status  Achieved     OT LONG TERM GOAL #5   Title Pt and dtr will be mod I with upgraded home activities program - 04/19/2017   Status Achieved     OT LONG TERM GOAL #6   Title Pt will be  mod I in putting dishes away in cabinets   Status Achieved     OT LONG TERM GOAL #7   Title Pt will demonstrate ability to pick up light weight object from floor with no more than supervision   Status Achieved     OT LONG TERM GOAL #8   Title Pt will demonstrate ability to push grocery cart with both UE's in store while assisting dtr in simple shopping task.    Status Achieved     OT LONG TERM GOAL  #9   Baseline Pt will demonstrate ability to carry at least 2 pound object in R hand while ambulating.   Status On-going               Plan - 04/15/17 1640    Clinical Impression Statement Pt continues to make excellent progress toward goals. Pt's daughter exceptional support and ability to carry over advanced HEP.    Rehab Potential Fair   Current Impairments/barriers affecting progress: length of time since CVA, learned habits   OT Frequency 2x / week   OT Duration 8 weeks   OT Treatment/Interventions Self-care/ADL training;Neuromuscular education;Therapeutic exercise;Passive range of motion;Manual Therapy;Functional Mobility Training;Splinting;Therapeutic exercises;Therapeutic activities;Balance training;Patient/family education   Plan pt to have botox on RLE - will place on hold until 3 weeks after botox to allow full effect    Consulted and Agree with Plan of Care Patient;Family member/caregiver   Family Member Consulted dtr- [name]      Patient will benefit from skilled therapeutic intervention in order to improve the following deficits and impairments:  Abnormal gait, Decreased activity tolerance, Decreased balance, Decreased cognition, Decreased range of motion, Decreased mobility, Decreased strength, Difficulty walking, Impaired UE functional use, Impaired tone, Impaired perceived functional  ability  Visit Diagnosis: Unsteadiness on feet  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 Hemiplegia affecting dominant side, post-stroke (HCC) 03/07/2017  . Spastic  hemiplegia affecting dominant side (HCC) 02/07/2017  . 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 Right foot drop 12/02/2016  . 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 Depression due to dementia 01/28/2015  . 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 Hemiplegia following CVA (cerebrovascular accident) (HCC) 05/31/2014  . 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 Left pontine stroke (HCC) 05/31/2014  . MCI (mild cognitive impairment) with memory loss 05/31/2014    Norton PastelPulaski, Janya Eveland Halliday, OTR/L 04/15/2017, 4:44 PM  [facility] [address] [place], [place], [address] Phone: [phone]   Fax:  [phone]  Name: [name] MRN: [id number] Date of Birth: 25-Jun-1941

## 2017-04-19 ENCOUNTER — Encounter: Payer: Medicare Other | Admitting: Occupational Therapy

## 2017-04-19 ENCOUNTER — Ambulatory Visit: Payer: Medicare Other

## 2017-04-19 DIAGNOSIS — I69353 Hemiplegia and hemiparesis following cerebral infarction affecting right non-dominant side: Secondary | ICD-10-CM | POA: Diagnosis not present

## 2017-04-19 DIAGNOSIS — R2689 Other abnormalities of gait and mobility: Secondary | ICD-10-CM

## 2017-04-19 DIAGNOSIS — R293 Abnormal posture: Secondary | ICD-10-CM

## 2017-04-19 NOTE — Therapy (Signed)
[facility] [address] [place], [place], [address] Phone: [phone]   Fax:  [phone]  Physical Therapy Treatment  Patient Details  Name: [name] MRN: [id number] Date of Birth: [birth year] Referring Provider: Dr. Brett Fairy  Encounter Date: 04/19/2017      PT End of Session - 04/19/17 1014    Visit Number 17   Number of Visits 26   Date for PT Re-Evaluation 05/21/17   Authorization Type UHC Medicare: G-CODE AND PROGRESS NOTE EVERY 10 VISITS.    PT Start Time [phone]   PT Stop Time 0929   PT Time Calculation (min) 42 min   Equipment Utilized During Treatment --  min guard to S prn   Activity Tolerance Patient tolerated treatment well   Behavior During Therapy [facility] for tasks assessed/performed      Past Medical History:  Diagnosis Date  . CKD (chronic kidney disease), stage II   . CVA (cerebral infarction)    Left thalamic, right-sided weakness wih aphagia, right leg brace  . Dyslipidemia   . Elevated PSA    biopsy negative in 2008  . GERD (gastroesophageal reflux disease)   . Hypertension    without medication since 2006  . Major depression   . Seasonal rhinitis     Past Surgical History:  Procedure Laterality Date  . NASAL SEPTUM SURGERY    . VASECTOMY      There were no vitals filed for this visit.      Subjective Assessment - 04/19/17 0853    Subjective Pt denied falls or changes since last visit.    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 No/denies                         [facility] Adult PT Treatment/Exercise - 04/19/17 0855      Ambulation/Gait   Ambulation/Gait Yes   Ambulation/Gait Assistance 5:  Supervision   Ambulation/Gait Assistance Details Cues to improve R hip/knee flexion, decr. L foot IR, and improve heel strike. Majority of stretches done after first bout of amb. And hip flexor stretch after 3 bout of amb.   Ambulation Distance (Feet) 75 Feet  60'x2   Assistive device Rolling walker   Gait Pattern Step-through pattern;Decreased stride length;Decreased arm swing - right;Decreased dorsiflexion - right;Decreased hip/knee flexion - right;Right hip hike;Decreased trunk rotation   Ambulation Surface Level;Indoor   Gait velocity 1.37f/sec. with RW     Standardized Balance Assessment   Standardized Balance Assessment Timed Up and Go Test     Timed Up and Go Test   TUG Normal TUG   Normal TUG (seconds) 30.91  with RW and 33.86 with RW     Exercises   Exercises Knee/Hip;Ankle     Knee/Hip Exercises: Stretches   Passive Hamstring Stretch 3 reps;Right;Other (comment)  45 sec.   Hip Flexor Stretch Right;1 rep;Other (comment)  2 minutes in supine   Gastroc Stretch Right;3 reps;Other (comment)  45 sec.   Other Knee/Hip Stretches Seated R piriformis stretch 3x45 sec. Cues and demo for technique. All performed in seated position.                PT Education - 04/19/17 1013  Education provided Yes   Education Details PT educated pt on goal progress and outcome measure results. PT educated pt on the importance of performing HEP as prescribed and discussed the differences between stretches and strenthening/ROM exercises.    Person(s) Educated Patient;Child(ren)   Methods Explanation   Comprehension Verbalized understanding          PT Short Term Goals - 04/19/17 1018      PT SHORT TERM GOAL #3   Title Pt will improve gait speed to >/= 1,4 ft/sec. with RW to safely amb. in the community.  04-21-17   Baseline gait velocity 34.5 secs = .95 ft/sec with RW on 03-22-17   Time 4   Period Weeks   Status Achieved     PT SHORT TERM GOAL #4   Title Obtain consult for  new AFO for RLE after Botox injection scheduled in early August.  04-21-17   Time 4   Period Weeks   Status Deferred     PT SHORT TERM GOAL #5   Title Improve TUG score to </= 24 secs with RW for improved mobility.  04-21-17   Baseline 32.86 secs with RW   Time 4   Period Weeks   Status Partially Met     PT SHORT TERM GOAL #6   Title Independent in updated HEP including  aquatic exercises.  04-21-17   Time 4   Period Weeks   Status New           PT Long Term Goals - 03/23/17 2213      PT LONG TERM GOAL #1   Title Pt will verbalize understanding of risk factors/signs/symptoms of CVA to reduce risk of add'l CVA. TARGET DATE FOR ALL LTGS: 02/22/17   Status Achieved     PT LONG TERM GOAL #2   Title Pt will amb. 500' with RW with SBA on flat, even surface with min cues to increase RLE external rotation.  05-21-17   Time 8   Period Weeks   Status New     PT LONG TERM GOAL #3   Title Pt will report zero falls in last 4 weeks to improve safety.   Baseline daughter reports pt fell at her brother's house last week   Status Not Met     PT LONG TERM GOAL #4   Title Pt will improve BERG score to >/=44/56 to decr. falls risk.  05-21-17   Time 8   Period Weeks   Status On-going     PT LONG TERM GOAL #5   Title Obtain new AFO for RLE to minimize gait deviations.  05-21-17   Time 8   Period Weeks   Status New               Plan - 04/19/17 1014    Clinical Impression Statement Pt demonstrated progress as he met STG 3 and partially met STG 5. PT deferred STG 4, as pt has not received botox yet. PT will assess STG 6 (HEP) next session. PT began PT session with stretches, as pt reported incr. spasticity today and did not stretch prior to PT appt. due to early appt. time.  Pt's gait speed and TUG time continue to indicate pt is at risk for falls.    Clinical Impairments Affecting Rehab Potential see PMH, and CVA 13 years ago; severity of tone   PT Frequency 2x / week   PT Duration  8 weeks   PT Treatment/Interventions ADLs/Self Care Home Management;Biofeedback;Canalith Repostioning;Electrical  Stimulation;Neuromuscular re-education;Balance training;Therapeutic exercise;Therapeutic activities;Manual techniques;Functional mobility training;Stair training;Orthotic Fit/Training;Gait training;DME Instruction;Patient/family education;Vestibular;Aquatic Therapy   PT Next Visit Plan Check HEP goal and modify/condense HEP as indicated. Perform BERG to ensure pt is progressing towards LTG. continue gait with RW and balance training:  core stabilization-, RLE NMR, tone management   Consulted and Agree with Plan of Care Patient;Family member/caregiver   Family Member Consulted [name]      Patient will benefit from skilled therapeutic intervention in order to improve the following deficits and impairments:  Decreased endurance, Abnormal gait, Decreased knowledge of use of DME, Decreased balance, Decreased mobility, Dizziness, Impaired flexibility, Postural dysfunction, Decreased coordination, Decreased strength, Impaired tone  Visit Diagnosis: Other abnormalities of gait and mobility  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 Hemiplegia affecting dominant side, post-stroke ([place]) 03/07/2017  . Spastic hemiplegia affecting dominant side ([place]) 02/07/2017  . Hemiparesis affecting right side as late effect of cerebrovascular accident ([place]) 12/02/2016  . Right foot drop 12/02/2016  . 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 Depression due to dementia 01/28/2015  . Dissection of vertebral artery ([place]) 01/28/2015  . 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 Hemiplegia following CVA (cerebrovascular accident) ([place]) 05/31/2014  . 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  Left pontine stroke ([place]) 05/31/2014  . MCI (mild cognitive impairment) with memory loss 05/31/2014    [name] L 04/19/2017, 10:20 AM  [facility] [address] [place], [place], [address] Phone: [phone]   Fax:  [phone]  Name: [name] MRN: [id number] Date of Birth: 07/04/1941  Geoffry Paradise, PT,DPT 04/19/17 10:21 AM Phone: [phone] Fax: [phone]

## 2017-04-21 ENCOUNTER — Ambulatory Visit: Payer: Medicare Other

## 2017-04-21 ENCOUNTER — Encounter: Payer: Medicare Other | Attending: Physical Medicine & Rehabilitation

## 2017-04-21 ENCOUNTER — Encounter: Payer: Self-pay | Admitting: Physical Medicine & Rehabilitation

## 2017-04-21 ENCOUNTER — Encounter: Payer: Medicare Other | Admitting: Occupational Therapy

## 2017-04-21 ENCOUNTER — Ambulatory Visit (HOSPITAL_BASED_OUTPATIENT_CLINIC_OR_DEPARTMENT_OTHER): Payer: Medicare Other | Admitting: Physical Medicine & Rehabilitation

## 2017-04-21 VITALS — BP 138/80 | HR 84

## 2017-04-21 DIAGNOSIS — G8111 Spastic hemiplegia affecting right dominant side: Secondary | ICD-10-CM | POA: Diagnosis not present

## 2017-04-21 DIAGNOSIS — G8110 Spastic hemiplegia affecting unspecified side: Secondary | ICD-10-CM | POA: Insufficient documentation

## 2017-04-21 DIAGNOSIS — I69353 Hemiplegia and hemiparesis following cerebral infarction affecting right non-dominant side: Secondary | ICD-10-CM

## 2017-04-21 DIAGNOSIS — R2689 Other abnormalities of gait and mobility: Secondary | ICD-10-CM

## 2017-04-21 DIAGNOSIS — R2681 Unsteadiness on feet: Secondary | ICD-10-CM

## 2017-04-21 NOTE — Therapy (Signed)
[facility] [address] [place], [place], [address] Phone: [phone]   Fax:  [phone]  Physical Therapy Treatment  Patient Details  Name: [name] MRN: [id number] Date of Birth: Jan 19, 1941 Referring Provider: Dr. Brett Fairy  Encounter Date: 04/21/2017      PT End of Session - 04/21/17 1038    Visit Number 18   Number of Visits 26   Date for PT Re-Evaluation 05/21/17   Authorization Type [employer] Medicare: G-CODE AND PROGRESS NOTE EVERY 10 VISITS.    PT Start Time [phone]  pt arrived late   PT Stop Time 0930   PT Time Calculation (min) 40 min   Equipment Utilized During Treatment --  min guard to S prn   Activity Tolerance Patient tolerated treatment well   Behavior During Therapy WFL for tasks assessed/performed      Past Medical History:  Diagnosis Date  . CKD (chronic kidney disease), stage II   . CVA (cerebral infarction)    Left thalamic, right-sided weakness wih aphagia, right leg brace  . Dyslipidemia   . Elevated PSA    biopsy negative in 2008  . GERD (gastroesophageal reflux disease)   . Hypertension    without medication since 2006  . Major depression   . Seasonal rhinitis     Past Surgical History:  Procedure Laterality Date  . NASAL SEPTUM SURGERY    . VASECTOMY      There were no vitals filed for this visit.      Subjective Assessment - 04/21/17 1038    Subjective Pt reports he's getting botox today. Pt denied falls or changes since last visit.    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 No/denies         Therex: Reviewed during session to determine proper technique and progress as tolerated. Cues and demo for  technique and to encourage to attempt knee flexion exercise again. Please see pt instructions for HEP details.                 [facility] Adult PT Treatment/Exercise - 04/21/17 0914      Standardized Balance Assessment   Standardized Balance Assessment Berg Balance Test     Berg Balance Test   Sit to Stand Able to stand without using hands and stabilize independently   Standing Unsupported Able to stand safely 2 minutes   Sitting with Back Unsupported but Feet Supported on Floor or Stool Able to sit safely and securely 2 minutes   Stand to Sit Sits safely with minimal use of hands   Transfers Able to transfer safely, minor use of hands   Standing Unsupported with Eyes Closed Able to stand 10 seconds safely   Standing Ubsupported with Feet Together Able to place feet together independently and stand 1 minute safely   From Standing, Reach Forward with Outstretched Arm Can reach forward >12 cm safely (5")   From Standing Position, Pick up Object from Floor Able to pick up shoe safely and easily   From Standing Position, Turn to Look Behind Over each Shoulder Looks behind from both sides and weight shifts well   Turn 360 Degrees Able to turn 360 degrees safely but slowly   Standing Unsupported, Alternately Place  Feet on Step/Stool Needs assistance to keep from falling or unable to try   Standing Unsupported, One Foot in Front Able to plae foot ahead of the other independently and hold 30 seconds   Standing on One Leg Tries to lift leg/unable to hold 3 seconds but remains standing independently   Total Score 45                PT Education - 04/21/17 1037    Education provided Yes   Education Details PT updated/progressed HEP. PT discussed outcome measure results and falls risk.    Person(s) Educated Patient;Child(ren)   Methods Explanation;Demonstration;Verbal cues;Handout   Comprehension Verbalized understanding;Returned demonstration          PT Short Term Goals -  04/21/17 1042      PT SHORT TERM GOAL #3   Title Pt will improve gait speed to >/= 1,4 ft/sec. with RW to safely amb. in the community.  04-21-17   Baseline gait velocity 34.5 secs = .95 ft/sec with RW on 03-22-17   Time 4   Period Weeks   Status Achieved     PT SHORT TERM GOAL #4   Title Obtain consult for new AFO for RLE after Botox injection scheduled in early August.  04-21-17   Time 4   Period Weeks   Status Deferred     PT SHORT TERM GOAL #5   Title Improve TUG score to </= 24 secs with RW for improved mobility.  04-21-17   Baseline 32.86 secs with RW   Time 4   Period Weeks   Status Partially Met     PT SHORT TERM GOAL #6   Title Independent in updated HEP including  aquatic exercises.  04-21-17   Time 4   Period Weeks   Status Partially Met           PT Long Term Goals - 03/23/17 2213      PT LONG TERM GOAL #1   Title Pt will verbalize understanding of risk factors/signs/symptoms of CVA to reduce risk of add'l CVA. TARGET DATE FOR ALL LTGS: 02/22/17   Status Achieved     PT LONG TERM GOAL #2   Title Pt will amb. 500' with RW with SBA on flat, even surface with min cues to increase RLE external rotation.  05-21-17   Time 8   Period Weeks   Status New     PT LONG TERM GOAL #3   Title Pt will report zero falls in last 4 weeks to improve safety.   Baseline daughter reports pt fell at her brother's house last week   Status Not Met     PT LONG TERM GOAL #4   Title Pt will improve BERG score to >/=44/56 to decr. falls risk.  05-21-17   Time 8   Period Weeks   Status On-going     PT LONG TERM GOAL #5   Title Obtain new AFO for RLE to minimize gait deviations.  05-21-17   Time 8   Period Weeks   Status New               Plan - 04/21/17 1039    Clinical Impression Statement Pt partially met LTG 6, as he required cues for technique and to perform knee flexion with green band to improve R hamstring strength. Pt's BERG score improved to 45/56, which continues  to indicate pt is at significant risk for falls. Pt would continue to benefit from skilled PT  to improve safety during functional mobility.    Clinical Impairments Affecting Rehab Potential see PMH, and CVA 13 years ago; severity of tone   PT Frequency 2x / week   PT Duration 8 weeks   PT Treatment/Interventions ADLs/Self Care Home Management;Biofeedback;Canalith Repostioning;Electrical Stimulation;Neuromuscular re-education;Balance training;Therapeutic exercise;Therapeutic activities;Manual techniques;Functional mobility training;Stair training;Orthotic Fit/Training;Gait training;DME Instruction;Patient/family education;Vestibular;Aquatic Therapy   PT Next Visit Plan continue gait with RW and balance training:  core stabilization-, RLE NMR, tone management   Consulted and Agree with Plan of Care Patient;Family member/caregiver   Family Member Consulted [name]      Patient will benefit from skilled therapeutic intervention in order to improve the following deficits and impairments:  Decreased endurance, Abnormal gait, Decreased knowledge of use of DME, Decreased balance, Decreased mobility, Dizziness, Impaired flexibility, Postural dysfunction, Decreased coordination, Decreased strength, Impaired tone  Visit Diagnosis: 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 Hemiplegia affecting dominant side, post-stroke ([facility]) 03/07/2017  . Spastic hemiplegia affecting dominant side ([facility]) 02/07/2017  . Hemiparesis affecting right side as late effect of cerebrovascular accident ([place]) 12/02/2016  . Right foot drop 12/02/2016  . 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 Depression due to dementia 01/28/2015  . Dissection of vertebral artery ([place]) 01/28/2015  . Amnestic MCI (mild cognitive impairment with  memory loss) 01/28/2015  . Hemiplegia following CVA (cerebrovascular accident) ([place]) 05/31/2014  . 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 Left pontine stroke ([place]) 05/31/2014  . MCI (mild cognitive impairment) with memory loss 05/31/2014    Rosaland Shiffman L 04/21/2017, 10:44 AM  [facility] [address] [place], [place], [address] Phone: [phone]   Fax:  [phone]  Name: [name] MRN: [id number] Date of Birth: 12/25/1940  Geoffry Paradise, PT,DPT 04/21/17 10:45 AM Phone: [phone] Fax: [phone]

## 2017-04-21 NOTE — Progress Notes (Signed)
Botox Injection for spasticity using needle EMG guidance  Dilution: 50 Units/ml Indication: Severe spasticity which interferes with ADL,mobility and/or  hygiene and is unresponsive to medication management and other conservative care Informed consent was obtained after describing risks and benefits of the procedure with the patient. This includes bleeding, bruising, infection, excessive weakness, or medication side effects. A REMS form is on file and signed. Needle: 50mm 25 g needle electrode Number of units per muscle RIGHT VMO 25 Vast Lat 25 Rectus Fem 25 Vast Int 25 All injections were done after obtaining appropriate EMG activity and after negative drawback for blood. The patient tolerated the procedure well. Post procedure instructions were given. A followup appointment was made.

## 2017-04-21 NOTE — Patient Instructions (Signed)

## 2017-04-21 NOTE — Patient Instructions (Addendum)
HIP: Hamstrings - Short Sitting    Rest leg on raised surface. Keep knee straight. Lift chest. Hold _30__ seconds. _3__ reps per set, _2-3__ sets per day, _7__ days per week  Copyright  VHI. All rights reserved.   Lower Trunk Rotation Stretch    Keeping back flat and feet together, rotate knees to left side. Hold __30__ seconds. Then rotate knees to right side and hold 30 seconds. Repeat __3__ times per set. Do __1__ sets per session. Do _2-3___ sessions per day.  http://orth.exer.us/122   Copyright  VHI. All rights reserved.   Piriformis (Supine)    Cross legs, right on top.  Push top knee away from body (gentle stretch). Hold __30__ seconds. Switch and repeat with other leg. Repeat __3_ times per set. Do __1__ sets per session. Do _2-3___ sessions per day. You can also perform in seated position.   http://orth.exer.us/676   Progression: Gently pull other knee toward chest until stretch is felt in buttock/hip of top leg.  Copyright  VHI. All rights reserved.   Toe / Heel Raise (Sitting)    Sitting, raise heels, then rock back on heels and raise toes. Repeat __10__ times. Take breaks to make sure form is correct.   Copyright  VHI. All rights reserved.   Ankle Sideward Movement (Inversion / Eversion)    Tie green band around balls of feet. Sitting with feet on floor, keep knee still and bring foot out to the side. Return to resting position.  Repeat __10__ times. Do __3__ sets per session. Perform 3 days a week.  http://gt2.exer.us/405   Copyright  VHI. All rights reserved. Marland Kitchen.     Heel Raise: Bilateral (Standing)    Rise on balls of feet. Repeat __5-10__ times per set. Do __3__ sets per session. Do _3__ sessions per week.  HOLD UP 3 secs  http://orth.exer.us/38    Copyright  VHI. All rights reserved.   Calf / Gastoc: Runners' Stretch I    One leg back and straight, other forward and bent supporting weight, lean forward, gently  stretching calf of back leg. Hold __45__ seconds. Repeat with other leg. Repeat __3__ times. Do __2-3__ sessions per day.  Copyright  VHI. All rights reserved.    Knee Flexion: Sitting (Single Leg)    Sit facing anchor, leg extended. Tubing looped around ankle, flex knee, pulling back. Repeat _10_ times per set.  Do _2_ sets per session. Perform 3 days per week. USE GREEN BAND Anchor Height: Knee  http://tub.exer.us/53   Copyright  VHI. All rights reserved.    **Reviewed during session to ensure proper technique for all exercises.

## 2017-04-26 ENCOUNTER — Ambulatory Visit: Payer: Medicare Other | Admitting: Physical Therapy

## 2017-04-26 DIAGNOSIS — I69353 Hemiplegia and hemiparesis following cerebral infarction affecting right non-dominant side: Secondary | ICD-10-CM

## 2017-04-26 DIAGNOSIS — R293 Abnormal posture: Secondary | ICD-10-CM

## 2017-04-27 NOTE — Therapy (Signed)
[facility] [address] [place], [place], [address] Phone: [phone]   Fax:  [phone]  Physical Therapy Treatment  Patient Details  Name: [name] MRN: [id number] Date of Birth: December 10, 1940 Referring Provider: Dr. Brett Fairy  Encounter Date: 04/26/2017    Past Medical History:  Diagnosis Date  . CKD (chronic kidney disease), stage II   . CVA (cerebral infarction)    Left thalamic, right-sided weakness wih aphagia, right leg brace  . Dyslipidemia   . Elevated PSA    biopsy negative in 2008  . GERD (gastroesophageal reflux disease)   . Hypertension    without medication since 2006  . Major depression   . Seasonal rhinitis     Past Surgical History:  Procedure Laterality Date  . NASAL SEPTUM SURGERY    . VASECTOMY      There were no vitals filed for this visit.      Subjective Assessment - 04/27/17 2263    Subjective Received Botox injection last week on 04/21/17 to R quads and daughter feels that she notices less stiffness overall.   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 No/denies                         [facility] Adult PT Treatment/Exercise - 04/27/17 0825      Ambulation/Gait   Ambulation/Gait Yes   Ambulation/Gait Assistance 5: Supervision;4: Min assist  Min assist (with bilateral HHA therapist in front of pt)   Ambulation/Gait Assistance Details Cues for increased R hip/knee flexion, stance time to allow for improved L step length, improved R heelstrike.  Manual cues provided at R hip to decrease RLE internal rotation.   Ambulation Distance (Feet) 120 Feet  with RW, then 80 ft with RW; 120 ft with bilat HHA   Assistive device Rolling walker  therapist in front with HHA   Gait Pattern Step-through pattern;Decreased stride length;Decreased arm swing - right;Decreased dorsiflexion - right;Decreased hip/knee flexion - right;Right hip hike;Decreased trunk rotation   Ambulation Surface Level;Indoor   Gait Comments Pt wearing AFO     Knee/Hip Exercises: Stretches   Passive Hamstring Stretch Right;4 reps;30 seconds  contract relax x 3; pt in supine with hip flexed at 90   Passive Hamstring Stretch Limitations initial hamstrings ROM30 degrees from neutral; after stretch:  21 degrees from full knee extension     Knee/Hip Exercises: Seated   Long Arc Quad AROM;Right;1 set;10 reps   Heel Slides AROM;Strengthening;Right;1 set;15 reps  Cues for "digging in" R heel to improve dflex activation     Knee/Hip Exercises: Supine   Heel Slides AROM;Strengthening;Right;1 set;15 reps   Heel Slides Limitations Manual and verbal cues provided for neutral foot position   Knee Flexion AROM;Strengthening;Right;1 set;15 reps  Hooklying marching, cues for R foot position   Other Supine Knee/Hip Exercises Legs over red therapy ball:  hip/knee flexion 2 sets x 10 reps, then trunk rotation x 10 reps     Assessed RLE tone in sitting edge of mat:  Mild increase in tone noted with quick P/ROM into knee flexion             PT Short Term Goals - 04/21/17 1042  PT SHORT TERM GOAL #3   Title Pt will improve gait speed to >/= 1,4 ft/sec. with RW to safely amb. in the community.  04-21-17   Baseline gait velocity 34.5 secs = .95 ft/sec with RW on 03-22-17   Time 4   Period Weeks   Status Achieved     PT SHORT TERM GOAL #4   Title Obtain consult for new AFO for RLE after Botox injection scheduled in early August.  04-21-17   Time 4   Period Weeks   Status Deferred     PT SHORT TERM GOAL #5   Title Improve TUG score to </= 24 secs with RW for improved mobility.  04-21-17   Baseline 32.86 secs with RW   Time 4    Period Weeks   Status Partially Met     PT SHORT TERM GOAL #6   Title Independent in updated HEP including  aquatic exercises.  04-21-17   Time 4   Period Weeks   Status Partially Met           PT Long Term Goals - 03/23/17 2213      PT LONG TERM GOAL #1   Title Pt will verbalize understanding of risk factors/signs/symptoms of CVA to reduce risk of add'l CVA. TARGET DATE FOR ALL LTGS: 02/22/17   Status Achieved     PT LONG TERM GOAL #2   Title Pt will amb. 500' with RW with SBA on flat, even surface with min cues to increase RLE external rotation.  05-21-17   Time 8   Period Weeks   Status New     PT LONG TERM GOAL #3   Title Pt will report zero falls in last 4 weeks to improve safety.   Baseline daughter reports pt fell at her brother's house last week   Status Not Met     PT LONG TERM GOAL #4   Title Pt will improve BERG score to >/=44/56 to decr. falls risk.  05-21-17   Time 8   Period Weeks   Status On-going     PT LONG TERM GOAL #5   Title Obtain new AFO for RLE to minimize gait deviations.  05-21-17   Time 8   Period Weeks   Status New               Plan - 04/27/17 1214    Clinical Impression Statement Pt seen today, with focus on stretching, active ROM and strengthening activities, given patient had Botox injections to R quads last week.  Pt appears to have decreased stiffness and extensor tone with ROM and strengthening activities.  With gait, pt continues to need tactile and verbal cues for R LE placement; however, overall with gait, pt noted to have decreased internal rotation after stretching and exercises in session today.   Clinical Impairments Affecting Rehab Potential see PMH, and CVA 13 years ago; severity of tone   PT Frequency 2x / week   PT Duration 8 weeks   PT Treatment/Interventions ADLs/Self Care Home Management;Biofeedback;Canalith Repostioning;Electrical Stimulation;Neuromuscular re-education;Balance training;Therapeutic  exercise;Therapeutic activities;Manual techniques;Functional mobility training;Stair training;Orthotic Fit/Training;Gait training;DME Instruction;Patient/family education;Vestibular;Aquatic Therapy   PT Next Visit Plan continue gait with RW and balance training:  core stabilization-, RLE NMR, tone management-especially RLE stretching, strengthening following Botox injection 04/21/17   Consulted and Agree with Plan of Care Patient;Family member/caregiver   Family Member Consulted [name]      Patient will benefit from skilled therapeutic intervention in order to improve the  following deficits and impairments:  Decreased endurance, Abnormal gait, Decreased knowledge of use of DME, Decreased balance, Decreased mobility, Dizziness, Impaired flexibility, Postural dysfunction, Decreased coordination, Decreased strength, Impaired tone  Visit Diagnosis: Abnormal posture  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 Hemiplegia affecting dominant side, post-stroke ([place]) 03/07/2017  . Spastic hemiplegia affecting dominant side ([place]) 02/07/2017  . Hemiparesis affecting right side as late effect of cerebrovascular accident ([place]) 12/02/2016  . Right foot drop 12/02/2016  . 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 Depression due to dementia 01/28/2015  . Dissection of vertebral artery ([place]) 01/28/2015  . 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 Hemiplegia following CVA (cerebrovascular accident) ([place]) 05/31/2014  . 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 Left pontine stroke ([place]) 05/31/2014  . MCI (mild cognitive impairment) with memory loss 05/31/2014    Jediah Horger W. 04/27/2017, 12:19 PM  Frazier Butt., PT   [facility] [address] [place], [place], [address] Phone: [phone]   Fax:  [phone]  Name: [name] MRN: [id number] Date of Birth: 04/08/1941

## 2017-04-28 ENCOUNTER — Ambulatory Visit: Payer: Medicare Other | Admitting: Physical Therapy

## 2017-04-28 DIAGNOSIS — I69353 Hemiplegia and hemiparesis following cerebral infarction affecting right non-dominant side: Secondary | ICD-10-CM

## 2017-04-28 DIAGNOSIS — R2689 Other abnormalities of gait and mobility: Secondary | ICD-10-CM

## 2017-04-29 NOTE — Therapy (Signed)
[facility] [address] [place], [place], [address] Phone: [phone]   Fax:  [phone]  Physical Therapy Treatment  Patient Details  Name: [name] MRN: [id number] Date of Birth: 02/21/1941 Referring Provider: Dr. Brett Graham  Encounter Date: 04/28/2017      PT End of Session - 04/29/17 1356    Visit Number 19   Number of Visits 26   Date for PT Re-Evaluation 05/21/17   Authorization Type UHC Medicare: G-CODE AND PROGRESS NOTE EVERY 10 VISITS.    PT Start Time [phone]   PT Stop Time 0932   PT Time Calculation (min) 45 min      Past Medical History:  Diagnosis Date  . CKD (chronic kidney disease), stage II   . CVA (cerebral infarction)    Left thalamic, right-sided weakness wih aphagia, right leg brace  . Dyslipidemia   . Elevated PSA    biopsy negative in 2008  . GERD (gastroesophageal reflux disease)   . Hypertension    without medication since 2006  . Major depression   . Seasonal rhinitis     Past Surgical History:  Procedure Laterality Date  . NASAL SEPTUM SURGERY    . VASECTOMY      There were no vitals filed for this visit.      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 No/denies                         [facility] Adult PT Treatment/Exercise - 04/29/17 0001      Ambulation/Gait   Ambulation/Gait Yes   Ambulation/Gait Assistance 4: Min guard   Ambulation/Gait Assistance Details cues to externally rotate RLE and to  flex Rt hip and knee in swing phse of gait   Ambulation Distance (Feet) 100 Feet   Assistive device 1 person hand held assist  pt hands on PT's shoulders   Gait Pattern Step-through pattern;Decreased stride length;Decreased arm swing - right;Decreased dorsiflexion - right;Decreased hip/knee flexion - right;Right hip hike;Decreased trunk rotation   Ambulation Surface Level;Indoor   Gait Comments Blue rocker AFO used for approx. 100':  trialed Ottobock reaction and toe off AFO on RLE - Rt foot has more supination with these 2 AFO's than with Blue Rocker      TherEx; PROM and stretching for RLE - hamstring and heel cord stretching; Rt piriformis stretch 30 sec hold Bridging x 5 reps:  R 1/2 bridging x 5 reps:  Bridging with LLE extension x 5 reps R knee to chest with Rt foot dorsiflexed x 10 reps  Tall kneeling with partial squats for Rt hip strengthening and for Rt quad stretching x 10 reps Quadruped - lifting RLE back x 5 reps             PT Short Term Goals - 04/21/17 1042      PT SHORT TERM GOAL #3   Title Pt will improve gait speed to >/= 1,4 ft/sec. with RW to safely amb. in  the community.  04-21-17   Baseline gait velocity 34.5 secs = .95 ft/sec with RW on 03-22-17   Time 4   Period Weeks   Status Achieved     PT SHORT TERM GOAL #4   Title Obtain consult for new AFO for RLE after Botox injection scheduled in early August.  04-21-17   Time 4   Period Weeks   Status Deferred     PT SHORT TERM GOAL #5   Title Improve TUG score to </= 24 secs with RW for improved mobility.  04-21-17   Baseline 32.86 secs with RW   Time 4   Period Weeks   Status Partially Met     PT SHORT TERM GOAL #6   Title Independent in updated HEP including  aquatic exercises.  04-21-17   Time 4   Period Weeks   Status Partially Met           PT Long Term Goals - 03/23/17 2213      PT LONG TERM GOAL #1   Title Pt will verbalize understanding of risk factors/signs/symptoms of CVA to reduce  risk of add'l CVA. TARGET DATE FOR ALL LTGS: 02/22/17   Status Achieved     PT LONG TERM GOAL #2   Title Pt will amb. 500' with RW with SBA on flat, even surface with min cues to increase RLE external rotation.  05-21-17   Time 8   Period Weeks   Status New     PT LONG TERM GOAL #3   Title Pt will report zero falls in last 4 weeks to improve safety.   Baseline daughter reports pt fell at her brother's house last week   Status Not Met     PT LONG TERM GOAL #4   Title Pt will improve BERG score to >/=44/56 to decr. falls risk.  05-21-17   Time 8   Period Weeks   Status On-going     PT LONG TERM GOAL #5   Title Obtain new AFO for RLE to minimize gait deviations.  05-21-17   Time 8   Period Weeks   Status New               Plan - 04/29/17 1406    Clinical Impression Statement Slight decrease in spasticity noted in RLE with PROM and stretching exercises but no significant changes in gait pattern noted at this time with pt continuing to have Rt hip internal rotation and decreased Rt hip and knee flexion in swing phase of gait    Rehab Potential Good   Clinical Impairments Affecting Rehab Potential see PMH, and CVA 13 years ago; severity of tone   PT Frequency 2x / week   PT Duration 8 weeks   PT Treatment/Interventions ADLs/Self Care Home Management;Biofeedback;Canalith Repostioning;Electrical Stimulation;Neuromuscular re-education;Balance training;Therapeutic exercise;Therapeutic activities;Manual techniques;Functional mobility training;Stair training;Orthotic Fit/Training;Gait training;DME Instruction;Patient/family education;Vestibular;Aquatic Therapy   PT Next Visit Plan continue gait with RW and balance training:  core stabilization-, RLE NMR, tone management-especially RLE stretching, strengthening following Botox injection 04/21/17   Consulted and Agree with Plan of Care Patient;Family member/caregiver   Family Member Consulted dtr: [name]      Patient will benefit from  skilled therapeutic intervention in order to improve the following deficits and impairments:  Decreased endurance, Abnormal gait, Decreased knowledge of use of DME, Decreased balance, Decreased mobility, Dizziness, Impaired flexibility, Postural dysfunction, Decreased coordination, Decreased strength, Impaired tone  Visit Diagnosis: Other abnormalities of gait and mobility  Spastic hemiplegia of  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 Hemiplegia affecting dominant side, post-stroke ([place]) 03/07/2017  . Spastic hemiplegia affecting dominant side ([place]) 02/07/2017  . Hemiparesis affecting right side as late effect of cerebrovascular accident ([place]) 12/02/2016  . Right foot drop 12/02/2016  . 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 Depression due to dementia 01/28/2015  . Dissection of vertebral artery ([place]) 01/28/2015  . 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 Hemiplegia following CVA (cerebrovascular accident) ([place]) 05/31/2014  . 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 Left pontine stroke ([place]) 05/31/2014  . MCI (mild cognitive impairment) with memory loss 05/31/2014    Ailish Prospero, [place], PT 04/29/2017, 2:14 PM  [facility] [address] [place], [place], [address] Phone: [phone]   Fax:  [phone]  Name: [name] MRN: [id number] Date of Birth: [birth year]

## 2017-05-03 ENCOUNTER — Ambulatory Visit: Payer: Medicare Other | Admitting: Physical Therapy

## 2017-05-03 DIAGNOSIS — R2689 Other abnormalities of gait and mobility: Secondary | ICD-10-CM

## 2017-05-03 DIAGNOSIS — I69353 Hemiplegia and hemiparesis following cerebral infarction affecting right non-dominant side: Secondary | ICD-10-CM | POA: Diagnosis not present

## 2017-05-04 NOTE — Progress Notes (Signed)
I agree with the assessment and plan as directed by PT  [facility] HospitalDOHMEIER,Idora Brosious, MD

## 2017-05-04 NOTE — Therapy (Signed)
[facility] [address] [place], [place], [address] Phone: [phone]   Fax:  [phone]  Physical Therapy Treatment  Patient Details  Name: [name] MRN: [id number] Date of Birth: April 26, 1941 Referring Provider: Dr. Brett Fairy  Encounter Date: 05/03/2017      PT End of Session - 05/04/17 1041    Visit Number 20   Number of Visits 26   Date for PT Re-Evaluation 05/21/17   Authorization Type UHC Medicare: G-CODE AND PROGRESS NOTE EVERY 10 VISITS.    PT Start Time 1149   PT Stop Time 1250   PT Time Calculation (Dennis) 61 Dennis      Past Medical History:  Diagnosis Date  . CKD (chronic kidney disease), stage II   . CVA (cerebral infarction)    Left thalamic, right-sided weakness wih aphagia, right leg brace  . Dyslipidemia   . Elevated PSA    biopsy negative in 2008  . GERD (gastroesophageal reflux disease)   . Hypertension    without medication since 2006  . Major depression   . Seasonal rhinitis     Past Surgical History:  Procedure Laterality Date  . NASAL SEPTUM SURGERY    . VASECTOMY      There were no vitals filed for this visit.      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 No/denies                         [facility] Adult PT Treatment/Exercise - 05/04/17 0001      Ambulation/Gait   Ambulation/Gait Yes   Ambulation/Gait Assistance 4: Dennis guard   Ambulation/Gait Assistance Details cues to flex Rt knee and exteranlly rotate hip; AFO trial iwith [name], orthotist from [place], present for assessment of possible new AFO:  blue rocker trialed - no significant change in gait compared with use of pt's current articulating AFO   Ambulation Distance (Feet) 35 Feet  4 reps for gait analysis   Assistive device Rolling walker   Gait Pattern Step-through pattern;Decreased stride length;Decreased arm swing - right;Decreased dorsiflexion - right;Decreased hip/knee flexion - right;Right hip hike;Decreased trunk rotation   Ambulation Surface Level;Indoor     Knee/Hip Exercises: Supine   Heel Slides AROM;Strengthening;Right;1 set;10 reps   Heel Slides Limitations Manual and verbal cues provided for neutral foot position   Bridges with Clamshell AROM;1 set;10 reps   Other Supine Knee/Hip Exercises bridging with LLE extension x 5 reps     Knee/Hip Exercises: Prone   Hamstring Curl 1 set;10 reps   Hip Extension AAROM;1 set;Right;10 reps  knee flexed at 90 degrees      Rt knee flexion facilitation exercise - pt sat in rolling chair and used RLE to propel himself forward while seated in chair - Dennis to mod assist Need for propulsion - approx. 25' x 2 reps  AFO consult with [name] from [place]; Blue rocker AFO donned on RLE for gait assessment - no significant change in gait noted with use of  this AFO  Compared to pt's current AFO with articulating ankle joint Ottobock Reaction does not provide enough stability to prevent Rt ankle supination due to his increased extensor tone  [place] brace was ruled out due to this brace immobilizing pt's ankle to prevent supination - daughter and pt declined this brace as pt wishes to be able to move  Rt foot as able;  It was decided to keep current custom AFO at this time with daughter stating they would continue to do exercises to decrease gait deviaitons   Tall kneeling position - weight shifting laterally; moving LLE up/back for RLE stabilization with Dennis assist provided for balance            PT  Short Term Goals - 04/21/17 1042      PT SHORT TERM GOAL #3   Title Pt will improve gait speed to >/= 1,4 ft/sec. with RW to safely amb. in the community.  04-21-17   Baseline gait velocity 34.5 secs = .95 ft/sec with RW on 03-22-17   Time 4   Period Weeks   Status Achieved     PT SHORT TERM GOAL #4   Title Obtain consult for new AFO for RLE after Botox injection scheduled in early August.  04-21-17   Time 4   Period Weeks   Status Deferred     PT SHORT TERM GOAL #5   Title Improve TUG score to </= 24 secs with RW for improved mobility.  04-21-17   Baseline 32.86 secs with RW   Time 4   Period Weeks   Status Partially Met     PT SHORT TERM GOAL #6   Title Independent in updated HEP including  aquatic exercises.  04-21-17   Time 4   Period Weeks   Status Partially Met           PT Long Term Goals - 05/04/17 1109      PT LONG TERM GOAL #1   Title Pt will verbalize understanding of risk factors/signs/symptoms of CVA to reduce risk of add'l CVA. TARGET DATE FOR ALL LTGS: 02/22/17   Status Achieved     PT LONG TERM GOAL #2   Title Pt will amb. 500' with RW with SBA on flat, even surface with Dennis cues to increase RLE external rotation.  05-21-17   Status New     PT LONG TERM GOAL #3   Title Pt will report zero falls in last 4 weeks to improve safety.   Baseline daughter reports pt fell at her brother's house last week   Status Not Met     PT LONG TERM GOAL #4   Title Pt will improve BERG score to >/=44/56 to decr. falls risk.  05-21-17   Status On-going     PT LONG TERM GOAL #5   Title Obtain new AFO for RLE to minimize gait deviations.  05-21-17   Baseline consult with [name] from [place] completed on 05-03-17:  no significant benefit of another AFO will affect Rt hip internal rotation and his current brace is preventing Rt ankle supination   Status Deferred               Plan - 05/04/17 1042    Clinical Impression Statement No signficant change in tone noted  in RLE since Botox injection on 04-21-17; no significant benefit of use of different AFO's such as Ottobock Reaction or Blue rocker noted due to pt's problem is increased Rt hip internal rotation and  not significant foot drop on RLE; [name], orthotist from [place] present for consult and stated that his current AFO is sufficient due to no AFO affecting the internal rotation of Rt hip; [place] brace ruled out due to immobility of ankle with this brace donned; pt and wife request to keep working with exercises to minimize gait deviaitons; pt is now able to wear smaller sized shoe on RLE as insert was removed from this shoe , allowing more room for current AFO   Clinical Impairments Affecting Rehab Potential see PMH, and CVA 13 years ago; severity of tone   PT Frequency 2x / week   PT Duration 8 weeks   PT Treatment/Interventions ADLs/Self Care Home Management;Biofeedback;Canalith Repostioning;Electrical Stimulation;Neuromuscular re-education;Balance training;Therapeutic exercise;Therapeutic activities;Manual techniques;Functional mobility training;Stair training;Orthotic Fit/Training;Gait training;DME Instruction;Patient/family education;Vestibular;Aquatic Therapy   PT Next Visit Plan continue gait with RW and balance training:  core stabilization-, RLE NMR, tone management-especially RLE stretching, strengthening following Botox injection 04/21/17   Consulted and Agree with Plan of Care Patient;Family member/caregiver   Family Member Consulted [name]      Patient will benefit from skilled therapeutic intervention in order to improve the following deficits and impairments:  Decreased endurance, Abnormal gait, Decreased knowledge of use of DME, Decreased balance, Decreased mobility, Dizziness, Impaired flexibility, Postural dysfunction, Decreased coordination, Decreased strength, Impaired tone  Visit Diagnosis: Other abnormalities of gait and mobility  Spastic hemiplegia of right nondominant side  as late effect of cerebral infarction ([place])       G-Codes - 05/27/2017 1051    Functional Assessment Tool Used (Outpatient Only) pt continues to require use of RW for assistance with ambulation   Mobility: Walking and Moving Around Current Status (D6644) At least 60 percent but less than 80 percent impaired, limited or restricted   Mobility: Walking and Moving Around Goal Status [phone]) At least 40 percent but less than 60 percent impaired, limited or restricted      Problem List Patient Active Problem List   Diagnosis Date Noted  . Hemiplegia affecting dominant side, post-stroke ([place]) 03/07/2017  . Spastic hemiplegia affecting dominant side ([place]) 02/07/2017  . Hemiparesis affecting right side as late effect of cerebrovascular accident ([place]) 12/02/2016  . Right foot drop 12/02/2016  . 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 Depression due to dementia 01/28/2015  . Dissection of vertebral artery ([place]) 01/28/2015  . 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 Hemiplegia following CVA (cerebrovascular accident) ([place]) 05/31/2014  . 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 Left pontine stroke ([place]) 05/31/2014  . MCI (mild cognitive impairment) with memory loss 05/31/2014    Physical Therapy Progress Note  Dates of Reporting Period:  03-29-17 to 05/27/2017  Objective Reports of Subjective Statement: Pt continues to require use of RW for assistance with ambulation; pt and daughter state that RLE is not as stiff since Botox injection received on 04-21-17  Objective Measurements: TUG score 32.86 secs with RW with gait deviations due to increased tone in RLE  Goal Update:  See above for progress towards goals  Plan: see above for PT treatment/interventions;  AFO consult completed on 27-May-2017  Reason Skilled Services are Required: Continued decreased balance and gait deviaitons due to increased tone/spasticity:  Rt  hemiparesis with decreased strength and decr. Functional use of RUE and RLE Plan to D/C from PT in 2 weeks due to plateau in maximizing functional progress at this time   Alda Lea, PT 05/04/2017, 11:14  AM  [facility] [address] [place], [place], [address] Phone: [phone]   Fax:  [phone]  Name: [name] MRN: [id number] Date of Birth: [birth year]

## 2017-05-05 ENCOUNTER — Ambulatory Visit: Payer: Medicare Other | Admitting: Physical Therapy

## 2017-05-05 DIAGNOSIS — R2689 Other abnormalities of gait and mobility: Secondary | ICD-10-CM

## 2017-05-05 DIAGNOSIS — I69353 Hemiplegia and hemiparesis following cerebral infarction affecting right non-dominant side: Secondary | ICD-10-CM

## 2017-05-06 NOTE — Therapy (Signed)
[facility] [address] [place], [place], [address] Phone: [phone]   Fax:  [phone]  Physical Therapy Treatment  Patient Details  Name: [name] MRN: [id number] Date of Birth: 1940/09/14 Referring Provider: Dr. Brett Fairy  Encounter Date: 05/05/2017      PT End of Session - 05/06/17 1049    Visit Number 21   Number of Visits 26   Date for PT Re-Evaluation 05/21/17   Authorization Type UHC Medicare: G-CODE AND PROGRESS NOTE EVERY 10 VISITS.    PT Start Time 1017   PT Stop Time 1100   PT Time Calculation (min) 43 min      Past Medical History:  Diagnosis Date  . CKD (chronic kidney disease), stage II   . CVA (cerebral infarction)    Left thalamic, right-sided weakness wih aphagia, right leg brace  . Dyslipidemia   . Elevated PSA    biopsy negative in 2008  . GERD (gastroesophageal reflux disease)   . Hypertension    without medication since 2006  . Major depression   . Seasonal rhinitis     Past Surgical History:  Procedure Laterality Date  . NASAL SEPTUM SURGERY    . VASECTOMY      There were no vitals filed for this visit.      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 No/denies                         [facility] Adult PT Treatment/Exercise - 05/06/17 0001      Ambulation/Gait   Ambulation/Gait Yes   Ambulation/Gait Assistance 4: Min  guard   Ambulation/Gait Assistance Details cues to flex hip and knee on RLE and to place entire foot flat prior to stepping with LLE   Ambulation Distance (Feet) 100 Feet   Assistive device 1 person hand held assist   Gait Pattern Step-through pattern;Decreased stride length;Decreased arm swing - right;Decreased dorsiflexion - right;Decreased hip/knee flexion - right;Right hip hike;Decreased trunk rotation   Ambulation Surface Level;Indoor     Knee/Hip Exercises: Stretches   Passive Hamstring Stretch Right;2 reps;30 seconds   Other Knee/Hip Stretches Rt heel cord stretch with hamstring stretch     Ankle Exercises: Seated   Other Seated Ankle Exercises AAROM for Rt ankle inversion/eversion x 10 reps with Rt knee extended with Rt foot dorsiflexed      NeuroRe-ed: worked on standing balance with shoes doffed - mod cues for weight shifting onto RLE to maintain Rt ankle in Neutral position; pt performed stepping up/back and laterally with LLE for increased weight shifting onto RLE with min to mod assist  Lifting LLE (slow hip and knee flexion) for Rt SLS with ankle in neutral position  Trialed air cast on RLE to assess benefit of this orthosis rather than AFO - air cast not sturdy enough to prevent supination of Rt foot due to incr. Tone/spasticity  PT Short Term Goals - 04/21/17 1042      PT SHORT TERM GOAL #3   Title Pt will improve gait speed to >/= 1,4 ft/sec. with RW to safely amb. in the community.  04-21-17   Baseline gait velocity 34.5 secs = .95 ft/sec with RW on 03-22-17   Time 4   Period Weeks   Status Achieved     PT SHORT TERM GOAL #4   Title Obtain consult for new AFO for RLE after Botox injection scheduled in early August.  04-21-17   Time 4   Period Weeks   Status Deferred     PT SHORT TERM GOAL #5   Title Improve TUG score to </= 24 secs with RW for improved mobility.  04-21-17   Baseline 32.86 secs with RW   Time 4   Period Weeks   Status  Partially Met     PT SHORT TERM GOAL #6   Title Independent in updated HEP including  aquatic exercises.  04-21-17   Time 4   Period Weeks   Status Partially Met           PT Long Term Goals - 05/06/17 1054      PT LONG TERM GOAL #2   Title Pt will amb. 500' with RW with SBA on flat, even surface with min cues to increase RLE external rotation.  05-21-17   Target Date 05/21/17     PT LONG TERM GOAL #3   Title Pt will report zero falls in last 4 weeks to improve safety.   Status Not Met     PT LONG TERM GOAL #4   Title Pt will improve BERG score to >/=44/56 to decr. falls risk.  05-21-17   Status On-going   Target Date 05/21/17     PT LONG TERM GOAL #5   Title Obtain new AFO for RLE to minimize gait deviations.  05-21-17   Baseline consult with [name] from [place] completed on 05-03-17:  no significant benefit of another AFO will affect Rt hip internal rotation and his current brace is preventing Rt ankle supination   Status Deferred               Plan - 05/06/17 1049    Clinical Impression Statement Pt continues to have gait deviations due to increased tone which occurs in weight bearing position; pt able to actively isoloate Rt knee flexion and Rt ankle inversion/eversion in seated positon but unable do so in standing position due to increased tone; trialed use of air cast on RLE to maintain neutral positon of Rt ankle during gait but this orthotic not sturdy enough as tone overpowers and Rt ankle supinates in swing phase of gait                                                                                                 Rehab Potential Good   Clinical Impairments Affecting Rehab Potential see PMH, and CVA 13 years ago; severity of tone   PT Frequency 2x / week   PT Duration 8 weeks   PT  Treatment/Interventions ADLs/Self Care Home Management;Biofeedback;Canalith Repostioning;Electrical Stimulation;Neuromuscular re-education;Balance training;Therapeutic  exercise;Therapeutic activities;Manual techniques;Functional mobility training;Stair training;Orthotic Fit/Training;Gait training;DME Instruction;Patient/family education;Vestibular;Aquatic Therapy   PT Next Visit Plan continue gait with RW and balance training:  core stabilization-, RLE NMR, tone management-especially RLE stretching, strengthening following Botox injection 04/21/17   Consulted and Agree with Plan of Care Patient;Family member/caregiver   Family Member Consulted [name]      Patient will benefit from skilled therapeutic intervention in order to improve the following deficits and impairments:  Decreased endurance, Abnormal gait, Decreased knowledge of use of DME, Decreased balance, Decreased mobility, Dizziness, Impaired flexibility, Postural dysfunction, Decreased coordination, Decreased strength, Impaired tone  Visit Diagnosis: Other abnormalities of gait and mobility  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 Hemiplegia affecting dominant side, post-stroke ([place]) 03/07/2017  . Spastic hemiplegia affecting dominant side ([place]) 02/07/2017  . Hemiparesis affecting right side as late effect of cerebrovascular accident ([place]) 12/02/2016  . Right foot drop 12/02/2016  . 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 Depression due to dementia 01/28/2015  . Dissection of vertebral artery ([place]) 01/28/2015  . 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 Hemiplegia following CVA (cerebrovascular accident) ([place]) 05/31/2014  . 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 Left pontine stroke ([place]) 05/31/2014  . MCI (mild cognitive impairment) with memory loss 05/31/2014    Kanye Depree, [place], PT 05/06/2017, 10:55 AM  [facility] [address] [place], [place], [address] Phone: [phone]   Fax:  [phone]  Name: Teandre Hamre MRN: [id number] Date of Birth: 1941/06/02

## 2017-05-10 ENCOUNTER — Ambulatory Visit: Payer: Medicare Other | Attending: Neurology | Admitting: Physical Therapy

## 2017-05-10 DIAGNOSIS — R2689 Other abnormalities of gait and mobility: Secondary | ICD-10-CM | POA: Insufficient documentation

## 2017-05-10 DIAGNOSIS — R293 Abnormal posture: Secondary | ICD-10-CM | POA: Diagnosis present

## 2017-05-10 DIAGNOSIS — R2681 Unsteadiness on feet: Secondary | ICD-10-CM | POA: Insufficient documentation

## 2017-05-10 DIAGNOSIS — I69353 Hemiplegia and hemiparesis following cerebral infarction affecting right non-dominant side: Secondary | ICD-10-CM | POA: Diagnosis present

## 2017-05-11 NOTE — Therapy (Signed)
[facility] [address] [place], [place], [address] Phone: [phone]   Fax:  [phone]  Physical Therapy Treatment  Patient Details  Name: [name] MRN: [id number] Date of Birth: March 10, 1941 Referring Provider: Dr. Brett Fairy  Encounter Date: 05/10/2017      PT End of Session - 05/11/17 1013    Visit Number 22   Number of Visits 26   Date for PT Re-Evaluation 05/21/17   Authorization Type UHC Medicare: G-CODE AND PROGRESS NOTE EVERY 10 VISITS.    PT Start Time 1103   PT Stop Time 1150   PT Time Calculation (min) 47 min      Past Medical History:  Diagnosis Date  . CKD (chronic kidney disease), stage II   . CVA (cerebral infarction)    Left thalamic, right-sided weakness wih aphagia, right leg brace  . Dyslipidemia   . Elevated PSA    biopsy negative in 2008  . GERD (gastroesophageal reflux disease)   . Hypertension    without medication since 2006  . Major depression   . Seasonal rhinitis     Past Surgical History:  Procedure Laterality Date  . NASAL SEPTUM SURGERY    . VASECTOMY      There were no vitals filed for this visit.      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 No/denies                         [facility] Adult PT Treatment/Exercise - 05/11/17 0001      Ambulation/Gait   Ambulation/Gait Yes   Ambulation/Gait Assistance 4: Min guard   Ambulation/Gait Assistance Details cues to flex Rt hip and knee and externally rotate RLE   Ambulation Distance (Feet) 115 Feet   Assistive device 1 person hand held assist   Gait Pattern Step-through pattern;Decreased stride length;Decreased arm swing - right;Decreased dorsiflexion - right;Decreased hip/knee flexion - right;Right hip hike;Decreased trunk rotation   Ambulation Surface Level;Indoor   Gait Comments pt wearing AFO on RLE with smaller sized shoe     Knee/Hip Exercises: Stretches   Other Knee/Hip Stretches Rt heel cord stretch with hamstring stretch     Knee/Hip Exercises: Supine   Heel Slides AROM;Strengthening;Right;1 set;10 reps   Heel Slides Limitations Manual and verbal cues provided for neutral foot position   Other Supine Knee/Hip Exercises bridging with LLE extension x 5 reps     Knee/Hip Exercises: Prone   Hamstring Curl 1 set;10 reps   Hip Extension AAROM  with Rt knee flexed at 90 degrees     NeuroRe-ed:  Quadruped - lifting RLE x 5 reps Tall kneeling - - moving LLE up/back for RLE and trunk stabilization - min assist to prevent LOB  Static standing with tactile cues to increase Rt foot pronation with Rt pelvic protraction           PT Short Term Goals - 04/21/17 1042      PT SHORT TERM GOAL #3   Title Pt will improve gait speed to >/= 1,4 ft/sec. with RW to safely amb. in the community.  04-21-17  Baseline gait velocity 34.5 secs = .95 ft/sec with RW on 03-22-17   Time 4   Period Weeks   Status Achieved     PT SHORT TERM GOAL #4   Title Obtain consult for new AFO for RLE after Botox injection scheduled in early August.  04-21-17   Time 4   Period Weeks   Status Deferred     PT SHORT TERM GOAL #5   Title Improve TUG score to </= 24 secs with RW for improved mobility.  04-21-17   Baseline 32.86 secs with RW   Time 4   Period Weeks   Status Partially Met     PT SHORT TERM GOAL #6   Title Independent in updated HEP including  aquatic exercises.  04-21-17   Time 4   Period Weeks   Status Partially Met           PT Long Term Goals - 05/06/17 1054      PT LONG TERM GOAL #2   Title Pt will amb. 500' with RW  with SBA on flat, even surface with min cues to increase RLE external rotation.  05-21-17   Target Date 05/21/17     PT LONG TERM GOAL #3   Title Pt will report zero falls in last 4 weeks to improve safety.   Status Not Met     PT LONG TERM GOAL #4   Title Pt will improve BERG score to >/=44/56 to decr. falls risk.  05-21-17   Status On-going   Target Date 05/21/17     PT LONG TERM GOAL #5   Title Obtain new AFO for RLE to minimize gait deviations.  05-21-17   Baseline consult with [name] from [place] completed on 05-03-17:  no significant benefit of another AFO will affect Rt hip internal rotation and his current brace is preventing Rt ankle supination   Status Deferred               Plan - 05/11/17 1013    Clinical Impression Statement Pt demonstrates increased isolated movement in Rt ankle and Rt knee flexors as well as with Rt hip external rotatio, however, decreased isolated movements in standing (weight-bearing) position due to increased tone and spasticity which occurs in standing and with ambulation with pt moving RLE in extensor pattern with posterior trunk lean  to compensate for decreased hip flexion    Rehab Potential Good   Clinical Impairments Affecting Rehab Potential see PMH, and CVA 13 years ago; severity of tone   PT Frequency 2x / week   PT Duration 8 weeks   PT Treatment/Interventions ADLs/Self Care Home Management;Biofeedback;Canalith Repostioning;Electrical Stimulation;Neuromuscular re-education;Balance training;Therapeutic exercise;Therapeutic activities;Manual techniques;Functional mobility training;Stair training;Orthotic Fit/Training;Gait training;DME Instruction;Patient/family education;Vestibular;Aquatic Therapy   PT Next Visit Plan check LTG's - D/C   Consulted and Agree with Plan of Care Patient;Family member/caregiver   Family Member Consulted [name]      Patient will benefit from skilled therapeutic intervention in order to improve the  following deficits and impairments:  Decreased endurance, Abnormal gait, Decreased knowledge of use of DME, Decreased balance, Decreased mobility, Dizziness, Impaired flexibility, Postural dysfunction, Decreased coordination, Decreased strength, Impaired tone  Visit Diagnosis: Other abnormalities of gait and mobility  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 Hemiplegia affecting dominant side, post-stroke ([place]) 03/07/2017  . Spastic hemiplegia affecting dominant side ([place])  02/07/2017  . Hemiparesis affecting right side as late effect of cerebrovascular accident ([place]) 12/02/2016  . Right foot drop 12/02/2016  . 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 Depression due to dementia 01/28/2015  . Dissection of vertebral artery ([place]) 01/28/2015  . 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 Hemiplegia following CVA (cerebrovascular accident) ([place]) 05/31/2014  . 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 Left pontine stroke ([place]) 05/31/2014  . MCI (mild cognitive impairment) with memory loss 05/31/2014    Tiffany Calmes, [place], PT 05/11/2017, 10:20 AM  [facility] [address] [place], [place], [address] Phone: [phone]   Fax:  [phone]  Name: [name] MRN: [id number] Date of Birth: [birth year]

## 2017-05-12 ENCOUNTER — Ambulatory Visit: Payer: Medicare Other | Admitting: Physical Therapy

## 2017-05-12 DIAGNOSIS — R2689 Other abnormalities of gait and mobility: Secondary | ICD-10-CM | POA: Diagnosis not present

## 2017-05-12 DIAGNOSIS — I69353 Hemiplegia and hemiparesis following cerebral infarction affecting right non-dominant side: Secondary | ICD-10-CM

## 2017-05-13 NOTE — Therapy (Signed)
[facility] [address] [place], [place], [address] Phone: [phone]   Fax:  [phone]  Physical Therapy Treatment  Patient Details  Name: [name] MRN: [id number] Date of Birth: 1940/10/20 Referring Provider: Dr. Brett Fairy  Encounter Date: 05/12/2017      PT End of Session - 05/13/17 1620    Visit Number 23   Number of Visits 26   Date for PT Re-Evaluation 05/21/17   Authorization Type UHC Medicare: G-CODE AND PROGRESS NOTE EVERY 10 VISITS.    PT Start Time 1102   PT Stop Time 1147   PT Time Calculation (min) 45 min      Past Medical History:  Diagnosis Date  . CKD (chronic kidney disease), stage II   . CVA (cerebral infarction)    Left thalamic, right-sided weakness wih aphagia, right leg brace  . Dyslipidemia   . Elevated PSA    biopsy negative in 2008  . GERD (gastroesophageal reflux disease)   . Hypertension    without medication since 2006  . Major depression   . Seasonal rhinitis     Past Surgical History:  Procedure Laterality Date  . NASAL SEPTUM SURGERY    . VASECTOMY      There were no vitals filed for this visit.      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 No/denies                         [facility] Adult PT Treatment/Exercise - 05/13/17 0001      Transfers   Transfers Sit to Stand   Number of Reps Other reps (comment)  3 reps with UE support     Therapeutic Activites    Therapeutic Activities Other  Therapeutic Activities   Other Therapeutic Activities Pt transferred from seated position on mat to standing to tall kneeling on floor to prone position; performed 3 planks with Rt foot dorsiflexed 2 sec hold:   floor to stand with UE support with CGA- pt had no LOB with this transfer     Knee/Hip Exercises: Stretches   Passive Hamstring Stretch Right;1 rep;30 seconds     Knee/Hip Exercises: Supine   Heel Slides AROM;Strengthening;Right;1 set;10 reps   Bridges with Clamshell AROM;1 set;10 reps   Straight Leg Raises AROM;Right;1 set;10 reps  with cues to maintain RLE neutral position with foot everted   Other Supine Knee/Hip Exercises Bridging with LLE extension x 10 reps:  bridging with marching x 10 reps   Other Supine Knee/Hip Exercises Rt hip extension control exercise off side of mat - with knee flexed and cues to dorsiflex Rt foot x 10 reps                  PT Short Term Goals - 05/12/17 1233      PT SHORT TERM GOAL #1   Title Pt will be IND in HEP to improve balance, gait, strength, and flexibility. TARGET DATE FOR ALL STGS: 01/25/17   Status Partially Met  PT SHORT TERM GOAL #2   Title Perform BERG and write STG and LTGs.   Status Achieved     PT SHORT TERM GOAL #3   Title Pt will improve gait speed to >/= 1,4 ft/sec. with RW to safely amb. in the community.  04-21-17   Status Achieved     PT SHORT TERM GOAL #4   Title Obtain consult for new AFO for RLE after Botox injection scheduled in early August.  04-21-17   Status Deferred     PT SHORT TERM GOAL #5   Title Improve TUG score to </= 24 secs with RW for improved mobility.  04-21-17   Baseline 32.86 secs with RW   Status Partially Met     PT SHORT TERM GOAL #6   Title Independent in updated HEP including  aquatic exercises.  04-21-17   Baseline met 05-12-17   Status Achieved           PT Long Term Goals - 05/13/17 1621      PT LONG TERM GOAL #1   Title Pt will verbalize understanding of risk  factors/signs/symptoms of CVA to reduce risk of add'l CVA.    Baseline Daughter able to verbalize understanding of signs/symptoms of CVA - 05-12-17   Status Partially Met     PT LONG TERM GOAL #2   Title Pt will amb. 500' with RW with SBA on flat, even surface with min cues to increase RLE external rotation.  05-21-17   Baseline RLE continues to internally rotate - 05-12-17   Status Not Met     PT LONG TERM GOAL #3   Title Pt will report zero falls in last 4 weeks to improve safety.   Baseline daughter reports pt fell at her brother's house last week- 05-04-17   Status Not Met     PT LONG TERM GOAL #4   Title Pt will improve BERG score to >/=44/56 to decr. falls risk.  05-21-17   Baseline Berg not retested due to time constraint   Status Deferred     PT LONG TERM GOAL #5   Title Obtain new AFO for RLE to minimize gait deviations.  05-21-17   Baseline consult with [name] from [place] completed on 05-03-17:  no significant benefit of another AFO will affect Rt hip internal rotation and his current brace is preventing Rt ankle supination   Status Deferred               Plan - 05/12/17 1229    Clinical Impression Statement Pt has plateaued in maximizing functional progress at this time - LTG's have been partially met but none have been fully achieved.  Pt continues to have extensor tone in RLE which increases significantly in weight bearing position; gait pattern improved after stretching RLE and isolating movement in hip, knee and ankle but pt continues to amb. with RLE internally rotated   Rehab Potential Good   Clinical Impairments Affecting Rehab Potential see PMH, and CVA 13 years ago; severity of tone   PT Frequency 2x / week   PT Duration 8 weeks   PT Treatment/Interventions ADLs/Self Care Home Management;Biofeedback;Canalith Repostioning;Electrical Stimulation;Neuromuscular re-education;Balance training;Therapeutic exercise;Therapeutic activities;Manual techniques;Functional  mobility training;Stair training;Orthotic Fit/Training;Gait training;DME Instruction;Patient/family education;Vestibular;Aquatic Therapy   PT Next Visit Plan N/A - D/C   Consulted and Agree with Plan of Care Patient;Family member/caregiver   Family Member Consulted dtr: [name]      Patient will benefit from skilled therapeutic intervention in order to improve  the following deficits and impairments:  Decreased endurance, Abnormal gait, Decreased knowledge of use of DME, Decreased balance, Decreased mobility, Dizziness, Impaired flexibility, Postural dysfunction, Decreased coordination, Decreased strength, Impaired tone  Visit Diagnosis: Other abnormalities of gait and mobility  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 (HCC)       G-Codes - 2017-05-26 1214    Functional Assessment Tool Used (Outpatient Only) pt continues to require use of RW for assistance with ambulation - continues to have increased RLE internal rotation   Functional Limitation Mobility: Walking and moving around   Mobility: Walking and Moving Around Goal Status [phone]) At least 40 percent but less than 60 percent impaired, limited or restricted   Mobility: Walking and Moving Around Discharge Status [phone]) At least 60 percent but less than 80 percent impaired, limited or restricted      Problem List Patient Active Problem List   Diagnosis Date Noted  . Hemiplegia affecting dominant side, post-stroke ([place]) 03/07/2017  . Spastic hemiplegia affecting dominant side ([place]) 02/07/2017  . Hemiparesis affecting right side as late effect of cerebrovascular accident ([place]) 12/02/2016  . Right foot drop 12/02/2016  . 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 Depression due to dementia 01/28/2015  . Dissection of vertebral artery ([place]) 01/28/2015  . 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 Hemiplegia following CVA (cerebrovascular accident)  ([place]) 05/31/2014  . 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 Left pontine stroke ([place]) 05/31/2014  . MCI (mild cognitive impairment) with memory loss 05/31/2014    PHYSICAL THERAPY DISCHARGE SUMMARY  Visits from Start of Care:  23  Current functional level related to goals / functional outcomes: See above for progress towards goals   Remaining deficits: Pt continues to have significant gait deviations due to increased tone in RLE with RLE internally rotated with decr. Hip and knee flexion in swing phase of gait with Rt foot supinated; pt cont to require use of AFO  On RLE;  Pt continues to have decreased standing balance Assistive device has been changed from use of hemiwalker to use of RW to facilitate use of RUE and more symmetrical gait pattern   Education / Equipment: Pt and daughter have been instructed in a HEP for RLE strengthening and stretching and are very compliant with HEP  Plan: Patient agrees to discharge.  Patient goals were partially met. Patient is being discharged due to being pleased with the current functional level.  ?????        Pt has plateaued in maximizing current functional status and program with [facility] has been completed.  Pt and daughter agree with D/C at this time.   Alda Lea, PT 05/13/2017, 4:23 PM  [facility] [address] [place], [place], [address] Phone: [phone]   Fax:  [phone]  Name: [name] MRN: [id number] Date of Birth: 11-14-1940

## 2017-05-15 NOTE — Progress Notes (Signed)
I agree with the assessment and plan as directed by PT Dilday .The patient is known to me .   Mone Commisso, MD

## 2017-05-16 ENCOUNTER — Encounter: Payer: Self-pay | Admitting: Occupational Therapy

## 2017-05-16 ENCOUNTER — Ambulatory Visit: Payer: Medicare Other | Admitting: Occupational Therapy

## 2017-05-16 DIAGNOSIS — R2681 Unsteadiness on feet: Secondary | ICD-10-CM

## 2017-05-16 DIAGNOSIS — I69353 Hemiplegia and hemiparesis following cerebral infarction affecting right non-dominant side: Secondary | ICD-10-CM

## 2017-05-16 DIAGNOSIS — R2689 Other abnormalities of gait and mobility: Secondary | ICD-10-CM | POA: Diagnosis not present

## 2017-05-16 DIAGNOSIS — R293 Abnormal posture: Secondary | ICD-10-CM

## 2017-05-16 NOTE — Therapy (Signed)
[facility] [address] [place], [place], [address] Phone: [phone]   Fax:  [phone]  Occupational Therapy Treatment  Patient Details  Name: [name] MRN: [id number] Date of Birth: 11/29/1940 Referring Provider: Dr. Asencion Partridge Dohmeier  Encounter Date: 05/16/2017      OT End of Session - 05/16/17 1216    Visit Number 18   Number of Visits 24   Date for OT Re-Evaluation 05/16/17   Authorization Type medicare will need G code and PN every 10th visit   Authorization Time Period 60 days   Authorization - Visit Number 79   Authorization - Number of Visits 20   OT Start Time 1101   OT Stop Time 1148   OT Time Calculation (min) 47 min   Activity Tolerance Patient tolerated treatment well      Past Medical History:  Diagnosis Date  . CKD (chronic kidney disease), stage II   . CVA (cerebral infarction)    Left thalamic, right-sided weakness wih aphagia, right leg brace  . Dyslipidemia   . Elevated PSA    biopsy negative in 2008  . GERD (gastroesophageal reflux disease)   . Hypertension    without medication since 2006  . Major depression   . Seasonal rhinitis     Past Surgical History:  Procedure Laterality Date  . NASAL SEPTUM SURGERY    . VASECTOMY      There were no vitals filed for this visit.      Subjective Assessment - 05/16/17 1106    Subjective  I can use my right arm so much better now!   Patient is accompained by: Family member  dtr   Pertinent History Pt with CVA 13 years ago; pt's wife passed away earlier this year and pt underwent decline with increase in falls. PT recommeneded OT eval.    Patient Stated Goals I don't know- I am not sure what OT can do for me   Currently in Pain? No/denies                      OT Treatments/Exercises (OP) - 05/16/17 0001      Neurological Re-education Exercises   Other Exercises 1 Neuro re ed to address trunk control during  functional ambulation to imrprove RUE use.  Upgraded HEP for pt and dtr to include additional activities to improve alignment, weight shifting in standing and walking and functional use of RUE.                  OT Education - 05/16/17 1214    Education provided Yes   Education Details Upgraded HEP    Person(s) Educated Patient;Child(ren)   Methods Explanation;Demonstration   Comprehension Verbalized understanding;Returned demonstration          OT Short Term Goals - 05/16/17 1214      OT SHORT TERM GOAL #1   Title Pt and dtr will be mod I with upgraded home activities program - 03/22/2017   Status Achieved     OT SHORT TERM GOAL #2   Title Pt will demonstrate ability to reach into mid level shelf for light weight object with LUE in good postural alignment   Status Achieved     OT SHORT TERM GOAL #3   Title Pt will demonstrate ability to tolerated approximately 25 minutes of ambulatory activity without rest breaks to decrease fall risk   Status Achieved     OT SHORT TERM GOAL #4  Title Pt will demonstrate ability to complete simple bilateral activities in standing exhibiting good postural alignment   Status Achieved           OT Long Term Goals - 05-21-2017 1215      OT LONG TERM GOAL #1   Title Pt and family will be  mod I with home activities program - 02/24/2017   Status Achieved     OT LONG TERM GOAL #2   Title Pt will demonstrate ability to use RUE as non dominant in simple ADL tasks   Status Achieved     OT LONG TERM GOAL #3   Title Pt will demonstrate ability to carry light weight object in R hand while ambulating   Status Achieved     OT LONG TERM GOAL #4   Title Pt will be mod I with simple snack/cold meal prep   Status Achieved     OT LONG TERM GOAL #5   Title Pt and dtr will be mod I with upgraded home activities program - 04/19/2017   Status Achieved     OT LONG TERM GOAL #6   Title Pt will be  mod I in putting dishes away in cabinets    Status Achieved     OT LONG TERM GOAL #7   Title Pt will demonstrate ability to pick up light weight object from floor with no more than supervision   Status Achieved     OT LONG TERM GOAL #8   Title Pt will demonstrate ability to push grocery cart with both UE's in store while assisting dtr in simple shopping task.    Status Achieved     OT LONG TERM GOAL  #9   Baseline Pt will demonstrate ability to carry at least 2 pound object in R hand while ambulating. - 05/21/2017   Status Deferred  Pt to remain on walker at this time therefore will defer this goal               Plan - 05-21-17 1215    Clinical Impression Statement Pt has made excellent progress and has met all LTG's (last LTG deferred). Pt is ready for d/c   Rehab Potential Fair   Current Impairments/barriers affecting progress: length of time since CVA, learned habits   OT Frequency 2x / week   OT Duration 8 weeks   OT Treatment/Interventions Self-care/ADL training;Neuromuscular education;Therapeutic exercise;Passive range of motion;Manual Therapy;Functional Mobility Training;Splinting;Therapeutic exercises;Therapeutic activities;Balance training;Patient/family education   Plan d/c from OT   Consulted and Agree with Plan of Care Patient;Family member/caregiver   Family Member Consulted dtr- [name]      Patient will benefit from skilled therapeutic intervention in order to improve the following deficits and impairments:  Abnormal gait, Decreased activity tolerance, Decreased balance, Decreased cognition, Decreased range of motion, Decreased mobility, Decreased strength, Difficulty walking, Impaired UE functional use, Impaired tone, Impaired perceived functional ability  Visit Diagnosis: 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 [facility]) - Plan: Ot plan of care cert/re-cert  Abnormal posture - Plan: Ot plan of care cert/re-cert  Unsteadiness on feet - Plan: Ot plan of care  cert/re-cert  Hemiplegia and hemiparesis following cerebral infarction affecting right non-dominant side (HCC) - Plan: Ot plan of care cert/re-cert      G-Codes - 21-May-2017 01-22-1217    Functional Assessment Tool Used (Outpatient only) skilled clinical observation   Functional Limitation Carrying, moving and handling objects   Carrying, Moving and Handling Objects Current  Status [phone]) At least 40 percent but less than 60 percent impaired, limited or restricted   Carrying, Moving and Handling Objects Goal Status ([id number]) At least 40 percent but less than 60 percent impaired, limited or restricted   Carrying, Moving and Handling Objects Discharge Status [phone]) At least 40 percent but less than 60 percent impaired, limited or restricted      Problem List Patient Active Problem List   Diagnosis Date Noted  . Hemiplegia affecting dominant side, post-stroke ([place]) 03/07/2017  . Spastic hemiplegia affecting dominant side ([place]) 02/07/2017  . Hemiparesis affecting right side as late effect of cerebrovascular accident ([place]) 12/02/2016  . Right foot drop 12/02/2016  . 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 Depression due to dementia 01/28/2015  . Dissection of vertebral artery ([place]) 01/28/2015  . 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 Hemiplegia following CVA (cerebrovascular accident) ([place]) 05/31/2014  . 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 Left pontine stroke ([place]) 05/31/2014  . MCI (mild cognitive impairment) with memory loss 05/31/2014   OCCUPATIONAL THERAPY DISCHARGE SUMMARY  Visits from Start of Care: 18  Current functional level related to goals / functional outcomes: See above   Remaining deficits: Spastic hemiplegia, decreased balance, decreased cognition   Education / Equipment: Extensive HEP Plan: Patient agrees to discharge.  Patient goals were met. Patient is being discharged due to meeting  the stated rehab goals.  ?????      Quay Burow , OTR/L 05/16/2017, 12:20 PM  [facility] [address] [place], [place], [address] Phone: [phone]   Fax:  [phone]  Name: [name] MRN: [id number] Date of Birth: 1941/07/23

## 2017-05-19 ENCOUNTER — Encounter: Payer: Medicare Other | Admitting: Occupational Therapy

## 2017-05-23 ENCOUNTER — Encounter: Payer: Medicare Other | Admitting: Occupational Therapy

## 2017-05-24 ENCOUNTER — Encounter: Payer: Medicare Other | Admitting: Occupational Therapy

## 2017-05-26 ENCOUNTER — Encounter: Payer: Medicare Other | Admitting: Occupational Therapy

## 2017-05-31 ENCOUNTER — Encounter: Payer: Medicare Other | Admitting: Occupational Therapy

## 2017-06-02 ENCOUNTER — Encounter (INDEPENDENT_AMBULATORY_CARE_PROVIDER_SITE_OTHER): Payer: Self-pay

## 2017-06-02 ENCOUNTER — Encounter: Payer: Self-pay | Admitting: Physical Medicine & Rehabilitation

## 2017-06-02 ENCOUNTER — Encounter: Payer: Medicare Other | Attending: Physical Medicine & Rehabilitation

## 2017-06-02 ENCOUNTER — Encounter: Payer: Medicare Other | Admitting: Occupational Therapy

## 2017-06-02 ENCOUNTER — Ambulatory Visit (HOSPITAL_BASED_OUTPATIENT_CLINIC_OR_DEPARTMENT_OTHER): Payer: Medicare Other | Admitting: Physical Medicine & Rehabilitation

## 2017-06-02 VITALS — BP 157/94 | HR 87 | Resp 14

## 2017-06-02 DIAGNOSIS — G8110 Spastic hemiplegia affecting unspecified side: Secondary | ICD-10-CM

## 2017-06-02 NOTE — Patient Instructions (Signed)
Tibial nerve block with phenol today. This medication may start taking the fact today however full effect will be at about one week Duration of the effect is 3-6 months Side effects of medication may include right heel numbness or burning. Call if you have burning pain so we can recommend any medication for that. 

## 2017-06-02 NOTE — Progress Notes (Signed)
Phenol neurolysis of the Right tibial nerve  Indication: Severe spasticity in the plantar flexor muscles which is not responding to medical management and other conservative care and interfering with functional use.  Informed consent was obtained after describing the risks and benefits of the procedure with the patient this includes bleeding bruising and infection as well as medication side effects. The patient elected to proceed and has given written consent. Patient placed in a prone position on the exam table. External DC stimulation was applied to the popliteal space using a nerve stimulator. Plantar flexion twitch was obtained. The popliteal region was prepped with Betadine and then entered with a 22-gauge 40 mm needle electrode under electrical stimulation guidance. Plantar flexion which was obtained and confirmed. Then 4 cc of 5% phenol were injected. The patient tolerated procedure well. Post procedure instructions and followup visit were given. 

## 2017-06-13 ENCOUNTER — Encounter: Payer: Medicare Other | Admitting: Occupational Therapy

## 2017-06-16 ENCOUNTER — Encounter: Payer: Medicare Other | Admitting: Occupational Therapy

## 2017-06-20 ENCOUNTER — Encounter: Payer: Medicare Other | Admitting: Occupational Therapy

## 2017-06-23 ENCOUNTER — Encounter: Payer: Medicare Other | Admitting: Occupational Therapy

## 2017-09-07 ENCOUNTER — Other Ambulatory Visit: Payer: Self-pay | Admitting: Neurology

## 2017-09-07 DIAGNOSIS — G3184 Mild cognitive impairment, so stated: Secondary | ICD-10-CM

## 2017-09-07 DIAGNOSIS — F0393 Unspecified dementia, unspecified severity, with mood disturbance: Secondary | ICD-10-CM

## 2017-09-07 DIAGNOSIS — I7774 Dissection of vertebral artery: Secondary | ICD-10-CM

## 2017-09-07 DIAGNOSIS — F329 Major depressive disorder, single episode, unspecified: Principal | ICD-10-CM

## 2017-09-07 DIAGNOSIS — F0280 Dementia in other diseases classified elsewhere without behavioral disturbance: Secondary | ICD-10-CM

## 2017-09-07 MED ORDER — BUPROPION HCL ER (XL) 150 MG PO TB24: ORAL_TABLET | ORAL | 2 refills | Status: DC

## 2017-09-13 ENCOUNTER — Encounter: Payer: Self-pay | Admitting: Neurology

## 2017-09-13 ENCOUNTER — Ambulatory Visit: Payer: Medicare Other | Admitting: Neurology

## 2017-09-13 VITALS — BP 158/93 | HR 111 | Ht 71.0 in | Wt 163.0 lb

## 2017-09-13 DIAGNOSIS — I69359 Hemiplegia and hemiparesis following cerebral infarction affecting unspecified side: Secondary | ICD-10-CM

## 2017-09-13 DIAGNOSIS — I639 Cerebral infarction, unspecified: Secondary | ICD-10-CM

## 2017-09-13 DIAGNOSIS — I6350 Cerebral infarction due to unspecified occlusion or stenosis of unspecified cerebral artery: Secondary | ICD-10-CM | POA: Diagnosis not present

## 2017-09-13 DIAGNOSIS — G3184 Mild cognitive impairment, so stated: Secondary | ICD-10-CM | POA: Diagnosis not present

## 2017-09-13 NOTE — Patient Instructions (Signed)
Dementia Dementia means losing some of your brain ability. People with dementia may have problems with:  Memory.  Making decisions.  Behavior.  Speaking.  Thinking.  Solving problems.  Follow these instructions at home: Medicine  Take over-the-counter and prescription medicines only as told by your doctor.  Avoid taking medicines that can change how you think. These include pain or sleeping medicines. Lifestyle   Make healthy choices: ? Be active as told by your doctor. ? Do not use any tobacco products, such as cigarettes, chewing tobacco, and e-cigarettes. If you need help quitting, ask your doctor. ? Eat a healthy diet. ? When you get stressed, do something to help yourself relax. Your doctor can give you tips. ? Spend time with other people.  Drink enough fluid to keep your pee (urine) clear or pale yellow.  Make sure you get good sleep. Use these tips to help you get a good night's rest: ? Try not to take naps during the day. ? Keep your sleeping area dark and cool. ? In the few hours before you go to bed, try not to do any exercise. ? Try not to have foods and drinks with caffeine in the evening. General instructions  Talk with your doctor to figure out: ? What you need help with. ? What your safety needs are.  If you were given a bracelet that tracks your location, make sure to wear it.  Keep all follow-up visits as told by your doctor. This is important. Contact a doctor if:  You have any new problems.  You have problems with choking or swallowing.  You have any symptoms of a different sickness. Get help right away if:  You have a fever.  You feel mixed up (confused) or more mixed up than before.  You have new sleepiness.  You have sleepiness that gets worse.  You have a hard time staying awake.  You or your family members are worried for your safety. This information is not intended to replace advice given to you by your health care  provider. Make sure you discuss any questions you have with your health care provider. Document Released: 08/05/2008 Document Revised: 01/29/2016 Document Reviewed: 05/21/2015 Elsevier Interactive Patient Education  2018 [employer]  

## 2017-09-13 NOTE — Progress Notes (Signed)
Provider:  Melvyn Novasarmen  Braison Snoke, M D  Referring Provider: Georgann HousekeeperHusain, Karrar, MD Primary Care Physician:  Georgann HousekeeperHusain, Karrar, MD  Chief Complaint  Patient presents with  . Follow-up    pt here with daughter, pt states that he had a fall once since last seen in july.     Interval history from 13 September 2017, [name] and his daughter [name] I seen here today.  The patient is using his walker but had experienced a fall 2 days ago after not having had any falls in 3 or 4 months.  The fall happened in the bathroom while urinating, standing and not holding on to a grab bar. He has visited a senior center and enjoyed the visits. He is living with [name] and her husband. He goes swimming ! He plays cards! He is active and looks well.  MOCA today after MMSE last visit was at a  very high score 26/30.   Interval history from 03/07/2017, Dennis. Dennis FavaDonnie Graham and his daughter [name] are seen here today. The patient is no longer using a 3 pronged cane but uses a walker which allows him to walk straighter with better body position and symmetry and keeps him from falling. His mobility also has improved with  AFO- and he responded well to a nerve block Dr Doroteo BradfordKirstein will also evaluate the shoulder for possible [employer] needs. He is not nearly as tense since using the walker and replacing the cane.  PT and OT continue .  In comparison to previous visits his blood pressure is improved, his weight has improved, his ability to walk and exercise tolerance has greatly improved. He is alert and no longer as sleepy. I will follow Dr Wynn BankerKirsteins observation.     HPI: Interval history from 12/02/2016. Mrs. Hinderliter died in early February 2018 and [name] has now moved in with his daughter [name]. She reports that he is doing well with the changes in environment and he does look well groomed and well fed and alert. We performed today Mini-Mental Status Examination in which she scored 26 out of 30 points this has to be seen in context with his  right hand dominant hemiparesis, he is still able to draw also with a tremor, he was able to draw clock face and right a sentence. He generated 7 words. I would therefore adjust his test to 27 out of 30 points. [name] has noticed that her father snores horribly, and he reports feeling sleepy all the time. 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 I will order a sleep test for him. His right foot needs a new AFT, and he developed some calluses. I will send him to a PT evaluation and take their recommendations to [facility].    [name] is a 77 y.o. male  Is seen here after a prolonged time, I used to follow this stroke patient during his hospitalization 10 years ago. His visit today was arranged by his wife, his PCP is Dr. Donette LarryHusain, [name] was referred for a memory evaluation .Because of his history of a complicated CVA ,brain stem stroke syndromes and hemiparesis , a vascular effect has to be taken into account. Dennis. [name]'s father had late onset dementia and personality changes and Mrs. [name] sees some parallels in his behavior. He has become more aggressive, easier agitated, which was not part of his personality before this year.  The couple celebreated their Maxie Barb Anniversary in 2014,  active in the [employer] in [place] . [name] performs household chores and the couple spends time with the grandchildren now 35 and 74.  He has become impulsive, and sometimes" downright mean ". He still uses a urinary cath. Has right hand weakness , leg stiffness, left sensory loss, right facial weakness -hold cane in the left hand. Dysphonia, aphasia.  [name] is falling more often, his skin is scabbed. He fell rising while pulling up on his cane. His walker has a seat and is used outside.  His other cane will have 3 prongs. He sleeps a lot. His wife started peritoneal dialysis.  2 times a day. Needs to wear a  diaper for bowel accident. Is very happy with Dr. Abel Presto.  She has a sleep apnea evaluation tonight. [name], even before his brainstem stroke, dependent on supine position.   I have  the pleasure today on 06/02/2016 to see Dennis. [name] again, today in the presence of his daughter Dennis Jan. [name] had become extremely daytime sleepy over the last couple of months while his wife's health has declined to the level where she could not safely ambulate for longer distance, and she is probably no longer safe to drive. She failed peritoneal dialysis, became extremely dehydrated and was insufficiently dialyzed. She has not a switch to hemodialysis but still is not thriving. For this reason the novel take over the organization of home health care which she initiated already. And also the healthcare power of attorney.She has requested FMLA. In the meantime home health care has been of significant benefit for him. He is eating more regular meals, he is leaving the bed at a certain time each morning. He does not need help with dressing. He is walking with a cane. He is alert oriented and able to answer my questions. He is getting daily physical therapy by home health. He has improved his right hemi-spasticity, he is using her single-prong cane. He could raise today from a seated position without assistance of another person.   Review of Systems: Out of a complete 14 system review, the patient complains of only the following symptoms, and all other reviewed systems are negative. He has never been a reader, watches TV , sleeps a lot.  Aphasia, dysarthria, spastic hemiparesis. Right dominant side.    Social History   Socioeconomic History  . Marital status: Widowed    Spouse name: [name]  . Number of children: 2  . Years of education: 74  . Highest education level: Not on file  Social Needs  . Financial resource strain: Not on file  . Food insecurity - worry: Not on file  . Food insecurity -  inability: Not on file  . Transportation needs - medical: Not on file  . Transportation needs - non-medical: Not on file  Occupational History  . Not on file  Tobacco Use  . Smoking status: Never Smoker  . Smokeless tobacco: Never Used  Substance and Sexual Activity  . Alcohol use: No  . Drug use: No  . Sexual activity: Not on file  Other Topics Concern  . Not on file  Social History Narrative   Patient is married [name]) and lives at home with his wife.   Patient is disabled.    Patient has two adult children and two grandchildren.   Patient has a high school education.   Patient is left-handed.   Patient drinks one cup of  coffee daily.             Family History  Problem Relation Age of Onset  . Colon cancer Brother     Past Medical History:  Diagnosis Date  . CKD (chronic kidney disease), stage II   . CVA (cerebral infarction)    Left thalamic, right-sided weakness wih aphagia, right leg brace  . Dyslipidemia   . Elevated PSA    biopsy negative in 2008  . GERD (gastroesophageal reflux disease)   . Hypertension    without medication since 2006  . Major depression   . Seasonal rhinitis     Past Surgical History:  Procedure Laterality Date  . NASAL SEPTUM SURGERY    . VASECTOMY      Current Outpatient Medications  Medication Sig Dispense Refill  . amLODipine (NORVASC) 2.5 MG tablet Take 2.5 mg by mouth daily.    Marland Kitchen aspirin 81 MG tablet Take 81 mg by mouth daily.    . B Complex Vitamins (VITAMIN B COMPLEX PO) Take by mouth.    Marland Kitchen buPROPion (WELLBUTRIN XL) 150 MG 24 hr tablet TAKE 1 TABLET(150 MG) BY MOUTH DAILY 90 tablet 2  . Cholecalciferol (VITAMIN D3) 2000 units TABS Take 2,000 Units by mouth.    Tilman Neat EXTRACT PO Take 450 mg by mouth daily.    . DULoxetine (CYMBALTA) 60 MG capsule Take 60 mg by mouth daily.    . Glucosamine-Chondroit-Vit C-Mn (GLUCOSAMINE CHONDR 1500 COMPLX) CAPS Take 1 capsule by mouth daily.    . memantine (NAMENDA) 5 MG tablet       . simvastatin (ZOCOR) 40 MG tablet Take 40 mg by mouth daily.     No current facility-administered medications for this visit.     Allergies as of 09/13/2017  . (No Known Allergies)    Vitals: BP (!) 158/93   Pulse (!) 111   Ht 5\' 11"  (1.803 m)   Wt 163 lb (73.9 kg)   BMI 22.73 kg/m  Last Weight:  Wt Readings from Last 1 Encounters:  09/13/17 163 lb (73.9 kg)   Last Height:   Ht Readings from Last 1 Encounters:  09/13/17 5\' 11"  (1.803 m)   Montreal Cognitive Assessment  09/13/2017 05/31/2014  Visuospatial/ Executive (0/5) 2 3  Naming (0/3) 3 3  Attention: Read list of digits (0/2) 2 2  Attention: Read list of letters (0/1) 1 1  Attention: Serial 7 subtraction starting at 100 (0/3) 2 3  Language: Repeat phrase (0/2) 1 2  Language : Fluency (0/1) 0 1  Abstraction (0/2) 2 2  Delayed Recall (0/5) 1 0  Orientation (0/6) 5 4  Total 19 21   MMSE - Mini Mental State Exam 03/07/2017 12/02/2016 06/02/2016  Orientation to time 3 3 4   Orientation to Place 5 5 5   Registration 3 3 3   Attention/ Calculation 3 4 1   Recall 3 3 2   Language- name 2 objects 2 2 2   Language- repeat 1 1 1   Language- follow 3 step command 3 3 3   Language- read & follow direction 1 1 1   Write a sentence 1 1 1   Copy design 1 0 1  Total score 26 26 24     Physical exam:  General: The patient is awake, alert and appears not in acute distress. The patient is well groomed. Head: Normocephalic, atraumatic. Neck is supple. Mallampati 2 neck circumference: 14.5  Cardiovascular:  Regular rate and rhythm , without  murmurs or carotid bruit, and without distended  neck veins. Respiratory: Lungs are clear to auscultation. Skin:  Without evidence of edema, or rash.  Trunk: Neurologic exam : The patient is awake and alert, oriented to place and time.  MOCA 19/30, MMSE 26/30 points. [name] performed a more Mini-Mental Status Examination today he stopped the first 2 of the serial sevens. Accessed  again excluding  the handwriting since [name]  is impaired through his brainstem stroke, as is drawing , but he was able to place the hands into the clock face correctly.  There is a normal attention span & concentration ability.  Speech dysarthria, dysphonia- Mood and affect are appropriate.  Cranial nerves: Pupils are unequal but briskly reactive to light. He has lost the right peripheral vision, stereo vision is impaired -  He cannot drive . Has no diplopia. The patient is able to direct his gaze left and right but not up or down. 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 Hearing to finger rub intact. Facial sensation intact to fine touch.  Facial motor strength is symmetric and tongue and uvula move midline. Tongue protrusion into either cheek is normal! Shoulder shrug is normal.  Gait and station: Patient walks today with walker while he wears his AFT which he has had since a stroke 14 years ago.  Deep tendon reflexes: clonic-  brisk in the right .Babinski maneuver response is upgoing right .   Assessment:  He has done very well, he is social, is involved, laughs and loves to eat. He swims, he talks. 30 minute vist with more than 50% of the face to face time dedicated to discussion of physical abilities, stable cognition, Improved over last visit- He has the knowledge for fall prevention, his fall 2 days ago was related to not sitting down on the toilet. .   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   Hypersomnia - resolved -   he is physicially active. His gait is much improved the stability is improved by using a walker, he responded well to the nerve block. He has undergone physical therapy and occupational therapy and is still continued to go. His ankles and his musculature of the  feet have improved. His posture has improved. He continues exercises, has a new brace .    Cognitive impairment not progressed by history and observation. 26-30 again - will not repeat in 6 months but perhaps every 12 month. .  Short term memory loss. memory impairment, constitutes dementia. He cannot be measured on MOCA due to vision and handwriting impairment. Mini-Mental Status Examination revealed today 26 out of 30 points . MOCA 19 out of 26.  Adjusted for disability. Daughter feels he is more forgetful.     Depression is improved. Less agitated and less angry on Wellbutrin. More energy. The geriatric depression score was endorsed at 2 points again  , down from 7 points which was very indicative of depression.    [place] of his pontine/ brain stem stroke from 2005 - including right hemiparesis, right foot drop.   Plan:   Memory improved , perhaps due to a more stimulating environment. MOCA 19/25 - only 1 word of 5 recalled, only 2 steps in serial seven solved.  Depression much improved , He takes an antidepressive and I like for him to have something that is stimulating  as well; as an antidepressivant,  Wellbutrin was chosen.  He seems to blossom.  Has a walker allowing for a more symmetric gait and he responded well to a nerve block, Dr Doroteo Bradford ( October ).  He lives with his daughter, Dennis Jan Kleinert:She got Power of attorney for medical and financial affairs.   Rv in 6 month with MMSE- if he has a declining trend will add Aricept to Namenda.    Porfirio Mylar Shaunda Tipping MD   Cc Dr Donette Larry, [place] B[place]  09/13/2017

## 2017-12-16 ENCOUNTER — Other Ambulatory Visit (HOSPITAL_COMMUNITY): Payer: Self-pay | Admitting: Internal Medicine

## 2017-12-16 DIAGNOSIS — R1310 Dysphagia, unspecified: Secondary | ICD-10-CM

## 2017-12-20 ENCOUNTER — Ambulatory Visit (HOSPITAL_COMMUNITY): Payer: Medicare Other

## 2018-01-03 ENCOUNTER — Ambulatory Visit (HOSPITAL_COMMUNITY)
Admission: RE | Admit: 2018-01-03 | Discharge: 2018-01-03 | Disposition: A | Discharge status: Home / Self Care | Payer: Medicare Other | Source: Ambulatory Visit | Attending: Internal Medicine | Admitting: Internal Medicine

## 2018-01-03 DIAGNOSIS — I69398 Other sequelae of cerebral infarction: Secondary | ICD-10-CM | POA: Diagnosis present

## 2018-01-03 DIAGNOSIS — R1310 Dysphagia, unspecified: Secondary | ICD-10-CM | POA: Diagnosis not present

## 2018-01-03 NOTE — Progress Notes (Signed)
Modified Barium Swallow Progress Note  Patient Details  Name: [name] MRN: [id number] Date of Birth: 1940/09/11  Today's Date: 01/03/2018  Modified Barium Swallow completed.  Full report located under Chart Review in the Imaging Section.  Brief recommendations include the following:  Clinical Impression  Pt has a mild pharyngeal dysphagia with reduced anterior hyolaryngeal movement and discoordinated timing that results in aspiration before the swallow with thin and nectar thick liquids. Pt could not clear aspirates from his trachea despite cues for a volitional cough, but airway protection increased with use of a chin tuck to better contain liquids in his oral cavity/valleculae before the swallow. Interestingly, while all aspiration was silent, pt had consistent grunting/throat clearing after each sip of thin liquid while using a chin tuck, even though there was no airway compromise - making it a less reliable measure of airway protection clinically. Pt had better airway protection and good efficiency with all solid textures including mixed boluses. Given the above and the fact that pt has never had PNA or other respiratory issues, recommend regular diet and thin liquids with use of chin tuck. Pt would benefit from OP SLP f/u to increase utilization of strategies, provide education, and consider interventions to increase efficiency of cough.    Swallow Evaluation Recommendations       SLP Diet Recommendations: Regular solids;Thin liquid   Liquid Administration via: Cup;Straw   Medication Administration: Whole meds with puree   Supervision: Patient able to self feed;Intermittent supervision to cue for compensatory strategies   Compensations: Slow rate;Small sips/bites;Minimize environmental distractions;Chin tuck   Postural Changes: Seated upright at 90 degrees   Oral Care Recommendations: Oral care BID        Maxcine Ham 01/03/2018,2:03 PM   Maxcine Ham, M.A.  CCC-SLP [phone]

## 2018-01-06 ENCOUNTER — Ambulatory Visit: Payer: Medicare Other | Attending: Internal Medicine | Admitting: Speech Pathology

## 2018-01-06 ENCOUNTER — Encounter: Payer: Self-pay | Admitting: Speech Pathology

## 2018-01-06 ENCOUNTER — Other Ambulatory Visit: Payer: Self-pay

## 2018-01-06 DIAGNOSIS — I69391 Dysphagia following cerebral infarction: Secondary | ICD-10-CM | POA: Insufficient documentation

## 2018-01-06 NOTE — Patient Instructions (Addendum)
  Blow sets of 5 - 25 a day  Bring it back with you   Signs of Aspiration Pneumonia   . Chest pain/tightness . Fever (can be low grade) . Cough  o With foul-smelling phlegm (sputum) o With sputum containing pus or blood o With greenish sputum . Fatigue  . Shortness of breath  . Wheezing   **IF YOU HAVE THESE SIGNS, CONTACT YOUR DOCTOR OR GO TO THE EMERGENCY DEPARTMENT OR URGENT CARE AS SOON AS POSSIBLE**

## 2018-01-06 NOTE — Therapy (Signed)
[facility] [address] [place], [place], [address] Phone: [phone]   Fax:  [phone]  Speech Language Pathology Evaluation  Patient Details  Name: [name] MRN: [id number] Date of Birth: 1941/08/01 Referring Provider: Dr. Tyson Graham   Encounter Date: 01/06/2018  End of Session - 01/06/18 1242    Visit Number  1    Number of Visits  17    Date for SLP Re-Evaluation  03/03/18       Past Medical History:  Diagnosis Date  . CKD (chronic kidney disease), stage II   . CVA (cerebral infarction)    Left thalamic, right-sided weakness wih aphagia, right leg brace  . Dyslipidemia   . Elevated PSA    biopsy negative in 2008  . GERD (gastroesophageal reflux disease)   . Hypertension    without medication since 2006  . Major depression   . Seasonal rhinitis     Past Surgical History:  Procedure Laterality Date  . NASAL SEPTUM SURGERY    . VASECTOMY      There were no vitals filed for this visit.      SLP Evaluation [facility] - 01/06/18 1109      SLP Visit Information   SLP Received On  01/06/18    Referring Provider  Dr. Tyson Graham    Onset Date  2005; MBSS 01/03/18    Medical Diagnosis  CVA      Subjective   Patient/Family Stated Goal  To swallow as safe as possible      Cognition   Overall Cognitive Status  History of cognitive impairments - at baseline      Oral Motor/Sensory Function   Overall Oral Motor/Sensory Function  Appears within functional limits for tasks assessed      Motor Speech   Overall Motor Speech  Impaired at baseline       Subjective Assessment - 01/06/18 1108      Symptoms/Limitations   Subjective  "They said to tuck my chin"    Patient is accompained by:  Family member daughter      Pain Assessment   Currently in Pain?  No/denies      Prior Functional Status - 01/06/18 1109      Prior Functional Status   Cognitive/Linguistic Baseline  Baseline deficits     Baseline deficit details  memory, attention    Type of Home  House     Lives With  Family;Daughter      General - 01/06/18 1238      General Information   Previous Swallow Assessment  MBSS 01/03/18 - Pt has a mild pharyngeal dysphagia with reduced anterior hyolaryngeal movement and discoordinated timing that results in aspiration before the swallow with thin and nectar thick liquids. Pt could not clear aspirates from his trachea despite cues for a volitional cough, but airway protection increased with use of a chin tuck to better contain liquids in his oral cavity/valleculae before the swallow. Interestingly, while all aspiration was silent, pt had consistent grunting/throat clearing after each sip of thin liquid while using a chin tuck, even though there was no airway compromise - making it a less reliable measure of airway protection clinically. Pt had better airway protection and good efficiency with all solid textures including mixed boluses    Precautions: Small bites/sips, go slow, swallow prior bolus prior to taking next bite/sip, chin tuck with liquids       Oral Motor/Sensory Function - 01/06/18 1209  Oral Motor/Sensory Function   Overall Oral Motor/Sensory Function  Within functional limits       Thin Liquid - 01/06/18 1209      Thin Liquid   Thin Liquid  Impaired    Presentation  Cup    Pharyngeal  Phase Impairments  Throat Clearing - Immediate    Other Comments  throat clear/grunt after sips on MBSS did not correlate with aspiration therefore not a reliable indicator of airway compromise      Nectar thick liquid - 01/06/18 1210      Nectar Thick Liquid   Nectar Thick Liquid  Not tested      Honey Thick Liquid - 01/06/18 1210      Honey Thick Liquid   Honey Thick Liquid  Not tested       Solid - 01/06/18 1210      Solid   Solid  Within functional limits    Presentation  Self Fed    Other Comments  Impulsive, taking half of cereal bar at a time           SLP Education - 01/06/18 1208    Education provided  Yes    Education Details  s/s of aspiration pna, EMST, swallow precautions,     Person(s) Educated  Patient;Child(ren)    Methods  Explanation;Demonstration;Verbal cues;Handout    Comprehension  Verbalized understanding;Returned demonstration;Verbal cues required;Need further instruction       SLP Short Term Goals - 01/06/18 1226      SLP SHORT TERM GOAL #1   Title  Pt will follow swallow precautions with occasional min A over 3 sessions.    Time  4    Period  Weeks    Status  New      SLP SHORT TERM GOAL #2   Title  Pt will complete 25 repetitions of RMT with self reported effort of 7 or less on a scale of 10 with 90% accuracy over 3 sessions    Time  4    Period  Weeks    Status  New       SLP Long Term Goals - 01/06/18 1228      SLP LONG TERM GOAL #1   Title  Pt will follow swallow precautions with rare min A in non distracting environment with rare min A over 2 sessions    Time  8    Period  Weeks    Status  New      SLP LONG TERM GOAL #2   Title  Pt will improve glottal closureto reduce penetration/aspiration duirng the swalow as measured by reduced s/s of aspiration with regular/thin.     Time  8    Period  Weeks    Status  New       Plan - 01/06/18 1232    Clinical Impression Statement  [name], a 77 y.o. male with h/o chronic pharyngeal dysphagia since CVA 2005 is referred for dysphagia therapy s/p MBSS 01/03/18. Pt referred for training in compensations and to improve effectiveness of cough on clearing penetration/aspiration.  Today, [name] required ongoing modeling and verbal cues to clear his mouth prior to taking next bite/sip due to impulsiveness. He also required onging modeling and verbal cues to consistently carryover  chin tuck with single sips of liquid.  Maximum inspiratory pressure was 68 cm H2O, with 89.66 being maximum limit and 50.99 being  lower limit (LLN) for pt's age. Maximum  expiratory pressure was 100cmH2O with age related  maximum MEP is 112.58 and 55.58 being LLN. [name] was trained in EMST 150 set at 110cmH2O, with pt rating effort at 7/10.  Due to time constraints, pt was not trained in IMST. Daughter reports that pt "shovels in" food before he swallows prior bolus and that he frequently takes very large boluses of solids. I instructed her to cue pt to put his fork down in between bites. Due to  reduced cognition, pt will rerquire ongoing cueing for swallow precautions. I recommend skilled ST to reduce risk of aspiration pna and maximize airway protection during meals.         Patient will benefit from skilled therapeutic intervention in order to improve the following deficits and impairments:   Dysphagia as late effect of cerebrovascular accident (CVA)    Problem List Patient Active Problem List   Diagnosis Date Noted  . Hemiplegia affecting dominant side, post-stroke (HCC) 03/07/2017  . Spastic hemiplegia affecting dominant side (HCC) 02/07/2017  . 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 Right foot drop 12/02/2016  . 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 Depression due to dementia 01/28/2015  . 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 Hemiplegia following CVA (cerebrovascular accident) (HCC) 05/31/2014  . 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 Left pontine stroke (HCC) 05/31/2014  . MCI (mild cognitive impairment) with memory loss 05/31/2014    Kaleel Schmieder, Radene Journey MS, CCC-SLP 01/06/2018, 12:42 PM  [facility] [address] [place], [place], [address] Phone: [phone]   Fax:  [phone]  Name: [name] MRN: [id number] Date of Birth: 1941-05-05

## 2018-01-10 ENCOUNTER — Ambulatory Visit: Payer: Medicare Other

## 2018-01-10 DIAGNOSIS — I69391 Dysphagia following cerebral infarction: Secondary | ICD-10-CM | POA: Diagnosis not present

## 2018-01-10 NOTE — Therapy (Signed)
[facility] [address] [place], [place], [address] Phone: [phone]   Fax:  [phone]  Speech Language Pathology Treatment  Patient Details  Name: [name] MRN: [id number] Date of Birth: [birth year] Referring Provider: Dr. Tyson Dense   Encounter Date: 01/10/2018  End of Session - 01/10/18 1634    Visit Number  2    Number of Visits  17    Date for SLP Re-Evaluation  03/03/18    SLP Start Time  1540    SLP Stop Time   1615    SLP Time Calculation (min)  35 min    Activity Tolerance  Patient tolerated treatment well       Past Medical History:  Diagnosis Date  . CKD (chronic kidney disease), stage II   . CVA (cerebral infarction)    Left thalamic, right-sided weakness wih aphagia, right leg brace  . Dyslipidemia   . Elevated PSA    biopsy negative in 2008  . GERD (gastroesophageal reflux disease)   . Hypertension    without medication since 2006  . Major depression   . Seasonal rhinitis     Past Surgical History:  Procedure Laterality Date  . NASAL SEPTUM SURGERY    . VASECTOMY      There were no vitals filed for this visit.  Subjective Assessment - 01/10/18 1540    Subjective  Pt checked in at 1540 for 1530 appt.    Patient is accompained by:  -- daughter    Currently in Pain?  No/denies            ADULT SLP TREATMENT - 01/10/18 1550      General Information   Behavior/Cognition  Pleasant mood;Impulsive;Alert      Treatment Provided   Treatment provided  Dysphagia      Dysphagia Treatment   Temperature Spikes Noted  No    Respiratory Status  Room air    Oral Cavity - Dentition  Adequate natural dentition    Treatment Methods  Skilled observation;Therapeutic exercise;Compensation strategy training;Patient/caregiver education    Patient observed directly with PO's  Yes    Type of PO's observed  Dysphagia 3 (soft);Thin liquids    Feeding  Needs assist    Liquids provided  via  Cup    Oral Phase Signs & Symptoms  -- none noted    Pharyngeal Phase Signs & Symptoms  Audible swallow 2/8 boluses    Type of cueing  Verbal;Visual    Amount of cueing  -- consistent (80% of more opportunities)    Other treatment/comments  Pt daughter reported pt "going really hard" with the EMT and blowing as hard as he can as long as he can without any airflow through the EMST 150- and becoming light headed. SLP re-trained pt and daughter in "birthday cake blow" - hard but fast blowing, without the device. Pt req'd initial usual mod cues but faded to SBA for "birthday cake blow" without the device. At previous setting set during eval pt was unable to achieve any airflow through EMST so SLP decr'd setting to 90cm H2O and pt blew 5/5 with effort level 5/10 (10=max). SLP turned clockwise 1/4 turn and pt blew 4/5 with effort level reported as 8/10 effort. SLP turned counter clockwise approx 1/12 a turn and pt reported effort level 7 with 3/5 "bike pump" sound. SLP then made a mark on the device aligning with the guide screw and explained to daughter this technique in case device  setting mistakenly gets changed for some reason. Pt told SLP his precautions as chin down and small bites. SLP observed pt and he required cues as above in flowchart.Marland KitchenSLP also told pt/daughter no distractions while eating (TV).      Assessment / Recommendations / Plan   Plan  Continue with current plan of care      Progression Toward Goals   Progression toward goals  Progressing toward goals       SLP Education - 01/10/18 1633    Education provided  Yes    Education Details  EMST blow technique, swallow precautions, no distractions during meals    Person(s) Educated  Patient;Child(ren)    Methods  Explanation;Demonstration;Verbal cues    Comprehension  Verbalized understanding;Returned demonstration;Verbal cues required;Need further instruction       SLP Short Term Goals - 01/10/18 1636      SLP SHORT TERM GOAL  #1   Title  Pt will follow swallow precautions with occasional min A over 3 sessions.    Time  4    Period  Weeks    Status  On-going      SLP SHORT TERM GOAL #2   Title  Pt will complete 25 repetitions of RMT with self reported effort of 7 or less on a scale of 10 with 90% accuracy over 3 sessions    Time  4    Period  Weeks    Status  On-going       SLP Long Term Goals - 01/10/18 1636      SLP LONG TERM GOAL #1   Title  Pt will follow swallow precautions with rare min A in non distracting environment with rare min A over 2 sessions    Time  8    Period  Weeks    Status  On-going      SLP LONG TERM GOAL #2   Title  Pt will improve glottal closureto reduce penetration/aspiration duirng the swalow as measured by reduced s/s of aspiration with regular/thin.     Time  8    Period  Weeks    Status  On-going       Plan - 01/10/18 1634    Clinical Impression Statement  Today, [name] required consistent cues during POs to take small sips due to impulsiveness, and for chin tuck. Due to reduced cognition, pt will require ongoing cueing for swallow precautions. I recommend skilled ST to reduce risk of aspiration pna and maximize airway protection during meals.         Patient will benefit from skilled therapeutic intervention in order to improve the following deficits and impairments:   Dysphagia as late effect of cerebrovascular accident (CVA)    Problem List Patient Active Problem List   Diagnosis Date Noted  . Hemiplegia affecting dominant side, post-stroke (HCC) 03/07/2017  . Spastic hemiplegia affecting dominant side (HCC) 02/07/2017  . 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 Right foot drop 12/02/2016  . 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 Depression due to dementia 01/28/2015  . 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 Amnestic MCI (mild cognitive impairment with memory loss)  01/28/2015  . Hemiplegia following CVA (cerebrovascular accident) (HCC) 05/31/2014  . 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 Left pontine stroke (HCC) 05/31/2014  . MCI (mild cognitive impairment) with memory loss 05/31/2014    [facility] ,MS, CCC-SLP  01/10/2018, 4:37 PM  [place]  [facility] [address] [place], [place], [address] Phone: [phone]   Fax:  [phone]   Name: [name] MRN: [id number] Date of Birth: 24-Feb-1941

## 2018-01-12 ENCOUNTER — Ambulatory Visit: Payer: Medicare Other | Admitting: Speech Pathology

## 2018-01-12 DIAGNOSIS — I69391 Dysphagia following cerebral infarction: Secondary | ICD-10-CM | POA: Diagnosis not present

## 2018-01-12 NOTE — Patient Instructions (Signed)
  Do 3x a day  Breathe first   EEE-EEE-EEE in high squeaky pitch - 15x  Half swallow - swallow, hold your voice box up for 5 seconds, then relax - 10x  Pitch glide "Gus Puma" start low and go high, breathe then start high go low - 15x

## 2018-01-12 NOTE — Therapy (Signed)
[facility] [address] [place], [place], [address] Phone: [phone]   Fax:  [phone]  Speech Language Pathology Treatment  Patient Details  Name: [name] MRN: [id number] Date of Birth: June 16, 1941 Referring Provider: Dr. Tyson Dense   Encounter Date: 01/12/2018  End of Session - 01/12/18 1439    Visit Number  3    Number of Visits  17    Date for SLP Re-Evaluation  03/03/18    SLP Start Time  1401    SLP Stop Time   1429    SLP Time Calculation (min)  28 min    Activity Tolerance  Patient tolerated treatment well       Past Medical History:  Diagnosis Date  . CKD (chronic kidney disease), stage II   . CVA (cerebral infarction)    Left thalamic, right-sided weakness wih aphagia, right leg brace  . Dyslipidemia   . Elevated PSA    biopsy negative in 2008  . GERD (gastroesophageal reflux disease)   . Hypertension    without medication since 2006  . Major depression   . Seasonal rhinitis     Past Surgical History:  Procedure Laterality Date  . NASAL SEPTUM SURGERY    . VASECTOMY      There were no vitals filed for this visit.         ADULT SLP TREATMENT - 01/12/18 1434      General Information   Behavior/Cognition  Pleasant mood;Impulsive;Alert      Treatment Provided   Treatment provided  Dysphagia      Dysphagia Treatment   Temperature Spikes Noted  No    Respiratory Status  Room air    Oral Cavity - Dentition  Adequate natural dentition    Treatment Methods  Skilled observation;Therapeutic exercise;Compensation strategy training;Patient/caregiver education    Patient observed directly with PO's  Yes    Type of PO's observed  Thin liquids    Feeding  Able to feed self    Liquids provided via  -- bottle    Type of cueing  Verbal;Tactile;Visual    Amount of cueing  Moderate    Other treatment/comments  Initiated training in hyolaryngeal elevation per MBSS with pitch raise,  pitch glied and Mendelson. Pt required usual mod A initially, with occasional min A as session progressed. Pt compleiting EMT with rare min A. Pt utilized small sip and chin tuck with occasional min verbal and modeling cues. Daughter reports pt continues to require cues for reduced boluse size and reduced rate      Pain Assessment   Pain Assessment  No/denies pain      Dysphagia Recommendations   Diet recommendations  Dysphagia 3 (mechanical soft);Thin liquid    Liquids provided via  Cup    Medication Administration  Whole meds with puree    Supervision  Patient able to self feed    Compensations  Slow rate;Small sips/bites;Effortful swallow    Postural Changes and/or Swallow Maneuvers  Chin tuck      Progression Toward Goals   Progression toward goals  Progressing toward goals       SLP Education - 01/12/18 1437    Education provided  Yes    Education Details  HEP for dysphagia    Person(s) Educated  Patient;Child(ren)    Methods  Explanation;Demonstration;Verbal cues;Tactile cues;Handout    Comprehension  Verbalized understanding;Returned demonstration;Verbal cues required;Need further instruction       SLP Short Term Goals - 01/12/18  1439      SLP SHORT TERM GOAL #1   Title  Pt will follow swallow precautions with occasional min A over 3 sessions.    Time  4    Period  Weeks    Status  On-going      SLP SHORT TERM GOAL #2   Title  Pt will complete 25 repetitions of RMT with self reported effort of 7 or less on a scale of 10 with 90% accuracy over 3 sessions    Time  4    Period  Weeks    Status  On-going       SLP Long Term Goals - 01/12/18 1439      SLP LONG TERM GOAL #1   Title  Pt will follow swallow precautions with rare min A in non distracting environment with rare min A over 2 sessions    Time  8    Period  Weeks    Status  On-going      SLP LONG TERM GOAL #2   Title  Pt will improve glottal closureto reduce penetration/aspiration duirng the swalow as  measured by reduced s/s of aspiration with regular/thin.     Time  8    Period  Weeks    Status  On-going       Plan - 01/12/18 1438    Clinical Impression Statement  Today, [name] required consistent cues during POs to take small sips due to impulsiveness, and for chin tuck. Due to reduced cognition, pt will require ongoing cueing for swallow precautions. I recommend skilled ST to reduce risk of aspiration pna and maximize airway protection during meals.      Speech Therapy Frequency  2x / week    Treatment/Interventions  Aspiration precaution training;Environmental controls;Cueing hierarchy;Other (comment);Compensatory strategies;Diet toleration management by SLP;Internal/external aids;Patient/family education;Pharyngeal strengthening exercises;SLP instruction and feedback    Potential to Achieve Goals  Fair    Potential Considerations  Previous level of function    Consulted and Agree with Plan of Care  Patient;Family member/caregiver    Family Member Consulted  daughter, Gae Dry       Patient will benefit from skilled therapeutic intervention in order to improve the following deficits and impairments:   Dysphagia as late effect of cerebrovascular accident (CVA)    Problem List Patient Active Problem List   Diagnosis Date Noted  . Hemiplegia affecting dominant side, post-stroke (HCC) 03/07/2017  . Spastic hemiplegia affecting dominant side (HCC) 02/07/2017  . 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 Right foot drop 12/02/2016  . 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 Depression due to dementia 01/28/2015  . 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 Hemiplegia following CVA (cerebrovascular accident) (HCC) 05/31/2014  . 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 Left pontine stroke (HCC) 05/31/2014   . MCI (mild cognitive impairment) with memory loss 05/31/2014    Trena Dunavan, Radene Journey MS, CCC-SLP 01/12/2018, 2:40 PM  [facility] [address] [place], [place], [address] Phone: [phone]   Fax:  [phone]   Name: [name] MRN: [id number] Date of Birth: 01-19-1941

## 2018-01-17 ENCOUNTER — Ambulatory Visit: Payer: Medicare Other

## 2018-01-17 DIAGNOSIS — I69391 Dysphagia following cerebral infarction: Secondary | ICD-10-CM | POA: Diagnosis not present

## 2018-01-17 NOTE — Therapy (Signed)
[facility] [address] [place], [place], [address] Phone: [phone]   Fax:  [phone]  Speech Language Pathology Treatment  Patient Details  Name: [name] MRN: [id number] Date of Birth: 05/31/1941 Referring Provider: Dr. Tyson Dense   Encounter Date: 01/17/2018  End of Session - 01/17/18 1425    Visit Number  4    Number of Visits  17    Date for SLP Re-Evaluation  03/03/18    SLP Start Time  1323    SLP Stop Time   1406    SLP Time Calculation (min)  43 min    Activity Tolerance  Patient tolerated treatment well       Past Medical History:  Diagnosis Date  . CKD (chronic kidney disease), stage II   . CVA (cerebral infarction)    Left thalamic, right-sided weakness wih aphagia, right leg brace  . Dyslipidemia   . Elevated PSA    biopsy negative in 2008  . GERD (gastroesophageal reflux disease)   . Hypertension    without medication since 2006  . Major depression   . Seasonal rhinitis     Past Surgical History:  Procedure Laterality Date  . NASAL SEPTUM SURGERY    . VASECTOMY      There were no vitals filed for this visit.  Subjective Assessment - 01/17/18 1328    Subjective  Pt daughter states pt is having trouble recalling to do the HEP.    Patient is accompained by:  Family member daughter    Currently in Pain?  No/denies            ADULT SLP TREATMENT - 01/17/18 1328      General Information   Behavior/Cognition  Alert;Cooperative;Impulsive;Requires cueing      Treatment Provided   Treatment provided  Dysphagia      Dysphagia Treatment   Temperature Spikes Noted  No    Respiratory Status  Room air    Oral Cavity - Dentition  Adequate natural dentition    Treatment Methods  Skilled observation    Patient observed directly with PO's  Yes    Type of PO's observed  Thin liquids;Dysphagia 3 (soft)    Feeding  Able to feed self    Liquids provided via  Cup    Pharyngeal  Phase Signs & Symptoms  Audible swallow;Immediate cough    Type of cueing  Verbal;Visual    Amount of cueing  Moderate    Other treatment/comments  SLP assessed pt's procedure and accuracy with dysphagia HEP. Pt req'd min A on 2/3 hyolaryngeal elevation exercises. With expiratory muscle training pt was able to make "bike pump" sound with 23/25 reps, SLP incr'd pressure 1/4 turn and pt had difficulty wiht 3/5 reps ("squauk" sound). SLP turned counter clockwise 1/12 rotation (one "number" turn on a clock - at roughly 100cm H2O) and pt rated effort 7/10 on two sets of reps. With POs, see above, however pt req'd consistent mod cues for small sips and proper chin tuck (not tucked enough). Pt coughed with larger sips and less-obvious chin tuck. Daughter questioned whether straws would help consistently decr sip size - SLP educated that straw sips are usually larger than cup sips. Daughter also questioning how long she will need to consistently cue pt for swallow precautions. I told daughter she will need to consistently cue for 4-6 weeks and then see if pt's need for cueing decreases or not.       Dysphagia  Recommendations   Diet recommendations  Dysphagia 3 (mechanical soft);Thin liquid    Liquids provided via  Cup    Medication Administration  Whole meds with puree    Supervision  Patient able to self feed    Compensations  Slow rate;Small sips/bites;Effortful swallow    Postural Changes and/or Swallow Maneuvers  Chin tuck      Progression Toward Goals   Progression toward goals  Progressing toward goals       SLP Education - 01/17/18 1424    Education provided  Yes    Education Details  cup sips are generally smaller than straw sips, daughter will need to cue pt consistently due to pt's cognitive status    Person(s) Educated  Patient;Child(ren)    Methods  Explanation;Demonstration;Verbal cues    Comprehension  Verbalized understanding;Returned demonstration;Verbal cues required;Need further  instruction       SLP Short Term Goals - 01/17/18 1427      SLP SHORT TERM GOAL #1   Title  Pt will follow swallow precautions with occasional min A over 3 sessions.    Time  3    Period  Weeks    Status  On-going      SLP SHORT TERM GOAL #2   Title  Pt will complete 25 repetitions of RMT with self reported effort of 7 or less on a scale of 10 with 90% accuracy over 3 sessions    Time  3    Period  Weeks    Status  On-going       SLP Long Term Goals - 01/17/18 1427      SLP LONG TERM GOAL #1   Title  Pt will follow swallow precautions with rare min A in non distracting environment with rare min A over 2 sessions    Time  7    Period  Weeks    Status  On-going      SLP LONG TERM GOAL #2   Title  Pt will improve glottal closureto reduce penetration/aspiration duirng the swalow as measured by reduced s/s of aspiration with regular/thin.     Time  7    Period  Weeks    Status  On-going       Plan - 01/17/18 1425    Clinical Impression Statement  Today, Mr. Forget required consistent cues during POs to take small sips due to impulsiveness, and to place chin in a tucked position, and in an appropriate tuck position. Due to reduced cognition, pt will require ongoing cueing for swallow precautions. SLP educated re: cup sips vs. straw sips, and extent of timing with pt precautions (see "PT education" for details). I recommend skilled ST to reduce risk of aspiration pna and maximize airway protection during meals.      Speech Therapy Frequency  2x / week    Duration  -- 8 weeks    Treatment/Interventions  Aspiration precaution training;Environmental controls;Cueing hierarchy;Other (comment);Compensatory strategies;Diet toleration management by SLP;Internal/external aids;Patient/family education;Pharyngeal strengthening exercises;SLP instruction and feedback    Potential to Achieve Goals  Fair    Potential Considerations  Previous level of function       Patient will benefit from  skilled therapeutic intervention in order to improve the following deficits and impairments:   Dysphagia as late effect of cerebrovascular accident (CVA)    Problem List Patient Active Problem List   Diagnosis Date Noted  . Hemiplegia affecting dominant side, post-stroke (HCC) 03/07/2017  . Spastic hemiplegia affecting dominant side (HCC) 02/07/2017  .  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 Right foot drop 12/02/2016  . 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 Depression due to dementia 01/28/2015  . 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 Hemiplegia following CVA (cerebrovascular accident) (HCC) 05/31/2014  . 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 Left pontine stroke (HCC) 05/31/2014  . MCI (mild cognitive impairment) with memory loss 05/31/2014    [facility] ,MS, CCC-SLP  01/17/2018, 2:28 PM  [place] [facility] [address] [place], [place], [address] Phone: [phone]   Fax:  [phone]   Name: [name] MRN: [id number] Date of Birth: 01-23-1941

## 2018-01-20 ENCOUNTER — Ambulatory Visit: Payer: Medicare Other

## 2018-01-20 DIAGNOSIS — I69391 Dysphagia following cerebral infarction: Secondary | ICD-10-CM | POA: Diagnosis not present

## 2018-01-23 NOTE — Therapy (Signed)
[facility] [address] [place], [place], [address] Phone: [phone]   Fax:  [phone]  Speech Language Pathology Treatment  Patient Details  Name: [name] MRN: [id number] Date of Birth: 1941/03/25 Referring Provider: Dr. Tyson Dense   Encounter Date: 01/20/2018  End of Session - 01/23/18 0932    Visit Number  5    Number of Visits  17    Date for SLP Re-Evaluation  03/03/18    SLP Start Time  0848    SLP Stop Time   0930    SLP Time Calculation (min)  42 min    Activity Tolerance  Patient tolerated treatment well       Past Medical History:  Diagnosis Date  . CKD (chronic kidney disease), stage II   . CVA (cerebral infarction)    Left thalamic, right-sided weakness wih aphagia, right leg brace  . Dyslipidemia   . Elevated PSA    biopsy negative in 2008  . GERD (gastroesophageal reflux disease)   . Hypertension    without medication since 2006  . Major depression   . Seasonal rhinitis     Past Surgical History:  Procedure Laterality Date  . NASAL SEPTUM SURGERY    . VASECTOMY      There were no vitals filed for this visit.         ADULT SLP TREATMENT - 01/23/18 0001      General Information   Behavior/Cognition  Alert;Cooperative;Impulsive;Requires cueing      Dysphagia Treatment   Temperature Spikes Noted  No    Respiratory Status  Room air    Oral Cavity - Dentition  Adequate natural dentition    Treatment Methods  Skilled observation;Patient/caregiver education;Therapeutic exercise    Patient observed directly with PO's  Yes    Type of PO's observed  Thin liquids;Dysphagia 3 (soft)    Feeding  Able to feed self    Liquids provided via  Cup    Pharyngeal Phase Signs & Symptoms  Audible swallow;Immediate cough    Type of cueing  Verbal;Visual    Amount of cueing  Minimal    Other treatment/comments  Pt req'd min A with chin-tuck procedure with POs when SLP talking with pt  (cough occurred x1 due to larger sip size/no chin tuck).  SLP reminded pt/daughter re NO DISTRACTIONS (TV, conversation, etc) during meals would allow pt to focus on and complete chin-tuck. When no distractions, pt did not exhibit overt s/s aspiration. Pt completion with HEP accuracy was appropriate. SLP postulates pt has been more consistnet with HEP since last session.       Dysphagia Recommendations   Diet recommendations  Dysphagia 3 (mechanical soft);Thin liquid    Liquids provided via  Cup    Medication Administration  Whole meds with puree    Supervision  Patient able to self feed    Compensations  Slow rate;Small sips/bites;Effortful swallow    Postural Changes and/or Swallow Maneuvers  Chin tuck      Progression Toward Goals   Progression toward goals  Progressing toward goals         SLP Short Term Goals - 01/23/18 0936      SLP SHORT TERM GOAL #1   Title  Pt will follow swallow precautions with occasional min A over 3 sessions.    Baseline  01-20-18    Time  3    Period  Weeks    Status  On-going  SLP SHORT TERM GOAL #2   Title  Pt will complete 25 repetitions of RMT with self reported effort of 7 or less on a scale of 10 with 90% accuracy over 3 sessions    Baseline  01-20-18    Time  3    Period  Weeks    Status  On-going       SLP Long Term Goals - 01/23/18 1610      SLP LONG TERM GOAL #1   Title  Pt will follow swallow precautions with rare min A in non distracting environment with rare min A over 2 sessions    Baseline  01-20-18    Time  7    Period  Weeks    Status  On-going      SLP LONG TERM GOAL #2   Title  Pt will improve glottal closureto reduce penetration/aspiration duirng the swalow as measured by reduced s/s of aspiration with regular/thin.     Time  7    Period  Weeks    Status  On-going       Plan - 01/23/18 0933    Clinical Impression Statement  Today, Mr. Varricchio required cues during POs to place chin in a tucked position and for  appropriate sip sizes when distracted with conversation with SLP. Pt was independent after this. Due to reduced cognition, pt will likely require ongoing cueing for swallow precautions in his natural/home environment. If pt maintains level of awareness with precautions and good success with HEP SLP suggests reduction to x1/week beginning the next ST session. Pt may be sensing aspirates moreso now than during modified (MBSS) due to decr in frequency of aspiration and thus now decr'd frequency of overstimulation of those receptors in pharyngeal/laryngeal cavities. I recommend skilled ST to reduce risk of aspiration pna and maximize airway protection during meals.      Speech Therapy Frequency  2x / week    Duration  -- 8 weeks    Treatment/Interventions  Aspiration precaution training;Environmental controls;Cueing hierarchy;Other (comment);Compensatory strategies;Diet toleration management by SLP;Internal/external aids;Patient/family education;Pharyngeal strengthening exercises;SLP instruction and feedback    Potential to Achieve Goals  Fair    Potential Considerations  Previous level of function       Patient will benefit from skilled therapeutic intervention in order to improve the following deficits and impairments:   Dysphagia as late effect of cerebrovascular accident (CVA)    Problem List Patient Active Problem List   Diagnosis Date Noted  . Hemiplegia affecting dominant side, post-stroke (HCC) 03/07/2017  . Spastic hemiplegia affecting dominant side (HCC) 02/07/2017  . 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 Right foot drop 12/02/2016  . 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 Depression due to dementia 01/28/2015  . 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 Hemiplegia following CVA (cerebrovascular accident) (HCC) 05/31/2014  .  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 Left pontine stroke (HCC) 05/31/2014  . MCI (mild cognitive impairment) with memory loss 05/31/2014    [facility] ,MS, CCC-SLP  01/23/2018, 9:38 AM  [facility] [address] [place], [place], [address] Phone: [phone]   Fax:  [phone]   Name: [name] MRN: [id number] Date of Birth: 15-Dec-1940

## 2018-02-03 ENCOUNTER — Ambulatory Visit: Payer: Medicare Other

## 2018-02-03 ENCOUNTER — Encounter

## 2018-02-10 ENCOUNTER — Ambulatory Visit: Payer: Medicare Other

## 2018-02-14 ENCOUNTER — Encounter: Payer: Medicare Other | Admitting: Speech Pathology

## 2018-02-23 ENCOUNTER — Encounter: Payer: Medicare Other | Admitting: Speech Pathology

## 2018-02-28 ENCOUNTER — Encounter: Payer: Medicare Other | Admitting: Speech Pathology

## 2018-03-02 ENCOUNTER — Encounter: Payer: Medicare Other | Admitting: Speech Pathology

## 2018-03-07 ENCOUNTER — Encounter: Payer: Medicare Other | Admitting: Speech Pathology

## 2018-03-13 ENCOUNTER — Ambulatory Visit: Payer: Medicare Other | Admitting: Neurology

## 2018-04-03 ENCOUNTER — Ambulatory Visit: Payer: Medicare Other | Admitting: Neurology

## 2018-04-03 ENCOUNTER — Encounter: Payer: Self-pay | Admitting: Neurology

## 2018-04-03 VITALS — BP 140/82 | HR 87 | Ht 70.0 in | Wt 162.0 lb

## 2018-04-03 DIAGNOSIS — G3184 Mild cognitive impairment, so stated: Secondary | ICD-10-CM

## 2018-04-03 DIAGNOSIS — G4719 Other hypersomnia: Secondary | ICD-10-CM

## 2018-04-03 DIAGNOSIS — I6350 Cerebral infarction due to unspecified occlusion or stenosis of unspecified cerebral artery: Secondary | ICD-10-CM | POA: Diagnosis not present

## 2018-04-03 DIAGNOSIS — R0683 Snoring: Secondary | ICD-10-CM

## 2018-04-03 DIAGNOSIS — G4710 Hypersomnia, unspecified: Secondary | ICD-10-CM | POA: Diagnosis not present

## 2018-04-03 DIAGNOSIS — F519 Sleep disorder not due to a substance or known physiological condition, unspecified: Secondary | ICD-10-CM | POA: Insufficient documentation

## 2018-04-03 DIAGNOSIS — F5109 Other insomnia not due to a substance or known physiological condition: Secondary | ICD-10-CM | POA: Diagnosis not present

## 2018-04-03 DIAGNOSIS — G4730 Sleep apnea, unspecified: Secondary | ICD-10-CM

## 2018-04-03 MED ORDER — MEMANTINE HCL 10 MG PO TABS: 10.0000 mg | ORAL_TABLET | Freq: Two times a day (BID) | ORAL | 5 refills | Status: DC

## 2018-04-03 NOTE — Patient Instructions (Signed)
Hypersomnia Hypersomnia is when you feel extremely tired during the day even though you're getting plenty of sleep at night. You may need to take naps during the day, and you may also be extremely difficult to wake up when you are sleeping. What are the causes? The cause of your hypersomnia may not be known. Hypersomnia may be caused by:  Medicines.  Sleep disorders, such as narcolepsy.  Trauma or injury to your head or nervous system.  Using drugs or alcohol.  Tumors.  Medical conditions, such as depression or hypothyroidism.  Genetics.  What are the signs or symptoms? The main symptoms of hypersomnia include:  Feeling extremely tired throughout the day.  Being very difficult to wake up.  Sleeping for longer and longer periods.  Taking naps throughout the day.  Other symptoms may include:  Feeling: ? Restless. ? Annoyed. ? Anxious. ? Low energy.  Having difficulty: ? Remembering. ? Speaking. ? Thinking.  Losing your appetite.  Experiencing hallucinations.  How is this diagnosed? Hypersomnia may be diagnosed by:  Medical history and physical exam. This will include a sleep history.  Completing sleep logs.  Tests may also be done, such as: ? Polysomnography. ? Multiple sleep latency test (MSLT).  How is this treated? There is no cure for hypersomnia, but treatment can be very effective in helping manage the condition. Treatment may include:  Lifestyle and sleeping strategies to help cope with the condition.  Stimulant medicines.  Treating any underlying causes of hypersomnia.  Follow these instructions at home:  Take medicines only as directed by your health care provider.  Schedule short naps for when you feel sleepiest during the day. Tell your employer or teachers that you have hypersomnia. You may be able to adjust your schedule to include time for naps.  Avoid drinking alcohol or caffeinated beverages.  Do not eat a heavy meal before  bedtime. Eat at about the same times every day.  Do not drive or operate heavy machinery if you are sleepy.  Do not swim or go out on the water without a life jacket.  If possible, adjust your schedule so that you do not have to work or be active at night.  Keep all follow-up visits as directed by your health care provider. This is important. Contact a health care provider if:  You have new symptoms.  Your symptoms get worse. Get help right away if: You have serious thoughts of hurting yourself or someone else. This information is not intended to replace advice given to you by your health care provider. Make sure you discuss any questions you have with your health care provider. Document Released: 08/13/2002 Document Revised: 01/29/2016 Document Reviewed: 03/28/2014 Elsevier Interactive Patient Education  2018 Elsevier Inc.  

## 2018-04-03 NOTE — Progress Notes (Signed)
Provider:  Melvyn Novas, M D  Referring Provider: [name], MD Primary Care Physician:  [name], MD  Chief Complaint  Patient presents with  . Follow-up    pt with daughter, rm 59. pt daughter will would like to discuss CBD oil use.pt daughter states he snores and gasping for air in sleep. wanted to questions a sleep study.    Interval history from 03 April 2018, patent and daughter here today.  I am again very happy to see that [name] is walking without a cane, he is using a walker, he has been gaining mobility and range of motion, he appears much more alert and younger than he looked years ago.  Dr. Doroteo Bradford had followed him for possible Botox needs, he continues to live with his daughter [name] and her husband.  He has even done well with the heat.  Will be performed today another Mini-Mental Status Examination and he scored 23 out of 30 points, did 26 out of 30 the last time. I adjusted his MMSE and excluded the writing and drawing. 23-27 points.  He wants to discuss the sleep study - he snores loudly and family hears him gasping. He is sleepier in daytime / and she reports having trouble to get him woken up in AM- he may stay in bed until 1 PM.     Interval history from 13 September 2017, [name] and his daughter [name] I seen here today.  The patient is using his walker but had experienced a fall 2 days ago after not having had any falls in 3 or 4 months.  The fall happened in the bathroom while urinating, standing and not holding on to a grab bar. He has visited a senior center and enjoyed the visits. He is living with [name] and her husband. He goes swimming ! He plays cards! He is active and looks well.  MOCA today after MMSE last visit was at a  very high score 26/30.   Interval history from 03/07/2017, [name] and his daughter [name] are seen here today. The patient is no longer using a 3 pronged cane but uses a walker which allows him to walk straighter with  better body position and symmetry and keeps him from falling. His mobility also has improved with  AFO- and he responded well to a nerve block Dr Doroteo Bradford will also evaluate the shoulder for possible BoTox needs. He is not nearly as tense since using the walker and replacing the cane.  PT and OT continue .  In comparison to previous visits his blood pressure is improved, his weight has improved, his ability to walk and exercise tolerance has greatly improved. He is alert and no longer as sleepy. I will follow Dr Wynn Banker observation.     HPI: Interval history from Dec 06, 2016. Mrs. Graham died in early 02/12/18and [name] has now moved in with his daughter [name]. She reports that he is doing well with the changes in environment and he does look well groomed and well fed and alert. We performed today Mini-Mental Status Examination in which she scored 26 out of 30 points this has to be seen in context with his right hand dominant hemiparesis, he is still able to draw also with a tremor, he was able to draw clock face and right a sentence. He generated 7 words. I would therefore adjust his test to 27 out of 30 points. [name] has noticed that her father snores  horribly, and he reports feeling sleepy all the time. 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 I will order a sleep test for him. His right foot needs a new AFT, and he developed some calluses. I will send him to a PT evaluation and take their recommendations to [facility].    [name] is a 77 y.o. male  Is seen here after a prolonged time, I used to follow this stroke patient during his hospitalization 10 years ago. His visit today was arranged by his wife, his PCP is [name], [name] was referred for a memory evaluation .Because of his history of a complicated CVA ,brain stem stroke syndromes and hemiparesis , a vascular effect has to be taken into  account. [name] father had late onset dementia and personality changes and [name] sees some parallels in his behavior. He has become more aggressive, easier agitated, which was not part of his personality before this year. The couple celebreated their Maxie Barb Anniversary in 2014,  active in the [employer] in [place] . Mr. Quant performs household chores and the couple spends time with the grandchildren now 65 and 64.  He has become impulsive, and sometimes" downright mean ". He still uses a urinary cath. Has right hand weakness , leg stiffness, left sensory loss, right facial weakness -hold cane in the left hand. Dysphonia, aphasia.  Mr Graham is falling more often, his skin is scabbed. He fell rising while pulling up on his cane. His walker has a seat and is used outside.  His other cane will have 3 prongs. He sleeps a lot. His wife started peritoneal dialysis.  2 times a day. Needs to wear a diaper for bowel accident. Is very happy with Dr. Abel Presto.  She has a sleep apnea evaluation tonight. Elizah snores terribly, even before his brainstem stroke, dependent on supine position.   I have  the pleasure today on 06/02/2016 to see [name] again, today in the presence of his daughter [name]. Mr. Montagna had become extremely daytime sleepy over the last couple of months while his wife's health has declined to the level where she could not safely ambulate for longer distance, and she is probably no longer safe to drive. She failed peritoneal dialysis, became extremely dehydrated and was insufficiently dialyzed. She has not a switch to hemodialysis but still is not thriving. For this reason the novel take over the organization of home health care which she initiated already. And also the healthcare power of attorney.She has requested FMLA. In the meantime home health care has been of significant benefit for him. He is eating more regular meals, he is leaving the bed at a certain time each  morning. He does not need help with dressing. He is walking with a cane. He is alert oriented and able to answer my questions. He is getting daily physical therapy by home health. He has improved his right hemi-spasticity, he is using her single-prong cane. He could raise today from a seated position without assistance of another person.   Review of Systems: Out of a complete 14 system review, the patient complains of only the following symptoms, and all other reviewed systems are negative. He has never been a reader, watches TV , sleeps a lot.  Aphasia, dysarthria, spastic hemiparesis. Right dominant side.    Social History   Socioeconomic History  . Marital status: Widowed  Spouse name: [name]  . Number of children: 2  . Years of education: 31  . Highest education level: Not on file  Occupational History  . Not on file  Social Needs  . Financial resource strain: Not on file  . Food insecurity:    Worry: Not on file    Inability: Not on file  . Transportation needs:    Medical: Not on file    Non-medical: Not on file  Tobacco Use  . Smoking status: Never Smoker  . Smokeless tobacco: Never Used  Substance and Sexual Activity  . Alcohol use: No  . Drug use: No  . Sexual activity: Not on file  Lifestyle  . Physical activity:    Days per week: Not on file    Minutes per session: Not on file  . Stress: Not on file  Relationships  . Social connections:    Talks on phone: Not on file    Gets together: Not on file    Attends religious service: Not on file    Active member of club or organization: Not on file    Attends meetings of clubs or organizations: Not on file    Relationship status: Not on file  . Intimate partner violence:    Fear of current or ex partner: Not on file    Emotionally abused: Not on file    Physically abused: Not on file    Forced sexual activity: Not on file  Other Topics Concern  . Not on file  Social History Narrative   Patient is married  [name]) and lives at home with his wife.   Patient is disabled.    Patient has two adult children and two grandchildren.   Patient has a high school education.   Patient is left-handed.   Patient drinks one cup of coffee daily.             Family History  Problem Relation Age of Onset  . Colon cancer Brother     Past Medical History:  Diagnosis Date  . CKD (chronic kidney disease), stage II   . CVA (cerebral infarction)    Left thalamic, right-sided weakness wih aphagia, right leg brace  . Dyslipidemia   . Elevated PSA    biopsy negative in 2008  . GERD (gastroesophageal reflux disease)   . Hypertension    without medication since 2006  . Major depression   . Seasonal rhinitis     Past Surgical History:  Procedure Laterality Date  . NASAL SEPTUM SURGERY    . VASECTOMY      Current Outpatient Medications  Medication Sig Dispense Refill  . amLODipine (NORVASC) 2.5 MG tablet Take 2.5 mg by mouth daily.    Marland Kitchen aspirin 81 MG tablet Take 81 mg by mouth every other day.     Marland Kitchen atorvastatin (LIPITOR) 10 MG tablet     . B Complex Vitamins (VITAMIN B COMPLEX PO) Take by mouth.    Marland Kitchen buPROPion (WELLBUTRIN XL) 150 MG 24 hr tablet TAKE 1 TABLET(150 MG) BY MOUTH DAILY 90 tablet 2  . Cholecalciferol (VITAMIN D3) 2000 units TABS Take 2,000 Units by mouth.    Tilman Neat EXTRACT PO Take 450 mg by mouth daily.    . DULoxetine (CYMBALTA) 60 MG capsule Take 60 mg by mouth daily.    . memantine (NAMENDA) 5 MG tablet     . OVER THE COUNTER MEDICATION 1 tablet daily. Osteo Bi-Flex triple strength     No current  facility-administered medications for this visit.     Allergies as of 04/03/2018  . (No Known Allergies)    Vitals: BP 140/82   Pulse 87   Ht 5\' 10"  (1.778 m)   Wt 162 lb (73.5 kg)   BMI 23.24 kg/m  Last Weight:  Wt Readings from Last 1 Encounters:  04/03/18 162 lb (73.5 kg)   Last Height:   Ht Readings from Last 1 Encounters:  04/03/18 5\' 10"  (1.778 m)   Montreal  Cognitive Assessment  09/13/2017 05/31/2014  Visuospatial/ Executive (0/5) 2 3  Naming (0/3) 3 3  Attention: Read list of digits (0/2) 2 2  Attention: Read list of letters (0/1) 1 1  Attention: Serial 7 subtraction starting at 100 (0/3) 2 3  Language: Repeat phrase (0/2) 1 2  Language : Fluency (0/1) 0 1  Abstraction (0/2) 2 2  Delayed Recall (0/5) 1 0  Orientation (0/6) 5 4  Total 19 21   MMSE - Mini Mental State Exam 04/03/2018 03/07/2017 12/02/2016  Orientation to time 3 3 3   Orientation to Place 5 5 5   Registration 3 3 3   Attention/ Calculation 3 3 4   Recall 0 3 3  Language- name 2 objects 2 2 2   Language- repeat 1 1 1   Language- follow 3 step command 3 3 3   Language- read & follow direction 1 1 1   Write a sentence 1 1 1   Copy design 1 1 0  Total score 23 26 26     Physical exam:  General: The patient is awake, alert and appears not in acute distress. The patient is well groomed. Head: Normocephalic, atraumatic. Neck is supple. Mallampati 2 neck circumference: 14.5  Cardiovascular:  Regular rate and rhythm , without  murmurs or carotid bruit, and without distended neck veins. Respiratory: Lungs are clear to auscultation. Skin:  Without evidence of edema, or rash.  Trunk: Neurologic exam : The patient is awake and alert, oriented to place and time.  MOCA 19/30, MMSE 26/30 points. Mr. Wickham performed a more Mini-Mental Status Examination today he stopped the first 2 of the serial sevens. Accessed  again excluding the handwriting since Mr. Kubicek  is impaired through his brainstem stroke, as is drawing , but he was able to place the hands into the clock face correctly.  There is a normal attention span & concentration ability.  Speech dysarthria, dysphonia- Mood and affect are appropriate.  Cranial nerves: Pupils are unequal but briskly reactive to light. He has lost the right peripheral vision, stereo vision is impaired -  He cannot drive . Has no diplopia. The patient is able to  direct his gaze left and right but not up or down. 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 Hearing to finger rub intact. Facial sensation intact to fine touch.  Facial motor strength is symmetric and tongue and uvula move midline. Tongue protrusion into either cheek is normal! Shoulder shrug is normal.  Gait and station: Patient walks today with walker while he wears his AFT which he has had since a stroke 14 years ago.  Deep tendon reflexes: clonic-  brisk in the right .Babinski maneuver response is upgoing right .   Assessment:  He has done very well, he is social, is involved, laughs and loves to eat. He swims, he talks. 30 minute vist with more than 50% of the face to face time dedicated to discussion of physical abilities, stable cognition, Improved over  last visit- He has the knowledge for fall prevention, his fall 2 days ago was related to not sitting down on the toilet. .   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   Hypersomnia - resolved -   he is physicially active. His gait is much improved the stability is improved by using a walker, he responded well to the nerve block. He has undergone physical therapy and occupational therapy and is still continued to go. His ankles and his musculature of the feet have improved. His posture has improved. He continues exercises, has a new brace .    Cognitive impairment not progressed by history and observation. 26-30 again - will not repeat in 6 months but perhaps every 12 month. .  Short term memory loss. memory impairment, constitutes dementia. He cannot be measured on MOCA due to vision and handwriting impairment. Mini-Mental Status Examination revealed today 26 out of 30 points . MOCA 19 out of 26.  Adjusted for disability.  Daughter feels he is more forgetful.     Depression is improved. Less agitated and less angry on Wellbutrin. More energy. The geriatric depression score was endorsed at 2 points again  , down from 7 points which was very indicative of depression.    [place] of his pontine/ brain stem stroke from 2005 - including right hemiparesis, right foot drop.   Plan:  I will order a sleep study- he sleeps in a bedroom on the ground floor of his daughters home, they have witnessed gasping, snoring and he has hypersomnia. Epworth is 13/ 24. FSS at 36/ 63. Attended sleep study in this patient with remote  brain stem stroke, right hemiparesis.    Memory back to baseline- but subjectively feeling stimulated and better- , perhaps due to a more stimulating environment. C He takes an antidepressive and I like for him to have something that is stimulating as well; as an antidepressiant. Wellbutrin was chosen.  He seems to blossom.  Has a walker allowing for a more symmetric gait and he responded well to a nerve block, Dr Doroteo BradfordKirstein ( October ).   He lives with his daughter, Harold HedgeDena Eckhardt :She got Power of attorney for medical and financial affairs.   Rv in 6 month with MMSE- if he has a declining trend will add Aricept to Namenda. Watch for bradycardia.    Porfirio Mylararmen Delvis Kau MD   Cc Dr Donette LarryHusain, Cena BentonKarar  04/03/2018

## 2018-04-18 ENCOUNTER — Ambulatory Visit (INDEPENDENT_AMBULATORY_CARE_PROVIDER_SITE_OTHER): Payer: Medicare Other | Admitting: Neurology

## 2018-04-18 DIAGNOSIS — G4730 Sleep apnea, unspecified: Secondary | ICD-10-CM

## 2018-04-18 DIAGNOSIS — I6350 Cerebral infarction due to unspecified occlusion or stenosis of unspecified cerebral artery: Secondary | ICD-10-CM

## 2018-04-18 DIAGNOSIS — F5109 Other insomnia not due to a substance or known physiological condition: Secondary | ICD-10-CM

## 2018-04-18 DIAGNOSIS — G3184 Mild cognitive impairment, so stated: Secondary | ICD-10-CM

## 2018-04-18 DIAGNOSIS — G4719 Other hypersomnia: Secondary | ICD-10-CM

## 2018-04-18 DIAGNOSIS — F519 Sleep disorder not due to a substance or known physiological condition, unspecified: Secondary | ICD-10-CM

## 2018-04-18 DIAGNOSIS — G4710 Hypersomnia, unspecified: Secondary | ICD-10-CM | POA: Diagnosis not present

## 2018-04-20 ENCOUNTER — Telehealth: Payer: Self-pay | Admitting: Neurology

## 2018-04-20 NOTE — Telephone Encounter (Signed)
I called pt's daughter and I advised pt that Dr. Vickey Hugerohmeier reviewed their sleep study results and found that has mild sleep apnea and recommends that pt be treated with a cpap. Dr. Vickey Hugerohmeier recommends that pt return for a repeat sleep study in order to properly titrate the cpap and ensure a good mask fit. Pt is agreeable to returning for a titration study. I advised pt that our sleep lab will file with pt's insurance and call pt to schedule the sleep study when we hear back from the pt's insurance regarding coverage of this sleep study. Pt verbalized understanding of results. Pt had no questions at this time but was encouraged to call back if questions arise.

## 2018-04-20 NOTE — Procedures (Signed)
PATIENT'S NAME:  [name], [name] DOB:      1941-06-04      MR#:    [id number]     DATE OF RECORDING: 04/18/2018 REFERRING M.D.:  Georgann HousekeeperKarrar Husain, M.D. Study Performed:   Baseline Polysomnogram HISTORY:  [name] is a 77 year old widowed patient with a history of thalamic and pontine stroke, hemiparesis and dysphonia, abnormal eye movements.  I adjusted his MMSE and excluded the writing and drawing, he scored 23-27 points. He lives with his daughter and her family. He wants to discuss a sleep study - he snores loudly and family hears him gasping. He is sleepier in daytime  and  having trouble to wake up in AM- he may stay in bed until 1 PM.   The patient endorsed the Epworth Sleepiness Scale at 13/24 points, FSS at .   The patient's weight 161 pounds with a height of 70 (inches), resulting in a BMI of 23.1 kg/m2. The patient's neck circumference measured 14.5 inches.  CURRENT MEDICATIONS: Norvasc, Aspirin, Lipitor, Wellbutrin, Cymbalta, Namenda   PROCEDURE:  This is a multichannel digital polysomnogram utilizing the Somnostar 11.2 system.  Electrodes and sensors were applied and monitored per AASM Specifications.   EEG, EOG, Chin and Limb EMG, were sampled at 200 Hz.  ECG, Snore and Nasal Pressure, Thermal Airflow, Respiratory Effort, CPAP Flow and Pressure, Oximetry was sampled at 50 Hz. Digital video and audio were recorded.      BASELINE STUDY: Lights Out was at 22:26 and Lights On at 05:00.  Total recording time (TRT) was 394.5 minutes, with a total sleep time (TST) of 213.5 minutes.   The patient's sleep latency was 45.5 minutes.  REM latency was 71.5 minutes.  The sleep efficiency was 54.1 %.     SLEEP ARCHITECTURE: WASO (Wake after sleep onset) was 135 minutes.  There were 22.5 minutes in Stage N1, 89 minutes Stage N2, 56.5 minutes Stage N3 and 45.5 minutes in Stage REM.  The percentage of Stage N1 was 10.5%, Stage N2 was 41.7%, Stage N3 was 26.5% and Stage R (REM sleep) was 21.3%.    RESPIRATORY ANALYSIS:  There were a total of 31 respiratory events:  12 obstructive apneas, 0 central apneas and 3 mixed apneas with a total of 15 apneas and an apnea index (AI) of 4.2 /hour. There were 16 hypopneas with a hypopnea index of 4.5 /hour. The patient also had 0 respiratory event related arousals (RERAs).The total APNEA/HYPOPNEA INDEX (AHI) was 8.7/hour and the total RESPIRATORY DISTURBANCE INDEX was 8.7 /hour.   0 events occurred in REM sleep and 35 events in NREM. The REM AHI was 0.0 /hour, versus a non-REM AHI of 11.1. The patient spent 61.5 minutes of total sleep time in the supine position and 152 minutes in non-supine. The supine AHI was 30.2 versus a non-supine AHI of 0.0.  OXYGEN SATURATION & C02:  The Wake baseline 02 saturation was 94%, with the lowest being 70%. Time spent below 89% saturation equaled 9 minutes.  The patient had a total of 5 Periodic Limb Movements.  The Periodic Limb Movement (PLM) index was 1.4 and the PLM Arousal index was 0.3/hour. The arousals were noted as: 23 were spontaneous, 1 was associated with PLMs, and 6 were associated with respiratory events.  Audio and video analysis did not show any abnormal or unusual movements, behaviors, phonations or vocalizations.  Prolonged periods of wakefulness arise directly out of REM sleep - without any physiological change in heart rate, oxygen saturation,  limb movements, etc. caused by nocturia twice during the recording.  Loud snoring in supine.   EKG was in keeping with normal sinus rhythm (NSR).Post-study, the patient indicated that sleep was the same as usual, only shorter.   IMPRESSION:  1. Mild Obstructive Sleep Apnea (OSA) strictly in NREM sleep and accentuated in Supine sleep.  2. Primary Snoring 3. Normal EKG 4. Arousals out of REM sleep, spontaneously.    RECOMMENDATIONS:  1. Advise full-night, attended, CPAP titration study to optimize therapy.     I certify that I have reviewed the entire  raw data recording prior to the issuance of this report in accordance with the Standards of Accreditation of the American Academy of Sleep Medicine (AASM)     Melvyn Novasarmen Amiri Tritch, MD   04-20-2018 Diplomat, American Board of Psychiatry and Neurology  Diplomat, American Board of Sleep Medicine Medical Director, [facility]

## 2018-04-20 NOTE — Telephone Encounter (Signed)
-----   Message from Melvyn Novasarmen Dohmeier, MD sent at 04/20/2018  8:50 AM EDT ----- IMPRESSION:  1. Mild Obstructive Sleep Apnea (OSA) strictly in NREM sleep and  accentuated in Supine sleep.  2. Primary Snoring 3. Normal EKG 4. Arousals out of REM sleep, spontaneously.    RECOMMENDATIONS:  1. Advise full-night, attended, CPAP titration study to optimize  therapy.

## 2018-04-20 NOTE — Addendum Note (Signed)
Addended by: Melvyn NovasHMEIER, Jacora Hopkins on: 04/20/2018 08:50 AM   Modules accepted: Orders

## 2018-04-30 ENCOUNTER — Ambulatory Visit (INDEPENDENT_AMBULATORY_CARE_PROVIDER_SITE_OTHER): Payer: Medicare Other | Admitting: Neurology

## 2018-04-30 DIAGNOSIS — F5109 Other insomnia not due to a substance or known physiological condition: Secondary | ICD-10-CM

## 2018-04-30 DIAGNOSIS — G4733 Obstructive sleep apnea (adult) (pediatric): Secondary | ICD-10-CM | POA: Diagnosis not present

## 2018-04-30 DIAGNOSIS — G4710 Hypersomnia, unspecified: Secondary | ICD-10-CM

## 2018-04-30 DIAGNOSIS — G4730 Sleep apnea, unspecified: Principal | ICD-10-CM

## 2018-04-30 DIAGNOSIS — G4719 Other hypersomnia: Secondary | ICD-10-CM

## 2018-04-30 DIAGNOSIS — F519 Sleep disorder not due to a substance or known physiological condition, unspecified: Secondary | ICD-10-CM

## 2018-04-30 DIAGNOSIS — G3184 Mild cognitive impairment, so stated: Secondary | ICD-10-CM

## 2018-04-30 DIAGNOSIS — I6350 Cerebral infarction due to unspecified occlusion or stenosis of unspecified cerebral artery: Secondary | ICD-10-CM

## 2018-05-02 ENCOUNTER — Telehealth: Payer: Self-pay | Admitting: Neurology

## 2018-05-02 NOTE — Procedures (Signed)
PATIENT'S NAME:  [name], [name] DOB:      [birth year]      MR#:    [id number]     DATE OF RECORDING: 04/30/2018 REFERRING M.D.:  [name], M.D. Study Performed:   Titration to Positive Airway Pressure HISTORY:   [name] returns for a CPAP titration following his PSG from 04-18-2018. The Patient had an overall AHI of 8.7, Supine AHI of 30.2 /h, REM sleep was not seen. His oxygen saturation was not reduced (only 9 minutes, Nadir 70%), but loud snoring was noted. The patient is hemiparetic and has impaired EOM, status post pontine stroke.  The patient endorsed the Epworth Sleepiness Scale at 13/24 points.   The patient's weight 162 pounds with a height of 70 (inches), resulting in a BMI of 23.0 kg/m2. The patient's neck circumference measured 14.5 inches.  CURRENT MEDICATIONS: Norvasc, Aspirin, Lipitor, Wellbutrin, Cymbalta, Namenda   PROCEDURE:  This is a multichannel digital polysomnogram utilizing the SomnoStar 11.2 system.  Electrodes and sensors were applied and monitored per AASM Specifications.   EEG, EOG, Chin and Limb EMG, were sampled at 200 Hz.  ECG, Snore and Nasal Pressure, Thermal Airflow, Respiratory Effort, CPAP Flow and Pressure, Oximetry was sampled at 50 Hz. Digital video and audio were recorded.      CPAP was initiated at 5 cmH20 with heated humidity per AASM split night standards and pressure was advanced to 8 cmH20 because of hypopneas, apneas and desaturations.  At a PAP pressure of 8 cmH20, there was a reduction of the AHI to 0.0 but improvement of sleep apnea was associated with poor tolerance. The patient did best at 7 cm water pressure.   Lights Out was at 21:57 and Lights On at 04:56. Total recording time (TRT) was 420 minutes, with a total sleep time (TST) of 223 minutes. The patient's sleep latency was 158 minutes. REM latency was 124.5 minutes.  The sleep efficiency was 53.1 %.    SLEEP ARCHITECTURE: WASO (Wake after sleep onset) was 119 minutes.  There were 12  minutes in Stage N1, 182 minutes Stage N2, 0 minutes Stage N3 and 29 minutes in Stage REM.  The percentage of Stage N1 was 5.4%, Stage N2 was 81.6%, Stage N3 was 0% and Stage R (REM sleep) was 13.%. The sleep architecture was notable for many, prolonged periods of wakefulness. The patient would wake up straight out of REM sleep.    RESPIRATORY ANALYSIS:  There was a total of 6 respiratory events: 5 obstructive apneas, 1 central apnea and 0 mixed apneas with 0 hypopneas. The patient also had 0 respiratory event related arousals (RERAs).     The total APNEA/HYPOPNEA INDEX (AHI) was 1.6 /hour and the total RESPIRATORY DISTURBANCE INDEX was 1.6 /hour  0 events occurred in REM sleep and 6 events in NREM. The REM AHI was 0 /hour versus a non-REM AHI of 1.9 /hour.  The patient spent 90 minutes of total sleep time in the supine position and 133 minutes in non-supine. The supine AHI was 2.7, versus a non-supine AHI of 0.9.  OXYGEN SATURATION & C02:  The baseline 02 saturation was 94%, with the lowest being 91%. Time spent below 89% saturation equaled 0 minutes.  PERIODIC LIMB MOVEMENTS:  The patient had a total of 17 Periodic Limb Movements. The Periodic Limb Movement (PLM) index was 4.6 and the PLM Arousal index was 0 /hour.   Audio and video analysis did not show any abnormal or unusual movements, behaviors, phonations or vocalizations.  The patient took one bathroom break. Snoring was no longer noted. EKG was in keeping with normal sinus rhythm. Post-study, the patient indicated that sleep was the same as usual, only shorter.  The patient was fitted with a ResMed AirFit P 10 medium size - Nasal Pillow Mask.  DIAGNOSIS 1. Obstructive Sleep Apnea and snoring were alleviated at 7 and 8 cm water pressure.   2. No Sleep Related Hypoxemia and no clinically significant Periodic Limb Movement Disorder were noted.   PLANS/RECOMMENDATIONS: [name] will receive a CPAP with auto-titration function. Our goal  is it to capture the different pressure needs in supine and non-supine sleep. A pressure window from 4-10 cm water with 2 cm EPR is prescribed. The patient was fitted with a ResMed AirFit P 10 medium size - Nasal Pillow Mask CPAP therapy compliance is defined as 4 hours or more of nightly use.  [facility].   Please call [phone] with any questions.      I certify that I have reviewed the entire raw data recording prior to the issuance of this report in accordance with the Standards of Accreditation of the American Academy of Sleep Medicine (AASM)   Melvyn Novasarmen Fitzpatrick Alberico, M.D.   05-02-2018  Diplomat, American Board of Psychiatry and Neurology  Diplomat, American Board of Sleep Medicine Medical Director, [facility]

## 2018-05-02 NOTE — Addendum Note (Signed)
Addended by: Melvyn NovasHMEIER, Damieon Armendariz on: 05/02/2018 12:50 PM   Modules accepted: Orders

## 2018-05-02 NOTE — Telephone Encounter (Signed)
-----   Message from Melvyn Novasarmen Dohmeier, MD sent at 05/02/2018 12:50 PM EDT ----- DIAGNOSIS 1. Obstructive Sleep Apnea and snoring were alleviated at 7 and 8  cm water pressure.  2. No Sleep Related Hypoxemia and no clinically significant  Periodic Limb Movement Disorder were noted.   PLANS/RECOMMENDATIONS: Mr. Angie FavaDonnie Kuhlmann will receive a CPAP with  auto-titration function. Our goal is it to capture the different  pressure needs in supine and non-supine sleep. A pressure window  from 4-10 cm water with 2 cm EPR is prescribed. The patient was  fitted with a ResMed AirFit P 10 medium size - Nasal Pillow Mask CPAP therapy compliance is defined as 4 hours or more of nightly  use.

## 2018-05-02 NOTE — Telephone Encounter (Signed)
I called pt and spoke with his daughter. I advised pt that Dr. Vickey Hugerohmeier reviewed their sleep study results and found that pt was treated well with the CPAP at pressure of 7-8 cm water pressure. Dr. Vickey Hugerohmeier recommends that pt starts a auto CPAP 4-10 cm water pressure. I reviewed PAP compliance expectations with the pt. Pt is agreeable to starting a CPAP. I advised pt that an order will be sent to a DME, [facility], and [facility] will call the pt within about one week after they file with the pt's insurance. [facility] will show the pt how to use the machine, fit for masks, and troubleshoot the CPAP if needed. A follow up appt was made for insurance purposes with Dr. Vickey Hugerohmeier on Dec 4,2019 at 8:30 am. Pt verbalized understanding to arrive 15 minutes early and bring their CPAP. A letter with all of this information in it will be mailed to the pt as a reminder. I verified with the pt that the address we have on file is correct. Pt verbalized understanding of results. Pt had no questions at this time but was encouraged to call back if questions arise.

## 2018-07-03 ENCOUNTER — Encounter: Payer: Self-pay | Admitting: Neurology

## 2018-07-05 ENCOUNTER — Encounter: Payer: Self-pay | Admitting: Neurology

## 2018-07-05 ENCOUNTER — Ambulatory Visit: Payer: Medicare Other | Admitting: Neurology

## 2018-07-05 VITALS — BP 120/77 | HR 93 | Ht 70.0 in | Wt 163.5 lb

## 2018-07-05 DIAGNOSIS — Z9989 Dependence on other enabling machines and devices: Secondary | ICD-10-CM

## 2018-07-05 DIAGNOSIS — R413 Other amnesia: Secondary | ICD-10-CM | POA: Diagnosis not present

## 2018-07-05 DIAGNOSIS — G4733 Obstructive sleep apnea (adult) (pediatric): Secondary | ICD-10-CM | POA: Diagnosis not present

## 2018-07-05 NOTE — Patient Instructions (Signed)
Hypersomnia Hypersomnia is when you feel extremely tired during the day even though you're getting plenty of sleep at night. You may need to take naps during the day, and you may also be extremely difficult to wake up when you are sleeping. What are the causes? The cause of your hypersomnia may not be known. Hypersomnia may be caused by:  Medicines.  Sleep disorders, such as narcolepsy.  Trauma or injury to your head or nervous system.  Using drugs or alcohol.  Tumors.  Medical conditions, such as depression or hypothyroidism.  Genetics.  What are the signs or symptoms? The main symptoms of hypersomnia include:  Feeling extremely tired throughout the day.  Being very difficult to wake up.  Sleeping for longer and longer periods.  Taking naps throughout the day.  Other symptoms may include:  Feeling: ? Restless. ? Annoyed. ? Anxious. ? Low energy.  Having difficulty: ? Remembering. ? Speaking. ? Thinking.  Losing your appetite.  Experiencing hallucinations.  How is this diagnosed? Hypersomnia may be diagnosed by:  Medical history and physical exam. This will include a sleep history.  Completing sleep logs.  Tests may also be done, such as: ? Polysomnography. ? Multiple sleep latency test (MSLT).  How is this treated? There is no cure for hypersomnia, but treatment can be very effective in helping manage the condition. Treatment may include:  Lifestyle and sleeping strategies to help cope with the condition.  Stimulant medicines.  Treating any underlying causes of hypersomnia.  Follow these instructions at home:  Take medicines only as directed by your health care provider.  Schedule short naps for when you feel sleepiest during the day. Tell your employer or teachers that you have hypersomnia. You may be able to adjust your schedule to include time for naps.  Avoid drinking alcohol or caffeinated beverages.  Do not eat a heavy meal before  bedtime. Eat at about the same times every day.  Do not drive or operate heavy machinery if you are sleepy.  Do not swim or go out on the water without a life jacket.  If possible, adjust your schedule so that you do not have to work or be active at night.  Keep all follow-up visits as directed by your health care provider. This is important. Contact a health care provider if:  You have new symptoms.  Your symptoms get worse. Get help right away if: You have serious thoughts of hurting yourself or someone else. This information is not intended to replace advice given to you by your health care provider. Make sure you discuss any questions you have with your health care provider. Document Released: 08/13/2002 Document Revised: 01/29/2016 Document Reviewed: 03/28/2014 Elsevier Interactive Patient Education  2018 Elsevier Inc.  

## 2018-07-05 NOTE — Progress Notes (Signed)
Provider:  Melvyn Novas, MD    Referring Provider: [name], MD Primary Care Physician:  Dennis Housekeeper, MD  Chief Complaint  Patient presents with  . Follow-up    Rm 10 , daughter  . cpap    DME [facility].  . Memory Loss    MMSE 24/30     07-05-2018, RV with [name] and daughter.  Follow up on PSG, CPAP study and auto compliance.    He underwent 2 sleep studies- one baseline on 04-18-2018, baseline polysomnography revealed mild sleep apnea at 8.7 events per hour.  REM sleep accentuation was not noted, he actually did not have apnea when not supine. He always sleeps supine- that brings his apnea hypopnea index  to 30/h. He had a follow-up titration to positive airway pressure on 30 April 2018, and his apnea was significantly reduced even in supine sleep his AHI was 2.7, and he had 0.0 apneas at a final pressure of 8 cmH2O.  He is using an auto titration CPAP with a pressure window between 4 and 10 cmH2O was 2 cm EPR he was tried here with a nasal pillow mask but if this is uncomfortable he should be allowed to wear what ever gets in the best air seal.  His download over the last 30 days shows 23 out of 30 days of use of CPAP but only 17 days that he managed to use it for 4 hours or more.  Average use of time is just 4 hours 30 minutes now the pressure is as quoted above, the residual AHI is only 1.9 the 95th percentile pressure is 7.4 cmH2O he does not have Cheyne-Stokes respiration.  I would like for him to continue if he can also he has not yet found that this reduces his daytime sleepiness significantly.  It may be working better if he can use it 4 to 5 hours at night. He can manage to put his mask on by himself.   Good news , his Granddaughter [name] will get married next April, and the family is very happy for her.  [name] has had some changes in LOC- awareness of himself. He recently sat in the living room and did not wear a shirt , his daughter asked him  why and he was unaware of the lack of clothing. This was a single event, not repeated. His voice is hoarse and dysarthric- as usual- but he speaks fluently.  He remains excessively daytime sleepy at 18/ 24 Epworth Sleepiness Score.    Interval history from 03 April 2018, patent and daughter here today. I am again very happy to see that [name] is walking without a cane, he is using a walker, he has been gaining mobility and range of motion, he appears much more alert and younger than he looked years ago.  Dr. Doroteo Bradford had followed him for possible Botox needs, he continues to live with his daughter [name] and her husband.  He has even done well with the heat.  Will be performed today another Mini-Mental Status Examination and he scored 23 out of 30 points, did 26 out of 30 the last time. I adjusted his MMSE and excluded the writing and drawing. 23-27 points.  He wants to discuss the sleep study - he snores loudly and family hears him gasping. He is sleepier in daytime / and she reports having trouble to get him woken up in AM- he may stay in bed until 1 PM.  Interval history from 13 September 2017, [name] and his daughter [name] I seen here today.  The patient is using his walker but had experienced a fall 2 days ago after not having had any falls in 3 or 4 months.  The fall happened in the bathroom while urinating, standing and not holding on to a grab bar. He has visited a senior center and enjoyed the visits. He is living with [name] and her husband. He goes swimming ! He plays cards! He is active and looks well.  MOCA today after MMSE last visit was at a  very high score 26/30.   Interval history from 03/07/2017, [name] and his daughter [name] are seen here today. The patient is no longer using a 3 pronged cane but uses a walker which allows him to walk straighter with better body position and symmetry and keeps him from falling. His mobility also has improved with  AFO- and he responded well  to a nerve block Dr Doroteo Bradford will also evaluate the shoulder for possible BoTox needs. He is not nearly as tense since using the walker and replacing the cane.  PT and OT continue .  In comparison to previous visits his blood pressure is improved, his weight has improved, his ability to walk and exercise tolerance has greatly improved. He is alert and no longer as sleepy. I will follow Dr Wynn Banker observation.     HPI: Interval history from 12/03/2016. [name] died in early 09-Feb-2018and [name] has now moved in with his daughter [name]. She reports that he is doing well with the changes in environment and he does look well groomed and well fed and alert. We performed today Mini-Mental Status Examination in which she scored 26 out of 30 points this has to be seen in context with his right hand dominant hemiparesis, he is still able to draw also with a tremor, he was able to draw clock face and right a sentence. He generated 7 words. I would therefore adjust his test to 27 out of 30 points. [name] has noticed that her father snores horribly, and he reports feeling sleepy all the time. 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 I will order a sleep test for him. His right foot needs a new AFT, and he developed some calluses. I will send him to a PT evaluation and take their recommendations to [facility].    [name] is a 77 y.o. male  Is seen here after a prolonged time, I used to follow this stroke patient during his hospitalization 10 years ago. His visit today was arranged by his wife, his PCP is [name], [name] was referred for a memory evaluation .Because of his history of a complicated CVA ,brain stem stroke syndromes and hemiparesis , a vascular effect has to be taken into account. [name] father had late onset dementia and personality changes and [name] sees some parallels in his behavior. He  has become more aggressive, easier agitated, which was not part of his personality before this year. The couple celebreated their Maxie Barb Anniversary in 2014,  active in the [employer] in [place] . [name] performs household chores and the couple spends time with the grandchildren now 32 and 71.  He has become impulsive, and sometimes" downright mean ". He still uses a urinary cath. Has right hand weakness ,  leg stiffness, left sensory loss, right facial weakness -hold cane in the left hand. Dysphonia, aphasia.  [name] is falling more often, his skin is scabbed. He fell rising while pulling up on his cane. His walker has a seat and is used outside.  His other cane will have 3 prongs. He sleeps a lot. His wife started peritoneal dialysis.  2 times a day. Needs to wear a diaper for bowel accident. Is very happy with Dr. Abel Presto.  She has a sleep apnea evaluation tonight. Estelle snores terribly, even before his brainstem stroke, dependent on supine position.   I have  the pleasure today on 06/02/2016 to see [name] again, today in the presence of his daughter [name]. [name] had become extremely daytime sleepy over the last couple of months while his wife's health has declined to the level where she could not safely ambulate for longer distance, and she is probably no longer safe to drive. She failed peritoneal dialysis, became extremely dehydrated and was insufficiently dialyzed. She has not a switch to hemodialysis but still is not thriving. For this reason the novel take over the organization of home health care which she initiated already. And also the healthcare power of attorney.She has requested FMLA. In the meantime home health care has been of significant benefit for him. He is eating more regular meals, he is leaving the bed at a certain time each morning. He does not need help with dressing. He is walking with a cane. He is alert oriented and able to answer my questions. He is  getting daily physical therapy by home health. He has improved his right hemi-spasticity, he is using her single-prong cane. He could raise today from a seated position without assistance of another person.   Review of Systems: Out of a complete 14 system review, the patient complains of only the following symptoms, and all other reviewed systems are negative. He has never been a reader, watches TV , sleeps a lot. June 2019-Epworth is 13/ 24. FSS at 36/ 63.  Aphasia, dysarthria, spastic hemiparesis. Right dominant side.  [name] has had some changes in LOC- awareness of himself. He recently sat in the living room and did not wear a shirt , his daughter asked him why and he was unaware of the lack of clothing. This was a single event, not repeated. His voice is hoarse and dysarthric- as usual- but he speaks fluently.  He remains excessively daytime sleepy at 18/ 24 Epworth Sleepiness Score on 07-05-2018 .     Social History   Socioeconomic History  . Marital status: Widowed    Spouse name: [name]  . Number of children: 2  . Years of education: 37  . Highest education level: Not on file  Occupational History  . Not on file  Social Needs  . Financial resource strain: Not on file  . Food insecurity:    Worry: Not on file    Inability: Not on file  . Transportation needs:    Medical: Not on file    Non-medical: Not on file  Tobacco Use  . Smoking status: Never Smoker  . Smokeless tobacco: Never Used  Substance and Sexual Activity  . Alcohol use: No  . Drug use: No  . Sexual activity: Not on file  Lifestyle  . Physical activity:    Days per week: Not on file    Minutes per session: Not on file  . Stress: Not on file  Relationships  . Social  connections:    Talks on phone: Not on file    Gets together: Not on file    Attends religious service: Not on file    Active member of club or organization: Not on file    Attends meetings of clubs or organizations: Not on file     Relationship status: Not on file  . Intimate partner violence:    Fear of current or ex partner: Not on file    Emotionally abused: Not on file    Physically abused: Not on file    Forced sexual activity: Not on file  Other Topics Concern  . Not on file  Social History Narrative   Patient is married [name]) and lives at home with his wife.   Patient is disabled.    Patient has two adult children and two grandchildren.   Patient has a high school education.   Patient is left-handed.   Patient drinks one cup of coffee daily.             Family History  Problem Relation Age of Onset  . Colon cancer Brother     Past Medical History:  Diagnosis Date  . CKD (chronic kidney disease), stage II   . CVA (cerebral infarction)    Left thalamic, right-sided weakness wih aphagia, right leg brace  . Dyslipidemia   . Elevated PSA    biopsy negative in 2008  . GERD (gastroesophageal reflux disease)   . Hypertension    without medication since 2006  . Major depression   . Seasonal rhinitis     Past Surgical History:  Procedure Laterality Date  . NASAL SEPTUM SURGERY    . VASECTOMY      Current Outpatient Medications  Medication Sig Dispense Refill  . amLODipine (NORVASC) 2.5 MG tablet Take 2.5 mg by mouth daily.    Marland Kitchen aspirin 81 MG tablet Take 81 mg by mouth every other day.     Marland Kitchen atorvastatin (LIPITOR) 10 MG tablet     . B Complex Vitamins (VITAMIN B COMPLEX PO) Take by mouth.    Marland Kitchen buPROPion (WELLBUTRIN XL) 150 MG 24 hr tablet TAKE 1 TABLET(150 MG) BY MOUTH DAILY 90 tablet 2  . Cholecalciferol (VITAMIN D3) 2000 units TABS Take 2,000 Units by mouth.    Tilman Neat EXTRACT PO Take 450 mg by mouth daily.    . DULoxetine (CYMBALTA) 60 MG capsule Take 60 mg by mouth daily.    . memantine (NAMENDA) 10 MG tablet Take 1 tablet (10 mg total) by mouth 2 (two) times daily. 60 tablet 5  . OVER THE COUNTER MEDICATION 1 tablet daily. Osteo Bi-Flex triple strength     No current  facility-administered medications for this visit.     Allergies as of 07/05/2018  . (No Known Allergies)    Vitals: BP 120/77   Pulse 93   Ht 5\' 10"  (1.778 m)   Wt 163 lb 8 oz (74.2 kg)   BMI 23.46 kg/m  Last Weight:  Wt Readings from Last 1 Encounters:  07/05/18 163 lb 8 oz (74.2 kg)   Last Height:   Ht Readings from Last 1 Encounters:  07/05/18 5\' 10"  (1.778 m)   Montreal Cognitive Assessment  09/13/2017 05/31/2014  Visuospatial/ Executive (0/5) 2 3  Naming (0/3) 3 3  Attention: Read list of digits (0/2) 2 2  Attention: Read list of letters (0/1) 1 1  Attention: Serial 7 subtraction starting at 100 (0/3) 2 3  Language: Repeat phrase (  0/2) 1 2  Language : Fluency (0/1) 0 1  Abstraction (0/2) 2 2  Delayed Recall (0/5) 1 0  Orientation (0/6) 5 4  Total 19 21   MMSE - Mini Mental State Exam 07/05/2018 04/03/2018 03/07/2017  Orientation to time 1 3 3   Orientation to Place 5 5 5   Registration 3 3 3   Attention/ Calculation 4 3 3   Recall 3 0 3  Language- name 2 objects 2 2 2   Language- repeat 1 1 1   Language- follow 3 step command 3 3 3   Language- read & follow direction 1 1 1   Write a sentence 0 1 1  Copy design 1 1 1   Total score 24 23 26     Physical exam:  General: The patient is awake, alert and appears not in acute distress. The patient is well groomed. Head: Normocephalic, atraumatic. Neck is supple. Mallampati 2 neck circumference: 14.5  Cardiovascular:  Regular rate and rhythm , without  murmurs or carotid bruit, and without distended neck veins. Respiratory: Lungs are clear to auscultation. Skin:  Without evidence of edema, or rash.  Trunk: Neurologic exam : The patient is awake and alert, oriented to place and time.  MOCA 19/26, MMSE 26/26 points. The test has to be adjusted for him not being able to draw and write.  [name] performed a more Mini-Mental Status Examination today he stopped the first 2 of the serial sevens. Accessed  again excluding the  handwriting since [name]  is impaired through his brainstem stroke, as is drawing , but he was able to place the hands into the clock face correctly.  There is a normal attention span & concentration ability.  Speech dysarthria, dysphonia- Mood and affect are appropriate.  Cranial nerves: Pupils are unequal but briskly reactive to light. He has lost the right peripheral vision, stereo vision is impaired -  He cannot drive . Has no diplopia. The patient is able to direct his gaze left and right but not up or down. 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 Hearing to finger rub intact. Facial sensation intact to fine touch.  Facial motor strength is symmetric and tongue and uvula move midline. Tongue protrusion into either cheek is normal! Shoulder shrug is normal.  Gait and station: Patient walks today with walker while he wears his AFT which he has had since a stroke 14 years ago.  Deep tendon reflexes: clonic-  brisk in the right .Babinski maneuver response is upgoing right .   Assessment:  He has done very well, he is social, is involved, laughs and loves to eat. He swims, he talks. 30 minute vist with more than 50% of the face to face time dedicated to discussion of physical abilities, stable cognition, Improved over last visit- He has the knowledge for fall prevention, his fall 2 days ago was related to not sitting down on the toilet. .   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   OSA with Hypersomnia - sleepiness is not resolved on CPAP. But  he is physicially active.   His gait is much improved the stability is improved by using a walker, he responded well to the nerve block. He has undergone physical therapy and occupational therapy and is still continued to go. His  ankles and his musculature of  the feet have improved. His posture has improved. He continues exercises, has a new brace .    Cognitive impairment not progressed by history and observation. 26-30 again - will not repeat in 6 months but perhaps every 12 month. .  Short term memory loss. memory impairment, constitutes dementia. He cannot be measured on MOCA due to vision and handwriting impairment. Mini-Mental Status Examination revealed today 26 out of 30 points . MOCA 19 out of 26.  Adjusted for disability. Daughter feels he is more forgetful.     Depression is improved. Less agitated and less angry on Wellbutrin. More energy. The geriatric depression score was endorsed at 2 points again  , down from 7 points which was very indicative of depression.    [place] of his pontine/ brain stem stroke from 2005 - including right hemiparesis, right foot drop.  Using AFT.   Plan:  Start using CPAP nightly and aim for 5 hours or more, use CPAP when you nap.   He lives with his daughter, Maximus Hoffert :She got Power of attorney for medical and financial affairs.   Rv in 6 month with MMSE- if he has a declining trend will add Aricept to Namenda. Watch for bradycardia.    Porfirio Mylar Greg Eckrich MD   Cc Dr Donette Larry, [place]  07/05/2018

## 2018-08-09 ENCOUNTER — Ambulatory Visit: Payer: Self-pay | Admitting: Neurology

## 2018-09-27 ENCOUNTER — Other Ambulatory Visit: Payer: Self-pay | Admitting: Neurology

## 2018-12-01 NOTE — Therapy (Signed)
[facility] [address] [place], [place], [address] Phone: [phone]   Fax:  [phone]  Patient Details  Name: [name] MRN: [id number] Date of Birth: [birth year] Referring Provider:  Benita Stabile, MD  Encounter Date: 12/01/2018  SPEECH THERAPY DISCHARGE SUMMARY  Visits from Start of Care: 5  Current functional level related to goals / functional outcomes: Pt was not seen in this clinic after May 2019. His last therapy note, goals, and "assessment" section follow:  ================================ Other treatment/comments Pt req'd min A with chin-tuck procedure with POs when SLP talking with pt (cough occurred x1 due to larger sip size/no chin tuck).  SLP reminded pt/daughter re NO DISTRACTIONS (TV, conversation, etc) during meals would allow pt to focus on and complete chin-tuck. When no distractions, pt did not exhibit overt s/s aspiration. Pt completion with HEP accuracy was appropriate. SLP postulates pt has been more consistnet with HEP since last session.       Dysphagia Recommendations   Diet recommendations  Dysphagia 3 (mechanical soft);Thin liquid   SLP Short Term Goals - 01/23/18 0936              SLP SHORT TERM GOAL #1    Title  Pt will follow swallow precautions with occasional min A over 3 sessions.     Baseline  01-20-18     Time  3     Period  Weeks     Status  On-going          SLP SHORT TERM GOAL #2    Title  Pt will complete 25 repetitions of RMT with self reported effort of 7 or less on a scale of 10 with 90% accuracy over 3 sessions     Baseline  01-20-18     Time  3     Period  Weeks     Status  On-going              SLP Long Term Goals - 01/23/18 6203              SLP LONG TERM GOAL #1    Title  Pt will follow swallow precautions with rare min A in non distracting environment with rare min A over 2 sessions     Baseline  01-20-18     Time  7     Period  Weeks     Status   On-going          SLP LONG TERM GOAL #2    Title  Pt will improve glottal closureto reduce penetration/aspiration duirng the swalow as measured by reduced s/s of aspiration with regular/thin.      Time  7     Period  Weeks     Status  On-going              Plan - 01/23/18 0933     Clinical Impression Statement  Today, Mr. Gropp required cues during POs to place chin in a tucked position and for appropriate sip sizes when distracted with conversation with SLP. Pt was independent after this. Due to reduced cognition, pt will likely require ongoing cueing for swallow precautions in his natural/home environment. If pt maintains level of awareness with precautions and good success with HEP SLP suggests reduction to x1/week beginning the next ST session. Pt may be sensing aspirates moreso now than during modified (MBSS) due to decr in frequency of aspiration and thus now decr'd frequency of overstimulation of those  receptors in pharyngeal/laryngeal cavities. I recommend skilled ST to reduce risk of aspiration pna and maximize airway protection during meals.         Remaining deficits: Unknown, pt not seen in clinic after 01-23-18.   Education / Equipment: Recommended diet level/liquids, chin tuck posture and other swallow precautions.   Plan: Patient agrees to discharge.  Patient goals were not met. Patient is being discharged due to not returning since the last visit.  ?????      [place] ,MS, CCC-SLP  12/01/2018, 11:29 AM  [facility] [address] [place], [place], [address] Phone: [phone]   Fax:  [phone]

## 2019-01-04 ENCOUNTER — Ambulatory Visit: Payer: Medicare Other | Admitting: Neurology

## 2019-02-12 ENCOUNTER — Other Ambulatory Visit: Payer: Self-pay | Admitting: Neurology

## 2019-02-12 ENCOUNTER — Telehealth: Payer: Self-pay | Admitting: Neurology

## 2019-02-12 DIAGNOSIS — G4733 Obstructive sleep apnea (adult) (pediatric): Secondary | ICD-10-CM

## 2019-02-12 NOTE — Telephone Encounter (Signed)
OK - will let [facility] know.

## 2019-02-12 NOTE — Telephone Encounter (Signed)
Order placed to [facility]. Order also sent to [facility] making them aware that CPAP is DC and they can pick machine up

## 2019-02-12 NOTE — Telephone Encounter (Signed)
Pt's daughter called stating that her father is not using the cpap machine and they are needing a prescription sent to the cpap company for them to come pick it up. Please advise.

## 2019-03-12 ENCOUNTER — Other Ambulatory Visit: Payer: Self-pay | Admitting: Neurology

## 2019-04-12 ENCOUNTER — Ambulatory Visit: Payer: Medicare Other | Admitting: Neurology

## 2019-05-03 ENCOUNTER — Telehealth: Payer: Self-pay | Admitting: Neurology

## 2019-05-03 MED ORDER — MEMANTINE HCL 10 MG PO TABS: 10.0000 mg | ORAL_TABLET | Freq: Two times a day (BID) | ORAL | 3 refills | Status: DC

## 2019-05-03 NOTE — Telephone Encounter (Signed)
Lori from [facility] called and stated that the patient has changed pharmacies. He is now located at [facility] and they are on escript. Please use this location from now on. She would also like to know if they can get a script sent over from [facility]. DW

## 2019-05-03 NOTE — Telephone Encounter (Signed)
E-scribed rx to [facility] as requested. Updated pharmacy in pt chart.

## 2019-06-19 ENCOUNTER — Inpatient Hospital Stay (HOSPITAL_COMMUNITY)
Admission: EM | Admit: 2019-06-19 | Discharge: 2019-06-23 | DRG: 481 | Disposition: A | Discharge status: Skilled Nursing Facility | Payer: Medicare Other | Attending: Family Medicine | Admitting: Family Medicine

## 2019-06-19 ENCOUNTER — Emergency Department (HOSPITAL_COMMUNITY): Payer: Medicare Other

## 2019-06-19 ENCOUNTER — Other Ambulatory Visit: Payer: Self-pay

## 2019-06-19 ENCOUNTER — Encounter (HOSPITAL_COMMUNITY): Payer: Self-pay

## 2019-06-19 DIAGNOSIS — S41111A Laceration without foreign body of right upper arm, initial encounter: Secondary | ICD-10-CM | POA: Diagnosis not present

## 2019-06-19 DIAGNOSIS — S72002A Fracture of unspecified part of neck of left femur, initial encounter for closed fracture: Secondary | ICD-10-CM

## 2019-06-19 DIAGNOSIS — S72032A Displaced midcervical fracture of left femur, initial encounter for closed fracture: Principal | ICD-10-CM | POA: Diagnosis present

## 2019-06-19 DIAGNOSIS — Z01811 Encounter for preprocedural respiratory examination: Secondary | ICD-10-CM

## 2019-06-19 DIAGNOSIS — I69359 Hemiplegia and hemiparesis following cerebral infarction affecting unspecified side: Secondary | ICD-10-CM

## 2019-06-19 DIAGNOSIS — W010XXA Fall on same level from slipping, tripping and stumbling without subsequent striking against object, initial encounter: Secondary | ICD-10-CM | POA: Diagnosis present

## 2019-06-19 DIAGNOSIS — M25552 Pain in left hip: Secondary | ICD-10-CM | POA: Diagnosis not present

## 2019-06-19 DIAGNOSIS — Z419 Encounter for procedure for purposes other than remedying health state, unspecified: Secondary | ICD-10-CM

## 2019-06-19 DIAGNOSIS — Y92009 Unspecified place in unspecified non-institutional (private) residence as the place of occurrence of the external cause: Secondary | ICD-10-CM

## 2019-06-19 DIAGNOSIS — Z20828 Contact with and (suspected) exposure to other viral communicable diseases: Secondary | ICD-10-CM | POA: Diagnosis present

## 2019-06-19 DIAGNOSIS — Z96649 Presence of unspecified artificial hip joint: Secondary | ICD-10-CM

## 2019-06-19 DIAGNOSIS — R52 Pain, unspecified: Secondary | ICD-10-CM

## 2019-06-19 DIAGNOSIS — E785 Hyperlipidemia, unspecified: Secondary | ICD-10-CM | POA: Diagnosis present

## 2019-06-19 DIAGNOSIS — Y92230 Patient room in hospital as the place of occurrence of the external cause: Secondary | ICD-10-CM | POA: Diagnosis not present

## 2019-06-19 DIAGNOSIS — S63254A Unspecified dislocation of right ring finger, initial encounter: Secondary | ICD-10-CM | POA: Diagnosis not present

## 2019-06-19 DIAGNOSIS — F0393 Unspecified dementia, unspecified severity, with mood disturbance: Secondary | ICD-10-CM | POA: Diagnosis present

## 2019-06-19 DIAGNOSIS — I129 Hypertensive chronic kidney disease with stage 1 through stage 4 chronic kidney disease, or unspecified chronic kidney disease: Secondary | ICD-10-CM | POA: Diagnosis present

## 2019-06-19 DIAGNOSIS — F0280 Dementia in other diseases classified elsewhere without behavioral disturbance: Secondary | ICD-10-CM | POA: Diagnosis present

## 2019-06-19 DIAGNOSIS — F329 Major depressive disorder, single episode, unspecified: Secondary | ICD-10-CM | POA: Diagnosis present

## 2019-06-19 DIAGNOSIS — W19XXXA Unspecified fall, initial encounter: Secondary | ICD-10-CM

## 2019-06-19 DIAGNOSIS — N183 Chronic kidney disease, stage 3 unspecified: Secondary | ICD-10-CM | POA: Diagnosis present

## 2019-06-19 DIAGNOSIS — Z66 Do not resuscitate: Secondary | ICD-10-CM | POA: Diagnosis present

## 2019-06-19 DIAGNOSIS — Z7982 Long term (current) use of aspirin: Secondary | ICD-10-CM

## 2019-06-19 DIAGNOSIS — Z79899 Other long term (current) drug therapy: Secondary | ICD-10-CM

## 2019-06-19 DIAGNOSIS — D62 Acute posthemorrhagic anemia: Secondary | ICD-10-CM | POA: Diagnosis not present

## 2019-06-19 DIAGNOSIS — F0390 Unspecified dementia without behavioral disturbance: Secondary | ICD-10-CM | POA: Diagnosis present

## 2019-06-19 DIAGNOSIS — G4733 Obstructive sleep apnea (adult) (pediatric): Secondary | ICD-10-CM | POA: Diagnosis present

## 2019-06-19 DIAGNOSIS — I10 Essential (primary) hypertension: Secondary | ICD-10-CM | POA: Diagnosis present

## 2019-06-19 DIAGNOSIS — S63274A Dislocation of unspecified interphalangeal joint of right ring finger, initial encounter: Secondary | ICD-10-CM

## 2019-06-19 DIAGNOSIS — I69351 Hemiplegia and hemiparesis following cerebral infarction affecting right dominant side: Secondary | ICD-10-CM

## 2019-06-19 DIAGNOSIS — Z9989 Dependence on other enabling machines and devices: Secondary | ICD-10-CM

## 2019-06-19 LAB — CBC WITH DIFFERENTIAL/PLATELET
Abs Immature Granulocytes: 0.03 10*3/uL (ref 0.00–0.07)
Basophils Absolute: 0.1 10*3/uL (ref 0.0–0.1)
Basophils Relative: 1 %
Eosinophils Absolute: 0.2 10*3/uL (ref 0.0–0.5)
Eosinophils Relative: 2 %
HCT: 42.6 % (ref 39.0–52.0)
Hemoglobin: 13.4 g/dL (ref 13.0–17.0)
Immature Granulocytes: 0 %
Lymphocytes Relative: 12 %
Lymphs Abs: 1.2 10*3/uL (ref 0.7–4.0)
MCH: 29.3 pg (ref 26.0–34.0)
MCHC: 31.5 g/dL (ref 30.0–36.0)
MCV: 93.2 fL (ref 80.0–100.0)
Monocytes Absolute: 0.6 10*3/uL (ref 0.1–1.0)
Monocytes Relative: 6 %
Neutro Abs: 7.6 10*3/uL (ref 1.7–7.7)
Neutrophils Relative %: 79 %
Platelets: 332 10*3/uL (ref 150–400)
RBC: 4.57 MIL/uL (ref 4.22–5.81)
RDW: 17 % — ABNORMAL HIGH (ref 11.5–15.5)
WBC: 9.7 10*3/uL (ref 4.0–10.5)
nRBC: 0 % (ref 0.0–0.2)

## 2019-06-19 NOTE — ED Provider Notes (Signed)
[facility]-EMERGENCY DEPT Provider Note   CSN: [id number] Arrival date & time: 06/19/19  2144     History   Chief Complaint Chief Complaint  Patient presents with  . Fall    HPI [name] is a 78 y.o. male.     Patient presents to the emergency department with a chief complaint of fall.  He states that he has residual right-sided deficits after having had a stroke many years ago.  He ambulates with a walker.  He states that he lost his balance, falling and landing on his left hip.  He complains of moderate pain about the left hip.  States that he is unable to ambulate due to pain.  He denies any other injuries.  Denies striking his head or losing consciousness.  POA Broadus John [phone]  The history is provided by the patient. No language interpreter was used.    Past Medical History:  Diagnosis Date  . CKD (chronic kidney disease), stage II   . CVA (cerebral infarction)    Left thalamic, right-sided weakness wih aphagia, right leg brace  . Dyslipidemia   . Elevated PSA    biopsy negative in 2008  . GERD (gastroesophageal reflux disease)   . Hypertension    without medication since 2006  . Major depression   . Seasonal rhinitis     Patient Active Problem List   Diagnosis Date Noted  . OSA on CPAP 07/05/2018  . Memory disturbance 07/05/2018  . Excessive daytime sleepiness 04/03/2018  . 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 Snoring 04/03/2018  . Hemiplegia affecting dominant side, post-stroke (HCC) 03/07/2017  . Spastic hemiplegia affecting dominant side (HCC) 02/07/2017  . 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 Right foot drop 12/02/2016  . 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 Hemiplegia  following CVA (cerebrovascular accident) (HCC) 05/31/2014  . 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 Left pontine stroke (HCC) 05/31/2014  . MCI (mild cognitive impairment) with memory loss 05/31/2014    Past Surgical History:  Procedure Laterality Date  . NASAL SEPTUM SURGERY    . VASECTOMY          Home Medications    Prior to Admission medications   Medication Sig Start Date End Date Taking? Authorizing Provider  amLODipine (NORVASC) 2.5 MG tablet Take 2.5 mg by mouth daily. 09/24/16   [provider]  aspirin 81 MG tablet Take 81 mg by mouth every other day.     [provider]  atorvastatin (LIPITOR) 10 MG tablet  04/02/18   [provider]  B Complex Vitamins (VITAMIN B COMPLEX PO) Take by mouth.    [provider]  buPROPion (WELLBUTRIN XL) 150 MG 24 hr tablet TAKE 1 TABLET(150 MG) BY MOUTH DAILY 09/07/17   Dohmeier, Porfirio Mylar, MD  Cholecalciferol (VITAMIN D3) 2000 units TABS Take 2,000 Units by mouth.    [provider]  CRANBERRY EXTRACT PO Take 450 mg by mouth daily.    [provider]  DULoxetine (CYMBALTA) 60 MG capsule Take 60 mg by mouth daily.    [provider]  memantine (NAMENDA) 10 MG tablet Take 1 tablet (10 mg total) by mouth 2 (two) times daily. 05/03/19   Dohmeier, Porfirio Mylar, MD  OVER THE COUNTER MEDICATION  1 tablet daily. Osteo Bi-Flex triple strength    [provider]    Family History Family History  Problem Relation Age of Onset  . Colon cancer Brother     Social History Social History   Tobacco Use  . Smoking status: Never Smoker  . Smokeless tobacco: Never Used  Substance Use Topics  . Alcohol use: No  . Drug use: No     Allergies   Patient has no known allergies.   Review of Systems Review of Systems  All other systems reviewed and are negative.    Physical Exam Updated Vital Signs BP (!) 150/98 (BP Location: Left Arm)   Pulse 72    Temp 98.1 F (36.7 C) (Oral)   Resp 16   Ht 5\' 8"  (1.727 m)   Wt 78 kg   SpO2 97%   BMI 26.15 kg/m   Physical Exam Vitals signs and nursing note reviewed.  Constitutional:      Appearance: He is well-developed.  HENT:     Head: Normocephalic and atraumatic.  Eyes:     Conjunctiva/sclera: Conjunctivae normal.  Neck:     Musculoskeletal: Neck supple.  Cardiovascular:     Rate and Rhythm: Normal rate and regular rhythm.     Heart sounds: No murmur.     Comments: Intact distal pulses Pulmonary:     Effort: Pulmonary effort is normal. No respiratory distress.     Breath sounds: Normal breath sounds.  Abdominal:     Palpations: Abdomen is soft.     Tenderness: There is no abdominal tenderness.  Musculoskeletal:     Comments: ROM of left hip 4/5 limited by pain No bony deformity  Skin:    General: Skin is warm and dry.  Neurological:     Mental Status: He is alert.      ED Treatments / Results  Labs (all labs ordered are listed, but only abnormal results are displayed) Labs Reviewed  CBC WITH DIFFERENTIAL/PLATELET - Abnormal; Notable for the following components:      Result Value   RDW 17.0 (*)    All other components within normal limits  BASIC METABOLIC PANEL - Abnormal; Notable for the following components:   Glucose, Bld 121 (*)    Creatinine, Ser 1.36 (*)    GFR calc non Af Amer 50 (*)    GFR calc Af Amer 58 (*)    All other components within normal limits  SARS CORONAVIRUS 2 (TAT 6-24 HRS)    EKG None  Radiology Dg Hip Unilat W Or W/o Pelvis Min 4 Views Left  Result Date: 06/19/2019 CLINICAL DATA:  Fall EXAM: DG HIP (WITH OR WITHOUT PELVIS) 4+V LEFT COMPARISON:  CT abdomen pelvis 08/20/2016 FINDINGS: There is an impacted and slightly externally rotated transcervical left femoral neck fracture with overlying soft tissue swelling. The left femoral head remains normally located. Remaining bones of the pelvis are congruent. The proximal right femur is  intact. Bowel gas pattern is unremarkable. IMPRESSION: Impacted and slightly externally rotated transcervical left femoral neck fracture with overlying soft tissue swelling. Electronically Signed   By: Kreg ShropshirePrice  DeHay M.D.   On: 06/19/2019 23:10    Procedures Procedures (including critical care time)  Medications Ordered in ED Medications - No data to display   Initial Impression / Assessment and Plan / ED Course  I have reviewed the triage vital signs and the nursing notes.  Pertinent labs & imaging results that were available during my care of the patient  were reviewed by me and considered in my medical decision making (see chart for details).       Patient with left hip pain.  Presumed mechanical fall from standing today.  Complains of left hip pain.  Denies any head injury.  Takes a baby aspirin, but is not otherwise anticoagulated.  Plain films reviewed, show left femoral neck fracture.  Discussed with Dr. Erlinda Hong, who recommends admission and transfer to [facility].  Appreciate Dr. Hal Hope for admitting the patient.  POA Ervin Knack [phone]  Final Clinical Impressions(s) / ED Diagnoses   Final diagnoses:  Closed fracture of neck of left femur, initial encounter [facility])    ED Discharge Orders    None       Montine Circle, PA-C 06/20/19 Clyda Greener, MD 06/23/19 1040

## 2019-06-19 NOTE — ED Triage Notes (Signed)
Bib ems from home. Pervious stroke rigth sided weakness had fall denies loc, denies hitting head.  Reports left hip pain. No shorten or rotation noted by ems. Not able to bear weight normally ambulatory.    160/93 78 18 98%

## 2019-06-19 NOTE — ED Notes (Signed)
Patient transported to X-ray 

## 2019-06-20 ENCOUNTER — Inpatient Hospital Stay (HOSPITAL_COMMUNITY): Payer: Medicare Other

## 2019-06-20 ENCOUNTER — Encounter (HOSPITAL_COMMUNITY): Payer: Self-pay | Admitting: Internal Medicine

## 2019-06-20 ENCOUNTER — Encounter (HOSPITAL_COMMUNITY)
Admission: EM | Disposition: A | Discharge status: Skilled Nursing Facility | Payer: Self-pay | Source: Home / Self Care | Attending: Family Medicine

## 2019-06-20 ENCOUNTER — Inpatient Hospital Stay (HOSPITAL_COMMUNITY): Payer: Medicare Other | Admitting: Certified Registered Nurse Anesthetist

## 2019-06-20 DIAGNOSIS — Z66 Do not resuscitate: Secondary | ICD-10-CM | POA: Diagnosis present

## 2019-06-20 DIAGNOSIS — S63274A Dislocation of unspecified interphalangeal joint of right ring finger, initial encounter: Secondary | ICD-10-CM | POA: Diagnosis not present

## 2019-06-20 DIAGNOSIS — F329 Major depressive disorder, single episode, unspecified: Secondary | ICD-10-CM | POA: Diagnosis present

## 2019-06-20 DIAGNOSIS — I69351 Hemiplegia and hemiparesis following cerebral infarction affecting right dominant side: Secondary | ICD-10-CM | POA: Diagnosis not present

## 2019-06-20 DIAGNOSIS — I129 Hypertensive chronic kidney disease with stage 1 through stage 4 chronic kidney disease, or unspecified chronic kidney disease: Secondary | ICD-10-CM | POA: Diagnosis present

## 2019-06-20 DIAGNOSIS — M25552 Pain in left hip: Secondary | ICD-10-CM | POA: Diagnosis present

## 2019-06-20 DIAGNOSIS — Z79899 Other long term (current) drug therapy: Secondary | ICD-10-CM | POA: Diagnosis not present

## 2019-06-20 DIAGNOSIS — S72002A Fracture of unspecified part of neck of left femur, initial encounter for closed fracture: Secondary | ICD-10-CM | POA: Diagnosis not present

## 2019-06-20 DIAGNOSIS — Z20828 Contact with and (suspected) exposure to other viral communicable diseases: Secondary | ICD-10-CM | POA: Diagnosis present

## 2019-06-20 DIAGNOSIS — S63254A Unspecified dislocation of right ring finger, initial encounter: Secondary | ICD-10-CM | POA: Diagnosis not present

## 2019-06-20 DIAGNOSIS — I10 Essential (primary) hypertension: Secondary | ICD-10-CM

## 2019-06-20 DIAGNOSIS — Z7982 Long term (current) use of aspirin: Secondary | ICD-10-CM | POA: Diagnosis not present

## 2019-06-20 DIAGNOSIS — G4733 Obstructive sleep apnea (adult) (pediatric): Secondary | ICD-10-CM | POA: Diagnosis present

## 2019-06-20 DIAGNOSIS — F0390 Unspecified dementia without behavioral disturbance: Secondary | ICD-10-CM | POA: Diagnosis present

## 2019-06-20 DIAGNOSIS — Y92009 Unspecified place in unspecified non-institutional (private) residence as the place of occurrence of the external cause: Secondary | ICD-10-CM | POA: Diagnosis not present

## 2019-06-20 DIAGNOSIS — N183 Chronic kidney disease, stage 3 unspecified: Secondary | ICD-10-CM | POA: Diagnosis present

## 2019-06-20 DIAGNOSIS — D62 Acute posthemorrhagic anemia: Secondary | ICD-10-CM | POA: Diagnosis not present

## 2019-06-20 DIAGNOSIS — Y92230 Patient room in hospital as the place of occurrence of the external cause: Secondary | ICD-10-CM | POA: Diagnosis not present

## 2019-06-20 DIAGNOSIS — W010XXA Fall on same level from slipping, tripping and stumbling without subsequent striking against object, initial encounter: Secondary | ICD-10-CM | POA: Diagnosis present

## 2019-06-20 DIAGNOSIS — E785 Hyperlipidemia, unspecified: Secondary | ICD-10-CM | POA: Diagnosis present

## 2019-06-20 DIAGNOSIS — S41111A Laceration without foreign body of right upper arm, initial encounter: Secondary | ICD-10-CM | POA: Diagnosis not present

## 2019-06-20 DIAGNOSIS — S72032A Displaced midcervical fracture of left femur, initial encounter for closed fracture: Secondary | ICD-10-CM | POA: Diagnosis present

## 2019-06-20 HISTORY — PX: HIP PINNING,CANNULATED: SHX1758

## 2019-06-20 LAB — CBC
HCT: 38.6 % — ABNORMAL LOW (ref 39.0–52.0)
Hemoglobin: 12.7 g/dL — ABNORMAL LOW (ref 13.0–17.0)
MCH: 30 pg (ref 26.0–34.0)
MCHC: 32.9 g/dL (ref 30.0–36.0)
MCV: 91.3 fL (ref 80.0–100.0)
Platelets: 298 10*3/uL (ref 150–400)
RBC: 4.23 MIL/uL (ref 4.22–5.81)
RDW: 17.1 % — ABNORMAL HIGH (ref 11.5–15.5)
WBC: 12.7 10*3/uL — ABNORMAL HIGH (ref 4.0–10.5)
nRBC: 0 % (ref 0.0–0.2)

## 2019-06-20 LAB — URINALYSIS, ROUTINE W REFLEX MICROSCOPIC
Bacteria, UA: NONE SEEN
Bilirubin Urine: NEGATIVE
Glucose, UA: NEGATIVE mg/dL
Hgb urine dipstick: NEGATIVE
Ketones, ur: NEGATIVE mg/dL
Nitrite: NEGATIVE
Protein, ur: NEGATIVE mg/dL
Specific Gravity, Urine: 1.011 (ref 1.005–1.030)
pH: 7 (ref 5.0–8.0)

## 2019-06-20 LAB — BASIC METABOLIC PANEL
Anion gap: 9 (ref 5–15)
BUN: 18 mg/dL (ref 8–23)
CO2: 26 mmol/L (ref 22–32)
Calcium: 9.4 mg/dL (ref 8.9–10.3)
Chloride: 106 mmol/L (ref 98–111)
Creatinine, Ser: 1.36 mg/dL — ABNORMAL HIGH (ref 0.61–1.24)
GFR calc Af Amer: 58 mL/min — ABNORMAL LOW (ref >60)
GFR calc non Af Amer: 50 mL/min — ABNORMAL LOW (ref >60)
Glucose, Bld: 121 mg/dL — ABNORMAL HIGH (ref 70–99)
Potassium: 4.1 mmol/L (ref 3.5–5.1)
Sodium: 141 mmol/L (ref 135–145)

## 2019-06-20 LAB — CBG MONITORING, ED: Glucose-Capillary: 133 mg/dL — ABNORMAL HIGH (ref 70–99)

## 2019-06-20 LAB — CREATININE, SERUM
Creatinine, Ser: 1.46 mg/dL — ABNORMAL HIGH (ref 0.61–1.24)
GFR calc Af Amer: 53 mL/min — ABNORMAL LOW (ref >60)
GFR calc non Af Amer: 46 mL/min — ABNORMAL LOW (ref >60)

## 2019-06-20 LAB — GLUCOSE, CAPILLARY: Glucose-Capillary: 212 mg/dL — ABNORMAL HIGH (ref 70–99)

## 2019-06-20 LAB — SARS CORONAVIRUS 2 (TAT 6-24 HRS): SARS Coronavirus 2: NEGATIVE

## 2019-06-20 SURGERY — FIXATION, FEMUR, NECK, PERCUTANEOUS, USING SCREW
Anesthesia: General | Site: Hip | Laterality: Left

## 2019-06-20 MED ORDER — PHENOL 1.4 % MT LIQD: 1.0000 | OROMUCOSAL | Status: DC | PRN

## 2019-06-20 MED ORDER — ONDANSETRON HCL 4 MG/2ML IJ SOLN: 4.0000 mg | Freq: Once | INTRAMUSCULAR | Status: DC | PRN

## 2019-06-20 MED ORDER — POLYETHYLENE GLYCOL 3350 17 G PO PACK: 17.0000 g | PACK | Freq: Every day | ORAL | Status: DC | PRN

## 2019-06-20 MED ORDER — STERILE WATER FOR IRRIGATION IR SOLN
Status: DC | PRN
  Administered 2019-06-20: 1000 mL

## 2019-06-20 MED ORDER — OXYCODONE-ACETAMINOPHEN 5-325 MG PO TABS: 1.0000 | ORAL_TABLET | Freq: Three times a day (TID) | ORAL | 0 refills | Status: DC | PRN

## 2019-06-20 MED ORDER — DEXAMETHASONE SODIUM PHOSPHATE 10 MG/ML IJ SOLN
INTRAMUSCULAR | Status: AC
  Filled 2019-06-20: qty 1

## 2019-06-20 MED ORDER — ENOXAPARIN SODIUM 40 MG/0.4ML ~~LOC~~ SOLN
40.0000 mg | SUBCUTANEOUS | Status: DC
  Administered 2019-06-22 – 2019-06-23 (×2): 40 mg via SUBCUTANEOUS
  Filled 2019-06-20 (×2): qty 0.4

## 2019-06-20 MED ORDER — ACETAMINOPHEN 325 MG PO TABS
325.0000 mg | ORAL_TABLET | Freq: Four times a day (QID) | ORAL | Status: DC | PRN
  Administered 2019-06-21 – 2019-06-22 (×2): 650 mg via ORAL
  Filled 2019-06-20 (×3): qty 2

## 2019-06-20 MED ORDER — TRANEXAMIC ACID-NACL 1000-0.7 MG/100ML-% IV SOLN
INTRAVENOUS | Status: AC
  Filled 2019-06-20: qty 100

## 2019-06-20 MED ORDER — ONDANSETRON HCL 4 MG/2ML IJ SOLN
INTRAMUSCULAR | Status: DC | PRN
  Administered 2019-06-20: 4 mg via INTRAVENOUS

## 2019-06-20 MED ORDER — SORBITOL 70 % SOLN: 30.0000 mL | Freq: Every day | Status: DC | PRN

## 2019-06-20 MED ORDER — LACTATED RINGERS IV SOLN
INTRAVENOUS | Status: DC
  Administered 2019-06-20 (×2): via INTRAVENOUS

## 2019-06-20 MED ORDER — FENTANYL CITRATE (PF) 100 MCG/2ML IJ SOLN
50.0000 ug | Freq: Once | INTRAMUSCULAR | Status: AC
  Administered 2019-06-20: 50 ug via INTRAVENOUS
  Filled 2019-06-20: qty 2

## 2019-06-20 MED ORDER — HYDROCODONE-ACETAMINOPHEN 7.5-325 MG PO TABS: 1.0000 | ORAL_TABLET | ORAL | Status: DC | PRN

## 2019-06-20 MED ORDER — MENTHOL 3 MG MT LOZG: 1.0000 | LOZENGE | OROMUCOSAL | Status: DC | PRN

## 2019-06-20 MED ORDER — ATORVASTATIN CALCIUM 10 MG PO TABS
10.0000 mg | ORAL_TABLET | Freq: Every day | ORAL | Status: DC
  Administered 2019-06-21 – 2019-06-23 (×3): 10 mg via ORAL
  Filled 2019-06-20 (×4): qty 1

## 2019-06-20 MED ORDER — ONDANSETRON HCL 4 MG PO TABS: 4.0000 mg | ORAL_TABLET | Freq: Four times a day (QID) | ORAL | Status: DC | PRN

## 2019-06-20 MED ORDER — OXYCODONE HCL 5 MG PO TABS: 5.0000 mg | ORAL_TABLET | Freq: Once | ORAL | Status: DC | PRN

## 2019-06-20 MED ORDER — METHOCARBAMOL 500 MG PO TABS: 500.0000 mg | ORAL_TABLET | Freq: Four times a day (QID) | ORAL | Status: DC | PRN

## 2019-06-20 MED ORDER — ONDANSETRON HCL 4 MG/2ML IJ SOLN: 4.0000 mg | Freq: Four times a day (QID) | INTRAMUSCULAR | Status: DC | PRN

## 2019-06-20 MED ORDER — ROCURONIUM BROMIDE 10 MG/ML (PF) SYRINGE
PREFILLED_SYRINGE | INTRAVENOUS | Status: AC
  Filled 2019-06-20: qty 30

## 2019-06-20 MED ORDER — CEFAZOLIN SODIUM-DEXTROSE 2-4 GM/100ML-% IV SOLN
INTRAVENOUS | Status: AC
  Filled 2019-06-20: qty 100

## 2019-06-20 MED ORDER — DOCUSATE SODIUM 100 MG PO CAPS
100.0000 mg | ORAL_CAPSULE | Freq: Two times a day (BID) | ORAL | Status: DC
  Administered 2019-06-20 – 2019-06-23 (×6): 100 mg via ORAL
  Filled 2019-06-20 (×6): qty 1

## 2019-06-20 MED ORDER — 0.9 % SODIUM CHLORIDE (POUR BTL) OPTIME
TOPICAL | Status: DC | PRN
  Administered 2019-06-20: 1000 mL

## 2019-06-20 MED ORDER — CEFAZOLIN SODIUM-DEXTROSE 2-4 GM/100ML-% IV SOLN
2.0000 g | INTRAVENOUS | Status: AC
  Administered 2019-06-20: 18:00:00 2 g via INTRAVENOUS

## 2019-06-20 MED ORDER — PHENYLEPHRINE 40 MCG/ML (10ML) SYRINGE FOR IV PUSH (FOR BLOOD PRESSURE SUPPORT)
PREFILLED_SYRINGE | INTRAVENOUS | Status: DC | PRN
  Administered 2019-06-20 (×2): 80 ug via INTRAVENOUS

## 2019-06-20 MED ORDER — BUPROPION HCL ER (XL) 150 MG PO TB24
150.0000 mg | ORAL_TABLET | Freq: Every day | ORAL | Status: DC
  Administered 2019-06-20 – 2019-06-23 (×4): 150 mg via ORAL
  Filled 2019-06-20 (×4): qty 1

## 2019-06-20 MED ORDER — ROCURONIUM BROMIDE 10 MG/ML (PF) SYRINGE
PREFILLED_SYRINGE | INTRAVENOUS | Status: AC
  Filled 2019-06-20: qty 10

## 2019-06-20 MED ORDER — ENOXAPARIN SODIUM 40 MG/0.4ML ~~LOC~~ SOLN: 40.0000 mg | Freq: Every day | SUBCUTANEOUS | 13 refills | Status: DC

## 2019-06-20 MED ORDER — EPHEDRINE 5 MG/ML INJ
INTRAVENOUS | Status: AC
  Filled 2019-06-20: qty 30

## 2019-06-20 MED ORDER — FENTANYL CITRATE (PF) 100 MCG/2ML IJ SOLN
INTRAMUSCULAR | Status: DC | PRN
  Administered 2019-06-20: 50 ug via INTRAVENOUS

## 2019-06-20 MED ORDER — SUGAMMADEX SODIUM 200 MG/2ML IV SOLN
INTRAVENOUS | Status: DC | PRN
  Administered 2019-06-20: 200 mg via INTRAVENOUS

## 2019-06-20 MED ORDER — TRANEXAMIC ACID 1000 MG/10ML IV SOLN
2000.0000 mg | INTRAVENOUS | Status: DC
  Filled 2019-06-20: qty 20

## 2019-06-20 MED ORDER — POVIDONE-IODINE 10 % EX SWAB: 2.0000 "application " | Freq: Once | CUTANEOUS | Status: DC

## 2019-06-20 MED ORDER — SODIUM CHLORIDE 0.9 % IV SOLN
INTRAVENOUS | Status: DC
  Administered 2019-06-20 – 2019-06-21 (×2): via INTRAVENOUS

## 2019-06-20 MED ORDER — DULOXETINE HCL 60 MG PO CPEP
60.0000 mg | ORAL_CAPSULE | Freq: Every day | ORAL | Status: DC
  Administered 2019-06-20 – 2019-06-23 (×4): 60 mg via ORAL
  Filled 2019-06-20: qty 1
  Filled 2019-06-20: qty 2
  Filled 2019-06-20 (×2): qty 1

## 2019-06-20 MED ORDER — ONDANSETRON HCL 4 MG/2ML IJ SOLN
INTRAMUSCULAR | Status: AC
  Filled 2019-06-20: qty 2

## 2019-06-20 MED ORDER — TRANEXAMIC ACID-NACL 1000-0.7 MG/100ML-% IV SOLN
1000.0000 mg | INTRAVENOUS | Status: AC
  Administered 2019-06-20: 1000 mg via INTRAVENOUS

## 2019-06-20 MED ORDER — HYDROCODONE-ACETAMINOPHEN 5-325 MG PO TABS: 1.0000 | ORAL_TABLET | ORAL | Status: DC | PRN

## 2019-06-20 MED ORDER — LIDOCAINE 2% (20 MG/ML) 5 ML SYRINGE
INTRAMUSCULAR | Status: DC | PRN
  Administered 2019-06-20: 60 mg via INTRAVENOUS

## 2019-06-20 MED ORDER — PROPOFOL 10 MG/ML IV BOLUS
INTRAVENOUS | Status: DC | PRN
  Administered 2019-06-20: 60 mg via INTRAVENOUS

## 2019-06-20 MED ORDER — METHOCARBAMOL 1000 MG/10ML IJ SOLN
500.0000 mg | Freq: Four times a day (QID) | INTRAVENOUS | Status: DC | PRN
  Filled 2019-06-20: qty 5

## 2019-06-20 MED ORDER — FENTANYL CITRATE (PF) 250 MCG/5ML IJ SOLN
INTRAMUSCULAR | Status: AC
  Filled 2019-06-20: qty 5

## 2019-06-20 MED ORDER — DEXAMETHASONE SODIUM PHOSPHATE 10 MG/ML IJ SOLN
INTRAMUSCULAR | Status: DC | PRN
  Administered 2019-06-20: 10 mg via INTRAVENOUS

## 2019-06-20 MED ORDER — CEFAZOLIN SODIUM-DEXTROSE 2-4 GM/100ML-% IV SOLN
2.0000 g | Freq: Four times a day (QID) | INTRAVENOUS | Status: AC
  Administered 2019-06-20 – 2019-06-21 (×3): 2 g via INTRAVENOUS
  Filled 2019-06-20 (×3): qty 100

## 2019-06-20 MED ORDER — ROCURONIUM BROMIDE 100 MG/10ML IV SOLN
INTRAVENOUS | Status: DC | PRN
  Administered 2019-06-20: 20 mg via INTRAVENOUS
  Administered 2019-06-20: 40 mg via INTRAVENOUS

## 2019-06-20 MED ORDER — MAGNESIUM CITRATE PO SOLN: 1.0000 | Freq: Once | ORAL | Status: DC | PRN

## 2019-06-20 MED ORDER — ETOMIDATE 2 MG/ML IV SOLN
INTRAVENOUS | Status: AC
  Filled 2019-06-20: qty 10

## 2019-06-20 MED ORDER — MORPHINE SULFATE (PF) 2 MG/ML IV SOLN
0.5000 mg | INTRAVENOUS | Status: DC | PRN
  Administered 2019-06-20 (×3): 0.5 mg via INTRAVENOUS
  Filled 2019-06-20 (×3): qty 1

## 2019-06-20 MED ORDER — ACETAMINOPHEN 500 MG PO TABS
500.0000 mg | ORAL_TABLET | Freq: Four times a day (QID) | ORAL | Status: AC
  Administered 2019-06-20 – 2019-06-21 (×4): 500 mg via ORAL
  Filled 2019-06-20 (×4): qty 1

## 2019-06-20 MED ORDER — OXYCODONE HCL 5 MG/5ML PO SOLN: 5.0000 mg | Freq: Once | ORAL | Status: DC | PRN

## 2019-06-20 MED ORDER — ALUM & MAG HYDROXIDE-SIMETH 200-200-20 MG/5ML PO SUSP: 30.0000 mL | ORAL | Status: DC | PRN

## 2019-06-20 MED ORDER — MEMANTINE HCL 10 MG PO TABS
10.0000 mg | ORAL_TABLET | Freq: Two times a day (BID) | ORAL | Status: DC
  Administered 2019-06-20 – 2019-06-23 (×6): 10 mg via ORAL
  Filled 2019-06-20 (×8): qty 1

## 2019-06-20 MED ORDER — AMLODIPINE BESYLATE 2.5 MG PO TABS
2.5000 mg | ORAL_TABLET | Freq: Every day | ORAL | Status: DC
  Administered 2019-06-20 – 2019-06-23 (×4): 2.5 mg via ORAL
  Filled 2019-06-20 (×4): qty 1

## 2019-06-20 MED ORDER — MORPHINE SULFATE (PF) 2 MG/ML IV SOLN: 1.0000 mg | INTRAVENOUS | Status: DC | PRN

## 2019-06-20 MED ORDER — FENTANYL CITRATE (PF) 100 MCG/2ML IJ SOLN: 25.0000 ug | INTRAMUSCULAR | Status: DC | PRN

## 2019-06-20 SURGICAL SUPPLY — 36 items
BIT DRILL 5.0 QC 6.5 (BIT) ×1 IMPLANT
BIT DRILL 5.0 QC 6.5MM (BIT) ×1
BNDG COHESIVE 4X5 TAN STRL (GAUZE/BANDAGES/DRESSINGS) ×3 IMPLANT
BNDG GAUZE ELAST 4 BULKY (GAUZE/BANDAGES/DRESSINGS) ×3 IMPLANT
COVER PERINEAL POST (MISCELLANEOUS) ×3 IMPLANT
COVER WAND RF STERILE (DRAPES) ×3 IMPLANT
DRAPE STERI IOBAN 125X83 (DRAPES) ×3 IMPLANT
DRSG ADAPTIC 3X8 NADH LF (GAUZE/BANDAGES/DRESSINGS) ×3 IMPLANT
DRSG MEPILEX BORDER 4X4 (GAUZE/BANDAGES/DRESSINGS) ×3 IMPLANT
DURAPREP 26ML APPLICATOR (WOUND CARE) ×3 IMPLANT
ELECT CAUTERY BLADE 6.4 (BLADE) ×3 IMPLANT
ELECT REM PT RETURN 9FT ADLT (ELECTROSURGICAL) ×3
ELECTRODE REM PT RTRN 9FT ADLT (ELECTROSURGICAL) ×1 IMPLANT
GLOVE BIOGEL PI IND STRL 7.0 (GLOVE) ×1 IMPLANT
GLOVE BIOGEL PI INDICATOR 7.0 (GLOVE) ×2
GLOVE ECLIPSE 7.0 STRL STRAW (GLOVE) ×3 IMPLANT
GLOVE SKINSENSE NS SZ7.5 (GLOVE) ×4
GLOVE SKINSENSE STRL SZ7.5 (GLOVE) ×2 IMPLANT
GOWN STRL REIN XL XLG (GOWN DISPOSABLE) ×9 IMPLANT
KIT BASIN OR (CUSTOM PROCEDURE TRAY) ×3 IMPLANT
KIT TURNOVER KIT B (KITS) ×3 IMPLANT
MANIFOLD NEPTUNE II (INSTRUMENTS) ×3 IMPLANT
NS IRRIG 1000ML POUR BTL (IV SOLUTION) ×3 IMPLANT
PACK GENERAL/GYN (CUSTOM PROCEDURE TRAY) ×3 IMPLANT
PAD ARMBOARD 7.5X6 YLW CONV (MISCELLANEOUS) ×6 IMPLANT
PIN GUIDE 3.2X300MM (PIN) ×6 IMPLANT
SCREW CANN PT 7.0X105 32MM HIP (Screw) ×4 IMPLANT
SCREW CANNULATED 105X7.0 (Screw) ×2 IMPLANT
STAPLER VISISTAT 35W (STAPLE) IMPLANT
SUT VIC AB 0 CT1 27 (SUTURE) ×3
SUT VIC AB 0 CT1 27XBRD ANBCTR (SUTURE) ×1 IMPLANT
SUT VIC AB 2-0 CT1 27 (SUTURE) ×3
SUT VIC AB 2-0 CT1 TAPERPNT 27 (SUTURE) ×1 IMPLANT
TOWEL GREEN STERILE (TOWEL DISPOSABLE) ×3 IMPLANT
TOWEL GREEN STERILE FF (TOWEL DISPOSABLE) ×3 IMPLANT
WATER STERILE IRR 1000ML POUR (IV SOLUTION) ×3 IMPLANT

## 2019-06-20 NOTE — Consult Note (Signed)
ORTHOPAEDIC CONSULTATION  REQUESTING PHYSICIAN: Arrien, York Ram,*  Chief Complaint: Left femoral neck fracture  HPI: [name] is a 78 y.o. male who presents with left hip fracture s/p mechanical fall.  Patient has dementia.  HPI details from daughter at bedside.  The patient endorses severe pain in the left hip, that does not radiate, grinding in quality, worse with any movement, better with immobilization.  Denies LOC/fever/chills/nausea/vomiting.  Walks with assistive devices (walker, cane, wheelchair).  Denies LOC, neck pain, abd pain.  Past Medical History:  Diagnosis Date  . CKD (chronic kidney disease), stage II   . CVA (cerebral infarction)    Left thalamic, right-sided weakness wih aphagia, right leg brace  . Dyslipidemia   . Elevated PSA    biopsy negative in 2008  . GERD (gastroesophageal reflux disease)   . Hypertension    without medication since 2006  . Major depression   . Seasonal rhinitis    Past Surgical History:  Procedure Laterality Date  . NASAL SEPTUM SURGERY    . VASECTOMY     Social History   Socioeconomic History  . Marital status: Widowed    Spouse name: [name]  . Number of children: 2  . Years of education: 25  . Highest education level: Not on file  Occupational History  . Not on file  Social Needs  . Financial resource strain: Not on file  . Food insecurity    Worry: Not on file    Inability: Not on file  . Transportation needs    Medical: Not on file    Non-medical: Not on file  Tobacco Use  . Smoking status: Never Smoker  . Smokeless tobacco: Never Used  Substance and Sexual Activity  . Alcohol use: No  . Drug use: No  . Sexual activity: Not on file  Lifestyle  . Physical activity    Days per week: Not on file    Minutes per session: Not on file  . Stress: Not on file  Relationships  . Social Musician on phone: Not on file    Gets together: Not on file    Attends religious service: Not on file   Active member of club or organization: Not on file    Attends meetings of clubs or organizations: Not on file    Relationship status: Not on file  Other Topics Concern  . Not on file  Social History Narrative   Patient is married [name]) and lives at home with his wife.   Patient is disabled.    Patient has two adult children and two grandchildren.   Patient has a high school education.   Patient is left-handed.   Patient drinks one cup of coffee daily.            Family History  Problem Relation Age of Onset  . Colon cancer Brother    No Known Allergies Prior to Admission medications   Medication Sig Start Date End Date Taking? Authorizing Provider  amLODipine (NORVASC) 2.5 MG tablet Take 2.5 mg by mouth daily. 09/24/16  Yes [provider]  aspirin 81 MG tablet Take 81 mg by mouth every other day. Sunday, Tuesday, Thursday and Saturday   Yes [provider]  atorvastatin (LIPITOR) 10 MG tablet Take 10 mg by mouth daily.  04/02/18  Yes [provider]  B Complex Vitamins (VITAMIN B COMPLEX PO) Take 1 tablet by mouth daily.    Yes [provider]  buPROPion (  WELLBUTRIN XL) 150 MG 24 hr tablet TAKE 1 TABLET(150 MG) BY MOUTH DAILY 09/07/17  Yes Dohmeier, Asencion Partridge, MD  Cholecalciferol (VITAMIN D3) 2000 units TABS Take 2,000 Units by mouth.   Yes [provider]  CRANBERRY EXTRACT PO Take 450 mg by mouth daily.   Yes [provider]  DULoxetine (CYMBALTA) 60 MG capsule Take 60 mg by mouth daily.   Yes [provider]  memantine (NAMENDA) 10 MG tablet Take 1 tablet (10 mg total) by mouth 2 (two) times daily. 05/03/19  Yes Dohmeier, Asencion Partridge, MD  Misc Natural Products (OSTEO BI-FLEX TRIPLE STRENGTH PO) Take 1 tablet by mouth daily.   Yes [provider]   Chest Portable 1 View  Result Date: 06/20/2019 CLINICAL DATA:  78 year old male with hip fracture. Preoperative study. EXAM: PORTABLE CHEST 1 VIEW COMPARISON:  Portable  chest 04/01/2004. FINDINGS: Portable AP semi upright view at 0204 hours. Lung volumes are within normal limits. Tortuous thoracic aorta. Other mediastinal contours are within normal limits. Visualized tracheal air column is within normal limits. Allowing for portable technique the lungs are clear. No acute osseous abnormality identified. IMPRESSION: No acute cardiopulmonary abnormality. Electronically Signed   By: Genevie Ann M.D.   On: 06/20/2019 03:37   Dg Hip Unilat W Or W/o Pelvis Min 4 Views Left  Result Date: 06/19/2019 CLINICAL DATA:  Fall EXAM: DG HIP (WITH OR WITHOUT PELVIS) 4+V LEFT COMPARISON:  CT abdomen pelvis 08/20/2016 FINDINGS: There is an impacted and slightly externally rotated transcervical left femoral neck fracture with overlying soft tissue swelling. The left femoral head remains normally located. Remaining bones of the pelvis are congruent. The proximal right femur is intact. Bowel gas pattern is unremarkable. IMPRESSION: Impacted and slightly externally rotated transcervical left femoral neck fracture with overlying soft tissue swelling. Electronically Signed   By: Lovena Le M.D.   On: 06/19/2019 23:10    All pertinent xrays, MRI, CT independently reviewed and interpreted  Positive ROS: All other systems have been reviewed and were otherwise negative with the exception of those mentioned in the HPI and as above.  Physical Exam: General: Alert, no acute distress Cardiovascular: No pedal edema Respiratory: No cyanosis, no use of accessory musculature GI: No organomegaly, abdomen is soft and non-tender Skin: No lesions in the area of chief complaint Neurologic: Sensation intact distally Psychiatric: Patient is competent for consent with normal mood and affect Lymphatic: No axillary or cervical lymphadenopathy  MUSCULOSKELETAL:  - severe pain with movement of the hip and extremity - skin intact - NVI distally - compartments soft  Assessment: Left subcapital femoral  neck fracture  Plan: - surgical stabilization is recommended, patient and family are aware of r/b/a and wish to proceed, informed consent obtained - medical optimization per primary team - surgery is planned for today - discussed treatment plan with daughter  Thank you for the consult and the opportunity to see [name]. Eduard Roux, MD [facility] 3:43 PM

## 2019-06-20 NOTE — ED Notes (Signed)
Daughter Ervin Knack [phone]

## 2019-06-20 NOTE — H&P (Signed)
History and Physical    [name] ZOX:[id number] DOB: 03/19/1941 DOA: 06/19/2019  PCP: Georgann HousekeeperHusain, Karrar, MD  Patient coming from: Home.  Chief Complaint: Fall.  History obtained from patient's daughter.  HPI: [name] is a 78 y.o. male with history of dementia, hypertension, hyperlipidemia, history of stroke with right-sided hemiparesis usually uses a walker to walk and a fall at home after losing his balance.  Did not hit his head.  Fell onto his left side.  Started hurting on his left hip and was brought to the ER.  Denies any chest pain shortness of breath fever chills palpitations.  ED Course: In the ER x-rays revealed left hip fracture on-call orthopedic surgeon Dr. Roda ShuttersXu was consulted and patient is being admitted for further management.  EKG shows normal sinus rhythm with LVH.  COVID-19 test was negative.  Labs reveal creatinine 1.3 hemoglobin 13.4 WBC 9.7 platelets 332 with UA showing possibility of UTI.  Review of Systems: As per HPI, rest all negative.   Past Medical History:  Diagnosis Date  . CKD (chronic kidney disease), stage II   . CVA (cerebral infarction)    Left thalamic, right-sided weakness wih aphagia, right leg brace  . Dyslipidemia   . Elevated PSA    biopsy negative in 2008  . GERD (gastroesophageal reflux disease)   . Hypertension    without medication since 2006  . Major depression   . Seasonal rhinitis     Past Surgical History:  Procedure Laterality Date  . NASAL SEPTUM SURGERY    . VASECTOMY       reports that he has never smoked. He has never used smokeless tobacco. He reports that he does not drink alcohol or use drugs.  No Known Allergies  Family History  Problem Relation Age of Onset  . Colon cancer Brother     Prior to Admission medications   Medication Sig Start Date End Date Taking? Authorizing Provider  amLODipine (NORVASC) 2.5 MG tablet Take 2.5 mg by mouth daily. 09/24/16  Yes [provider]  aspirin 81 MG tablet  Take 81 mg by mouth every other day. Sunday, Tuesday, Thursday and Saturday   Yes [provider]  atorvastatin (LIPITOR) 10 MG tablet Take 10 mg by mouth daily.  04/02/18  Yes [provider]  B Complex Vitamins (VITAMIN B COMPLEX PO) Take 1 tablet by mouth daily.    Yes [provider]  buPROPion (WELLBUTRIN XL) 150 MG 24 hr tablet TAKE 1 TABLET(150 MG) BY MOUTH DAILY 09/07/17  Yes Dohmeier, Porfirio Mylararmen, MD  Cholecalciferol (VITAMIN D3) 2000 units TABS Take 2,000 Units by mouth.   Yes [provider]  CRANBERRY EXTRACT PO Take 450 mg by mouth daily.   Yes [provider]  DULoxetine (CYMBALTA) 60 MG capsule Take 60 mg by mouth daily.   Yes [provider]  memantine (NAMENDA) 10 MG tablet Take 1 tablet (10 mg total) by mouth 2 (two) times daily. 05/03/19  Yes Dohmeier, Porfirio Mylararmen, MD  Misc Natural Products (OSTEO BI-FLEX TRIPLE STRENGTH PO) Take 1 tablet by mouth daily.   Yes [provider]    Physical Exam: Constitutional: Moderately built and nourished. Vitals:   06/19/19 2157 06/19/19 2230 06/20/19 0000 06/20/19 0100  BP: (!) 150/98 (!) 154/89 (!) 159/96 (!) 167/90  Pulse: 72 74 79 81  Resp: 16 (!) 22 (!) 21 19  Temp: 98.1 F (36.7 C)     TempSrc: Oral     SpO2: 97% 96%  97% 94%  Weight: 78 kg     Height: 5\' 8"  (1.727 m)      Eyes: Anicteric no pallor. ENMT: No discharge from the ears eyes nose or mouth. Neck: No mass felt.  No neck rigidity. Respiratory: No rhonchi or crepitations. Cardiovascular: S1-S2 heard. Abdomen: Soft nontender bowel sounds present. Musculoskeletal: Pain on moving left hip. Skin: No rash. Neurologic: Alert awake oriented to his name and place.  Right-sided hemiparesis from previous stroke. Psychiatric: Oriented to his name and place.   Labs on Admission: I have personally reviewed following labs and imaging studies  CBC: Recent Labs  Lab 06/19/19 2222  WBC 9.7  NEUTROABS 7.6  HGB 13.4  HCT  42.6  MCV 93.2  PLT 789   Basic Metabolic Panel: Recent Labs  Lab 06/19/19 2222  NA 141  K 4.1  CL 106  CO2 26  GLUCOSE 121*  BUN 18  CREATININE 1.36*  CALCIUM 9.4   GFR: Estimated Creatinine Clearance: 44.7 mL/min (A) (by C-G formula based on SCr of 1.36 mg/dL (H)). Liver Function Tests: No results for input(s): AST, ALT, ALKPHOS, BILITOT, PROT, ALBUMIN in the last 168 hours. No results for input(s): LIPASE, AMYLASE in the last 168 hours. No results for input(s): AMMONIA in the last 168 hours. Coagulation Profile: No results for input(s): INR, PROTIME in the last 168 hours. Cardiac Enzymes: No results for input(s): CKTOTAL, CKMB, CKMBINDEX, TROPONINI in the last 168 hours. BNP (last 3 results) No results for input(s): PROBNP in the last 8760 hours. HbA1C: No results for input(s): HGBA1C in the last 72 hours. CBG: No results for input(s): GLUCAP in the last 168 hours. Lipid Profile: No results for input(s): CHOL, HDL, LDLCALC, TRIG, CHOLHDL, LDLDIRECT in the last 72 hours. Thyroid Function Tests: No results for input(s): TSH, T4TOTAL, FREET4, T3FREE, THYROIDAB in the last 72 hours. Anemia Panel: No results for input(s): VITAMINB12, FOLATE, FERRITIN, TIBC, IRON, RETICCTPCT in the last 72 hours. Urine analysis: No results found for: COLORURINE, APPEARANCEUR, LABSPEC, PHURINE, GLUCOSEU, HGBUR, BILIRUBINUR, KETONESUR, PROTEINUR, UROBILINOGEN, NITRITE, LEUKOCYTESUR Sepsis Labs: @LABRCNTIP (procalcitonin:4,lacticidven:4) )No results found for this or any previous visit (from the past 240 hour(s)).   Radiological Exams on Admission: Dg Hip Unilat W Or W/o Pelvis Min 4 Views Left  Result Date: 06/19/2019 CLINICAL DATA:  Fall EXAM: DG HIP (WITH OR WITHOUT PELVIS) 4+V LEFT COMPARISON:  CT abdomen pelvis 08/20/2016 FINDINGS: There is an impacted and slightly externally rotated transcervical left femoral neck fracture with overlying soft tissue swelling. The left femoral head  remains normally located. Remaining bones of the pelvis are congruent. The proximal right femur is intact. Bowel gas pattern is unremarkable. IMPRESSION: Impacted and slightly externally rotated transcervical left femoral neck fracture with overlying soft tissue swelling. Electronically Signed   By: Lovena Le M.D.   On: 06/19/2019 23:10    EKG: Independently reviewed.  Normal sinus rhythm with LVH.  Assessment/Plan Principal Problem:   Closed left hip fracture, initial encounter [facility]) Active Problems:   Hemiplegia following CVA (cerebrovascular accident) ([place])   Depression due to dementia ([place])   OSA on CPAP   Hypertension    1. Left hip fracture status post mechanical fall -we will keep patient n.p.o. in anticipation of procedure.  Patient will be at moderate risk for intermediate risk procedure.  Further recommendations per orthopedics. 2. Hypertension on amlodipine. 3. History of depression on Cymbalta and Wellbutrin. 4. History of dementia on Namenda. 5. History of stroke with right-sided hemiparesis holding aspirin in  anticipation of procedure on statins. 6. Mildly elevated creatinine could be acute renal disease no old labs to compare.  Follow metabolic panel.  Given that patient has fracture of the left hip requiring surgery patient will need more than 2 midnight stay and inpatient status.   DVT prophylaxis: SCDs in anticipation of procedure. Code Status: DNR confirmed with patient's daughter. Family Communication: Patient's daughter. Disposition Plan: Probably rehab. Consults called: Orthopedics. Admission status: Inpatient.   Eduard Clos MD [facility] Pager [phone].  If 7PM-7AM, please contact night-coverage [email or web address] Password [email or web address]  06/20/2019, 1:51 AM

## 2019-06-20 NOTE — Op Note (Signed)
   Date of Surgery: 06/20/2019  INDICATIONS: Mr. Quinter is a 78 y.o.-year-old male who sustained a hip fracture; he was indicated for open reduction and internal fixation due to the displaced nature of the fracture and came to the operating room today for this procedure. The family did consent to the procedure after discussion of the risks and benefits.   PREOPERATIVE DIAGNOSIS: left femoral neck hip fracture  POSTOPERATIVE DIAGNOSIS: Same.  PROCEDURE: Open treatment of proximal end of femur, neck with internal fixation. CPT [phone].  SURGEON: N. Eduard Roux, M.D.  ASSIST: Ciro Backer [place], [place]; necessary for the timely completion of procedure and due to complexity of procedure.  ANESTHESIA: general  IV FLUIDS AND URINE: See anesthesia.  ESTIMATED BLOOD LOSS: minimal mL.  IMPLANTS: Smith [employer] Nephew  DRAINS: None.  COMPLICATIONS: see description of procedure.  DESCRIPTION OF PROCEDURE: The patient was brought to the operating room and placed supine on the operating table.  The patient had been signed prior to the procedure and this was documented. The patient had the anesthesia placed by the anesthesiologist.  The prep verification and incision time-outs were performed to confirm that this was the correct patient, site, side and location. The patient had SCDs in place. The patient did receive antibiotics prior to the incision and was redosed during the procedure as needed at indicated intervals.  The patient's well leg was placed in a flexed lithotomy position and carefully padded.  The operative leg was placed in traction and also well-padded.  Care was taken to confirm radiographs before prepping and draping. The hip was prepped and draped in the standard fashion.  Screws were placed using the following technique.  The 0.062" Kirschner wire was first placed and confirmed to be in the proper location on both AP and lateral views. After the guidewire was placed, the skin incision was  made over the guidewire, then the 4.5 mm cannulated drill was started over the wire.  The drill was used to drill the bony corridor, again confirming during drilling on both views. The final cannulated screw guidewire was then placed and again confirmed in position on both views.  The measuring stick was used to measure the length of screws.  This was repeated for all screws.  The approach-withdraw phenomenon was visualized under live fluoroscopy to confirm that all screws were of the appropriate length and in the head.  All wounds were copiously irrigated with saline and then the skin was reapproximated with staples. The wounds were cleaned and dried a final time and a sterile dressing. The patient was then transferred back to the bed and left the operating room in stable condition.  All sponge and instrument counts were correct.  POSTOPERATIVE PLAN: 25% partial weight bearing.  Mobilize with PT.

## 2019-06-20 NOTE — Anesthesia Procedure Notes (Signed)
Procedure Name: Intubation Date/Time: 06/20/2019 5:37 PM Performed by: Candis Shine, CRNA Pre-anesthesia Checklist: Patient identified, Emergency Drugs available, Suction available and Patient being monitored Patient Re-evaluated:Patient Re-evaluated prior to induction Oxygen Delivery Method: Circle System Utilized Preoxygenation: Pre-oxygenation with 100% oxygen Induction Type: IV induction Ventilation: Mask ventilation without difficulty Laryngoscope Size: Mac and 3 Grade View: Grade I Tube type: Oral Tube size: 7.5 mm Number of attempts: 1 Airway Equipment and Method: Stylet Placement Confirmation: ETT inserted through vocal cords under direct vision,  positive ETCO2 and breath sounds checked- equal and bilateral Secured at: 22 cm Tube secured with: Tape Dental Injury: Teeth and Oropharynx as per pre-operative assessment

## 2019-06-20 NOTE — Progress Notes (Signed)
PROGRESS NOTE    [name]  XNT:[id number] DOB: 06/26/1941 DOA: 06/19/2019 PCP: [name], MD    Brief Narrative:  78 year old male who presented with left hip pain.  He does have significant past medical history of dementia, hypertension, dyslipidemia, history of CVA) diaphoresis sustained mechanical fall at home.  After the fall he had significant left hip pain.  On his initial physical examination his blood pressure was 150/98, heart rate 72, respirate 22, temperature 98.1, oxygen saturation 98%, his lungs are clear to auscultation bilaterally, heart S1-S2 present rhythm, abdomen soft, no extremity edema.  Hip films show impacted and slightly externally rotated transcervical left femoral neck fracture with overlying soft tissue swelling.  Patient was admitted to the hospital working diagnosis of left hip fracture.   Assessment & Plan:   Principal Problem:   Closed left hip fracture, initial encounter [facility]) Active Problems:   Hemiplegia following CVA (cerebrovascular accident) (HCC)   Depression due to dementia (HCC)   OSA on CPAP   Hypertension  Patient has been taken to the operation room, will follow-up after he returns to the medical floor.  For now we will continue his antihypertensive agents, continue pain control and DVT prophylaxis.  Neurochecks pain protocol, continue memantine, bupropion and duloxetine.     Body mass index is 26.16 kg/m. Malnutrition Type:      Malnutrition Characteristics:      Nutrition Interventions:     RN Pressure Injury Documentation:    Objective: Vitals:   06/20/19 1400 06/20/19 1430 06/20/19 1500 06/20/19 1550  BP: (!) 161/91 (!) 166/79 (!) 162/86   Pulse: 89 91 89   Resp: 20 18 16    Temp:      TempSrc:      SpO2: 93% 95% 94%   Weight:    78 kg  Height:    5' 7.99" (1.727 m)   No intake or output data in the 24 hours ending 06/20/19 1614 Filed Weights   06/19/19 2157 06/20/19 1550  Weight: 78 kg 78 kg     Examination:       Data Reviewed: I have personally reviewed following labs and imaging studies  CBC: Recent Labs  Lab 06/19/19 2222  WBC 9.7  NEUTROABS 7.6  HGB 13.4  HCT 42.6  MCV 93.2  PLT 332   Basic Metabolic Panel: Recent Labs  Lab 06/19/19 2222  NA 141  K 4.1  CL 106  CO2 26  GLUCOSE 121*  BUN 18  CREATININE 1.36*  CALCIUM 9.4   GFR: Estimated Creatinine Clearance: 44.7 mL/min (A) (by C-G formula based on SCr of 1.36 mg/dL (H)). Liver Function Tests: No results for input(s): AST, ALT, ALKPHOS, BILITOT, PROT, ALBUMIN in the last 168 hours. No results for input(s): LIPASE, AMYLASE in the last 168 hours. No results for input(s): AMMONIA in the last 168 hours. Coagulation Profile: No results for input(s): INR, PROTIME in the last 168 hours. Cardiac Enzymes: No results for input(s): CKTOTAL, CKMB, CKMBINDEX, TROPONINI in the last 168 hours. BNP (last 3 results) No results for input(s): PROBNP in the last 8760 hours. HbA1C: No results for input(s): HGBA1C in the last 72 hours. CBG: Recent Labs  Lab 06/20/19 0724  GLUCAP 133*   Lipid Profile: No results for input(s): CHOL, HDL, LDLCALC, TRIG, CHOLHDL, LDLDIRECT in the last 72 hours. Thyroid Function Tests: No results for input(s): TSH, T4TOTAL, FREET4, T3FREE, THYROIDAB in the last 72 hours. Anemia Panel: No results for input(s): VITAMINB12, FOLATE, FERRITIN, TIBC, IRON, RETICCTPCT  in the last 72 hours.    Radiology Studies: I have reviewed all of the imaging during this hospital visit personally     Scheduled Meds: . [MAR Hold] amLODipine  2.5 mg Oral Daily  . [MAR Hold] atorvastatin  10 mg Oral Daily  . [MAR Hold] buPROPion  150 mg Oral Daily  . [MAR Hold] DULoxetine  60 mg Oral Daily  . [MAR Hold] memantine  10 mg Oral BID  . povidone-iodine  2 application Topical Once  . tranexamic acid (CYKLOKAPRON) topical -INTRAOP  2,000 mg Topical To OR   Continuous Infusions: . ceFAZolin    .  [START ON 06/21/2019]  ceFAZolin (ANCEF) IV    . lactated ringers    . tranexamic acid    . tranexamic acid       LOS: 0 days        Martine Bleecker Gerome Apley, MD

## 2019-06-20 NOTE — TOC Initial Note (Addendum)
Transition of Care [facility]) - Initial/Assessment Note    Patient Details  Name: [name] MRN: [id number] Date of Birth: 1941-06-24  Transition of Care [facility]) CM/SW Contact:    Andreas Newport Phone Number:[phone] 06/20/2019, 3:13 PM  Clinical Narrative:                 Andreas Newport RN CM, spoke with patient in the room. Patient  pleasantly confused yet oriented to situation. States he lives with his daughter and plans to discharge to skilled nursing facility for rehab. Patient states he has walker, grab bars and other assistive devices in the home. Patient requested that case manager call his daughter Ervin Knack to get more accurate information. Case manager called Ervin Knack on the telephone and introduced role. Daughter very appreciative of call and states she is hoping to get patient into [facility] for some rehab so he can get strong enough to return to her home. States member has had [facility] via [facility] in the past including PT, OT and ST. Patient continues to have trouble swallowing and daughter is concerned about supervision at the SNF during meals due to visitor restrictions from [place]. Case manager encouraged daughter to communicate concerns to staff and continue to advocate for patient.  No other needs at this time per daughter. Daughter gave permission to create [name] and fax out referrals to SNF.   Expected Discharge Plan: [place]     Patient Goals and CMS Choice Patient states their goals for this hospitalization and ongoing recovery are:: discharge to rehab to get back to baseline      Expected Discharge Plan and Services Expected Discharge Plan: [place] arrangements for the past 2 months: Single Family Home                                      Prior Living Arrangements/Services Living arrangements for the past 2 months: Single Family Home Lives with:: Adult Children Patient language and need for  interpreter reviewed:: Yes        Need for Family Participation in Patient Care: Yes (Comment)(History of Dementia-Dtr is primary caregiver) Care giver support system in place?: Yes (comment)(Lives with daughter and son in law) Current home services: DME Criminal Activity/Legal Involvement Pertinent to Current Situation/Hospitalization: No - Comment as needed  Activities of Daily Living Home Assistive Devices/Equipment: Cane (specify quad or straight), Eyeglasses, Walker (specify type), Shower chair without back(front wheeled walker, single point cane, walk-in shower) ADL Screening (condition at time of admission) Patient's cognitive ability adequate to safely complete daily activities?: Yes Is the patient deaf or have difficulty hearing?: No Does the patient have difficulty seeing, even when wearing glasses/contacts?: No Does the patient have difficulty concentrating, remembering, or making decisions?: Yes Patient able to express need for assistance with ADLs?: Yes Does the patient have difficulty dressing or bathing?: Yes Independently performs ADLs?: No Communication: Independent Dressing (OT): Needs assistance Is this a change from baseline?: Change from baseline, expected to last >3 days Grooming: Needs assistance Is this a change from baseline?: Change from baseline, expected to last >3 days Feeding: Needs assistance Is this a change from baseline?: Change from baseline, expected to last >3 days Bathing: Needs assistance Is this a change from baseline?: Change from baseline, expected to last >3 days Toileting: Dependent Is this a change from baseline?: Change  from baseline, expected to last >3days In/Out Bed: Dependent Is this a change from baseline?: Change from baseline, expected to last >3 days Walks in Home: Dependent Is this a change from baseline?: Change from baseline, expected to last >3 days Does the patient have difficulty walking or climbing stairs?: Yes(secondary to  weakness) Weakness of Legs: Left Weakness of Arms/Hands: None  Permission Sought/Granted Permission sought to share information with : Guardian, Case Manager, Family Supports, PCP Permission granted to share information with : Yes, Verbal Permission Granted  Share Information with NAME: Broadus John     Permission granted to share info w Relationship: Daughter     Emotional Assessment       Orientation: : Oriented to Self, Oriented to Place, Oriented to  Time, Oriented to Situation, Fluctuating Orientation (Suspected and/or reported Sundowners) Alcohol / Substance Use: Never Used Psych Involvement: No (comment)  Admission diagnosis:  Fall; Hip Pain Patient Active Problem List   Diagnosis Date Noted   Closed left hip fracture, initial encounter (HCC) 06/20/2019   Hypertension 06/20/2019   OSA on CPAP 07/05/2018   Memory disturbance 07/05/2018   Excessive daytime sleepiness 04/03/2018   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   Snoring 04/03/2018   Hemiplegia affecting dominant side, post-stroke (HCC) 03/07/2017   Spastic hemiplegia affecting dominant side (HCC) 02/07/2017   Hemiparesis affecting right side as late effect of cerebrovascular accident (HCC) 12/02/2016   Right foot drop 12/02/2016   Cognitive and neurobehavioral dysfunction 06/30/2015   Depression due to dementia (HCC) 01/28/2015   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 [place] [address] Phone: [phone] Fax: [phone]     Social  Determinants of Health (SDOH) Interventions    Readmission Risk Interventions No flowsheet data found.

## 2019-06-20 NOTE — Anesthesia Preprocedure Evaluation (Signed)
Anesthesia Evaluation  Patient identified by MRN, date of birth, ID band Patient awake    Reviewed: Allergy & Precautions, NPO status , Patient's Chart, lab work & pertinent test results  History of Anesthesia Complications Negative for: history of anesthetic complications  Airway Mallampati: II  TM Distance: >3 FB Neck ROM: Full    Dental   Pulmonary sleep apnea and Continuous Positive Airway Pressure Ventilation ,    Pulmonary exam normal        Cardiovascular hypertension, Normal cardiovascular exam     Neuro/Psych PSYCHIATRIC DISORDERS Depression Dementia CVA, Residual Symptoms    GI/Hepatic Neg liver ROS, GERD  ,  Endo/Other  negative endocrine ROS  Renal/GU Renal InsufficiencyRenal disease  negative genitourinary   Musculoskeletal negative musculoskeletal ROS (+)   Abdominal   Peds  Hematology negative hematology ROS (+)   Anesthesia Other Findings   Reproductive/Obstetrics                            Anesthesia Physical Anesthesia Plan  ASA: III  Anesthesia Plan: General   Post-op Pain Management:    Induction: Intravenous  PONV Risk Score and Plan: Ondansetron, Dexamethasone, Treatment may vary due to age or medical condition and Midazolam  Airway Management Planned: Oral ETT  Additional Equipment: None  Intra-op Plan:   Post-operative Plan: Extubation in OR  Informed Consent: I have reviewed the patients History and Physical, chart, labs and discussed the procedure including the risks, benefits and alternatives for the proposed anesthesia with the patient or authorized representative who has indicated his/her understanding and acceptance.   Patient has DNR.  Discussed DNR with power of attorney and Suspend DNR.   Consent reviewed with POA  Plan Discussed with:   Anesthesia Plan Comments:         Anesthesia Quick Evaluation

## 2019-06-20 NOTE — Transfer of Care (Signed)
Immediate Anesthesia Transfer of Care Note  Patient: [name]  Procedure(s) Performed: CANNULATED HIP PINNING (Left Hip)  Patient Location: PACU  Anesthesia Type:General  Level of Consciousness: drowsy  Airway & Oxygen Therapy: Patient Spontanous Breathing and Patient connected to face mask oxygen  Post-op Assessment: Report given to RN and Post -op Vital signs reviewed and stable  Post vital signs: Reviewed and stable  Last Vitals:  Vitals Value Taken Time  BP    Temp    Pulse    Resp    SpO2      Last Graham:  Vitals:   06/20/19 0630  TempSrc:   PainSc: 8          Complications: No apparent anesthesia complications

## 2019-06-21 ENCOUNTER — Other Ambulatory Visit: Payer: Self-pay

## 2019-06-21 ENCOUNTER — Encounter (HOSPITAL_COMMUNITY): Payer: Self-pay | Admitting: Orthopaedic Surgery

## 2019-06-21 LAB — GLUCOSE, CAPILLARY
Glucose-Capillary: 106 mg/dL — ABNORMAL HIGH (ref 70–99)
Glucose-Capillary: 109 mg/dL — ABNORMAL HIGH (ref 70–99)
Glucose-Capillary: 126 mg/dL — ABNORMAL HIGH (ref 70–99)
Glucose-Capillary: 131 mg/dL — ABNORMAL HIGH (ref 70–99)

## 2019-06-21 LAB — CBC
HCT: 38.8 % — ABNORMAL LOW (ref 39.0–52.0)
Hemoglobin: 12.6 g/dL — ABNORMAL LOW (ref 13.0–17.0)
MCH: 29.2 pg (ref 26.0–34.0)
MCHC: 32.5 g/dL (ref 30.0–36.0)
MCV: 90 fL (ref 80.0–100.0)
Platelets: 306 10*3/uL (ref 150–400)
RBC: 4.31 MIL/uL (ref 4.22–5.81)
RDW: 16.8 % — ABNORMAL HIGH (ref 11.5–15.5)
WBC: 11.9 10*3/uL — ABNORMAL HIGH (ref 4.0–10.5)
nRBC: 0 % (ref 0.0–0.2)

## 2019-06-21 LAB — BASIC METABOLIC PANEL
Anion gap: 11 (ref 5–15)
BUN: 15 mg/dL (ref 8–23)
CO2: 20 mmol/L — ABNORMAL LOW (ref 22–32)
Calcium: 8.9 mg/dL (ref 8.9–10.3)
Chloride: 108 mmol/L (ref 98–111)
Creatinine, Ser: 1.3 mg/dL — ABNORMAL HIGH (ref 0.61–1.24)
GFR calc Af Amer: >60 mL/min (ref >60)
GFR calc non Af Amer: 53 mL/min — ABNORMAL LOW (ref >60)
Glucose, Bld: 129 mg/dL — ABNORMAL HIGH (ref 70–99)
Potassium: 3.7 mmol/L (ref 3.5–5.1)
Sodium: 139 mmol/L (ref 135–145)

## 2019-06-21 MED ORDER — ENSURE ENLIVE PO LIQD
237.0000 mL | Freq: Two times a day (BID) | ORAL | Status: DC
  Administered 2019-06-21 – 2019-06-23 (×5): 237 mL via ORAL

## 2019-06-21 MED ORDER — ASPIRIN EC 81 MG PO TBEC
81.0000 mg | DELAYED_RELEASE_TABLET | ORAL | Status: DC
  Administered 2019-06-21 – 2019-06-23 (×2): 81 mg via ORAL
  Filled 2019-06-21 (×2): qty 1

## 2019-06-21 MED ORDER — HYDRALAZINE HCL 25 MG PO TABS: 25.0000 mg | ORAL_TABLET | Freq: Four times a day (QID) | ORAL | Status: DC | PRN

## 2019-06-21 NOTE — Plan of Care (Signed)
  Problem: Education: Goal: Knowledge of General Education information will improve Description: Including pain rating scale, medication(s)/side effects and non-pharmacologic comfort measures Outcome: Progressing   Problem: Health Behavior/Discharge Planning: Goal: Ability to manage health-related needs will improve Outcome: Progressing   Problem: Activity: Goal: Risk for activity intolerance will decrease Outcome: Progressing   Problem: Elimination: Goal: Will not experience complications related to bowel motility Outcome: Progressing   Problem: Pain Managment: Goal: General experience of comfort will improve Outcome: Progressing   Problem: Safety: Goal: Ability to remain free from injury will improve Outcome: Progressing   Problem: Skin Integrity: Goal: Risk for impaired skin integrity will decrease Outcome: Progressing   

## 2019-06-21 NOTE — TOC Progression Note (Signed)
Transition of Care [facility]) - Progression Note    Patient Details  Name: [name] MRN: [id number] Date of Birth: Oct 07, 1940  Transition of Care [facility]) CM/SW [place], LCSW Phone Number: 06/21/2019, 4:32 PM  Clinical Narrative:    CSW spoke with patient's daughter to make her aware that [facility] is unable to accept patient. She reports concern that SNF will not be able to check on patient enough and that he will not ask for help. [facility] is contacting daughter to address questions and start insurance authorization process.    Expected Discharge Plan: [place] Barriers to Discharge: Continued Medical Work up, Ship broker  Expected Discharge Plan and Services Expected Discharge Plan: [place] Choice: [place] arrangements for the past 2 months: Single Family Home                 DME Arranged: N/A DME Agency: NA       HH Arranged: NA           Social Determinants of Health (SDOH) Interventions    Readmission Risk Interventions No flowsheet data found.

## 2019-06-21 NOTE — Progress Notes (Signed)
Subjective: 1 Day Post-Op Procedure(s) (LRB): CANNULATED HIP PINNING (Left) Patient reports pain as mild.    Objective: Vital signs in last 24 hours: Temp:  [97 F (36.1 C)-98.3 F (36.8 C)] 98.1 F (36.7 C) (10/15 0755) Pulse Rate:  [50-94] 80 (10/15 0755) Resp:  [14-27] 15 (10/15 0755) BP: (141-172)/(79-97) 141/88 (10/15 0755) SpO2:  [91 %-96 %] 96 % (10/15 0755) Weight:  [78 kg] 78 kg (10/14 1550)  Intake/Output from previous day: 10/14 0701 - 10/15 0700 In: 1095.6 [I.V.:995.6; IV Piggyback:100] Out: 800 [Urine:775; Blood:25] Intake/Output this shift: No intake/output data recorded.  Recent Labs    06/19/19 2222 06/20/19 2307 06/21/19 0227  HGB 13.4 12.7* 12.6*   Recent Labs    06/20/19 2307 06/21/19 0227  WBC 12.7* 11.9*  RBC 4.23 4.31  HCT 38.6* 38.8*  PLT 298 306   Recent Labs    06/19/19 2222 06/20/19 2307 06/21/19 0227  NA 141  --  139  K 4.1  --  3.7  CL 106  --  108  CO2 26  --  20*  BUN 18  --  15  CREATININE 1.36* 1.46* 1.30*  GLUCOSE 121*  --  129*  CALCIUM 9.4  --  8.9   No results for input(s): LABPT, INR in the last 72 hours.  Neurologically intact Neurovascular intact Sensation intact distally Intact pulses distally Dorsiflexion/Plantar flexion intact Incision: dressing C/D/I No cellulitis present Compartment soft   Assessment/Plan: 1 Day Post-Op Procedure(s) (LRB): CANNULATED HIP PINNING (Left) Up with therapy 25% partial weight bearing LLE ABLA-mild and stable Lovenox 40mg  daily for dvt ppx F/u with Dr. Erlinda Hong 2 weeks post-op for suture removal       Aundra Dubin 06/21/2019, 8:04 AM

## 2019-06-21 NOTE — Progress Notes (Addendum)
PROGRESS NOTE    [name]  ZOX:[id number] DOB: Jun 25, 1941 DOA: 06/19/2019 PCP: [name], MD   Brief Narrative:  78 year old male who presented with left hip pain.  He does have significant past medical history of dementia, hypertension, dyslipidemia, history of CVA) who sustained mechanical fall at home.  After the fall he had significant left hip pain.  On his initial physical examination his blood pressure was 150/98, heart rate 72, respirate 22, temperature 98.1, oxygen saturation 98%, his lungs are clear to auscultation bilaterally, heart S1-S2 present rhythm, abdomen soft, no extremity edema.  Hip films show impacted and slightly externally rotated transcervical left femoral neck fracture with overlying soft tissue swelling.  Patient was admitted under hospitalist service and orthopedics were consulted he underwent hip repair with cannulated hip pinning on the left on 06/20/2019 with Dr. Roda Shutters.   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 Roda Shutters.  Management and pain control per orthopedics.  Essential hypertension: Mostly controlled with some elevation and diastolic pressure.  Continue Minott up in 2.5 mg.  Add PRN hydralazine.  History of depression: Cymbalta and Wellbutrin  History of dementia: Continue on Namenda.  History of stroke with right-sided hemiparesis: Resume aspirin today.  CKD stage III: His baseline creatinine seems to be around 1.3.  He is at his baseline.  Watch closely.  DVT prophylaxis: Lovenox Code Status: DNR Family Communication:  None present at bedside.  Plan of care discussed with patient in length and he verbalized understanding and agreed with  it. Disposition Plan: Potentially SNF.  PT OT to see.  Estimated body mass index is 26.16 kg/m as calculated from the following:   Height as of this encounter: 5' 7.99" (1.727 m).   Weight as of this encounter: 78 kg.      Nutritional status:               Consultants:   Orthopedics  Procedures:   Left hip surgical repair on 06/20/2019  Antimicrobials:   None   Subjective: Patient seen and examined.  He has no complaints.  Slightly sleepy and tired.  Alert and oriented otherwise.  Objective: Vitals:   06/20/19 2114 06/21/19 0044 06/21/19 0416 06/21/19 0755  BP: (!) 152/88 (!) 144/87 (!) 146/86 (!) 141/88  Pulse: 94 90 75 80  Resp: Temp:  98 F (36.7 C) 97.7 F (36.5 C) 98.1 F (36.7 C)  TempSrc:  Oral Oral Oral  SpO2: 91% 96% 94% 96%  Weight:      Height:        Intake/Output Summary (Last 24 hours) at 06/21/2019 1110 Last data filed at 06/21/2019 0300 Gross per 24 hour  Intake 1095.6 ml  Output 800 ml  Net 295.6 ml   Filed Weights   06/19/19 2157 06/20/19 1550  Weight: 78 kg 78 kg    Examination:  General exam: Appears calm and comfortable  Respiratory system: Clear to auscultation. Respiratory effort normal. Cardiovascular system: S1 & S2 heard, RRR. No JVD, murmurs, rubs, gallops or clicks. No pedal edema. Gastrointestinal system: Abdomen is nondistended, soft and nontender. No organomegaly or masses felt. Normal bowel sounds heard. Central nervous system: Alert  and oriented.  Right hemiparesis Skin: No rashes, lesions or ulcers Psychiatry: Judgement and insight appear normal. Mood & affect appropriate.    Data Reviewed: I have personally reviewed following labs and imaging studies  CBC: Recent Labs  Lab 06/19/19 2222 06/20/19 2307 06/21/19 0227  WBC 9.7 12.7* 11.9*  NEUTROABS 7.6  --   --   HGB 13.4 12.7* 12.6*  HCT 42.6 38.6* 38.8*  MCV 93.2 91.3 90.0  PLT 332 298 306   Basic Metabolic Panel: Recent Labs   Lab 06/19/19 2222 06/20/19 2307 06/21/19 0227  NA 141  --  139  K 4.1  --  3.7  CL 106  --  108  CO2 26  --  20*  GLUCOSE 121*  --  129*  BUN 18  --  15  CREATININE 1.36* 1.46* 1.30*  CALCIUM 9.4  --  8.9   GFR: Estimated Creatinine Clearance: 46.8 mL/min (A) (by C-G formula based on SCr of 1.3 mg/dL (H)). Liver Function Tests: No results for input(s): AST, ALT, ALKPHOS, BILITOT, PROT, ALBUMIN in the last 168 hours. No results for input(s): LIPASE, AMYLASE in the last 168 hours. No results for input(s): AMMONIA in the last 168 hours. Coagulation Profile: No results for input(s): INR, PROTIME in the last 168 hours. Cardiac Enzymes: No results for input(s): CKTOTAL, CKMB, CKMBINDEX, TROPONINI in the last 168 hours. BNP (last 3 results) No results for input(s): PROBNP in the last 8760 hours. HbA1C: No results for input(s): HGBA1C in the last 72 hours. CBG: Recent Labs  Lab 06/20/19 0724 06/20/19 2343 06/21/19 0553  GLUCAP 133* 212* 126*   Lipid Profile: No results for input(s): CHOL, HDL, LDLCALC, TRIG, CHOLHDL, LDLDIRECT in the last 72 hours. Thyroid Function Tests: No results for input(s): TSH, T4TOTAL, FREET4, T3FREE, THYROIDAB in the last 72 hours. Anemia Panel: No results for input(s): VITAMINB12, FOLATE, FERRITIN, TIBC, IRON, RETICCTPCT in the last 72 hours. Sepsis Labs: No results for input(s): PROCALCITON, LATICACIDVEN in the last 168 hours.  Recent Results (from the past 240 hour(s))  SARS CORONAVIRUS 2 (TAT 6-24 HRS) Nasopharyngeal Nasopharyngeal Swab     Status: None   Collection Time: 06/20/19 12:19 AM   Specimen: Nasopharyngeal Swab  Result Value Ref Range Status   SARS Coronavirus 2 NEGATIVE NEGATIVE Final    Comment: (NOTE) SARS-CoV-2 target nucleic acids are NOT DETECTED. The SARS-CoV-2 RNA is generally detectable in upper and lower respiratory specimens during the acute phase of infection. Negative results do not preclude SARS-CoV-2 infection, do  not rule out co-infections with other pathogens, and should not be used as the sole basis for treatment or other patient management decisions. Negative results must be combined with clinical observations, patient history, and epidemiological information. The expected result is Negative. Fact Sheet for Patients: HairSlick.nohttps://www.fda.gov/media/138098/download Fact Sheet for Healthcare Providers: quierodirigir.comhttps://www.fda.gov/media/138095/download This test is not yet approved or cleared by the [place] FDA and  has been authorized for detection and/or diagnosis of SARS-CoV-2 by FDA under an Emergency Use Authorization (EUA). This EUA will remain  in effect (meaning this test can be used) for the duration of the COVID-19 declaration under Section 56 4(b)(1) of the Act, 21 U.S.C. section 360bbb-3(b)(1), unless the authorization is terminated or revoked sooner. Performed at [facility], [address] [place], [place] [address]   Culture, Urine     Status: Abnormal (Preliminary result)   Collection Time: 06/20/19  1:27 AM   Specimen: Urine, Clean Catch  Result Value Ref Range  Status   Specimen Description   Final    URINE, CLEAN CATCH Performed at [facility], [place] [address] [place], [place] [address]    Special Requests   Final    NONE Performed at [facility], [place] [address] [place], [place] [address]    Culture 30,000 COLONIES/mL PSEUDOMONAS AERUGINOSA (A)  Final   Report Status PENDING  Incomplete      Radiology Studies: Pelvis Portable  Result Date: 06/20/2019 CLINICAL DATA:  Left femoral neck fracture status post internal fixation EXAM: PORTABLE PELVIS 1-2 VIEWS COMPARISON:  Left hip radiographs dated 06/19/2019 FINDINGS: Three fixation screws are seen traversing the patient's left femoral neck fracture. The hardware is intact in the bones are well aligned. There is no hip joint dislocation. IMPRESSION: Intact fixation screws in the  left femoral neck fracture. No evidence of hardware complication. Electronically Signed   By: Zerita Boers M.D.   On: 06/20/2019 20:13   Chest Portable 1 View  Result Date: 06/20/2019 CLINICAL DATA:  78 year old male with hip fracture. Preoperative study. EXAM: PORTABLE CHEST 1 VIEW COMPARISON:  Portable chest 04/01/2004. FINDINGS: Portable AP semi upright view at 0204 hours. Lung volumes are within normal limits. Tortuous thoracic aorta. Other mediastinal contours are within normal limits. Visualized tracheal air column is within normal limits. Allowing for portable technique the lungs are clear. No acute osseous abnormality identified. IMPRESSION: No acute cardiopulmonary abnormality. Electronically Signed   By: Genevie Ann M.D.   On: 06/20/2019 03:37   Dg C-arm 1-60 Min  Result Date: 06/20/2019 CLINICAL DATA:  Intraoperative images from femoral neck fixation EXAM: DG C-ARM 1-60 MIN CONTRAST:  None FLUOROSCOPY TIME:  Fluoroscopy Time:  Not applicable Radiation Exposure Index (if provided by the fluoroscopic device): Not applicable Number of Acquired Spot Images: 0 COMPARISON:  Left hip radiographs dated 06/19/2019 FINDINGS: Intraoperative images demonstrate 3 fixation screws traversing the patient's left femoral neck fracture. The humeral head is within the acetabulum. There is no evidence of hardware failure. IMPRESSION: Intraoperative imaging during left femoral neck fracture fixation. Electronically Signed   By: Zerita Boers M.D.   On: 06/20/2019 20:11   Dg Hip Operative Unilat W Or W/o Pelvis Left  Result Date: 06/20/2019 CLINICAL DATA:  Left femoral neck fracture EXAM: OPERATIVE LEFT HIP (WITH PELVIS IF PERFORMED) 2 VIEWS TECHNIQUE: Fluoroscopic spot image(s) were submitted for interpretation post-operatively. COMPARISON:  Left hip radiographs dated 06/19/2019 FINDINGS: Intraoperative images demonstrate 3 fixation screws traversing the patient's left femoral neck fracture. The humeral head is  within the acetabulum. There is no evidence of hardware failure. IMPRESSION: Intraoperative imaging during left femoral neck fracture fixation. Electronically Signed   By: Zerita Boers M.D.   On: 06/20/2019 20:12   Dg Hip Unilat W Or W/o Pelvis Min 4 Views Left  Result Date: 06/19/2019 CLINICAL DATA:  Fall EXAM: DG HIP (WITH OR WITHOUT PELVIS) 4+V LEFT COMPARISON:  CT abdomen pelvis 08/20/2016 FINDINGS: There is an impacted and slightly externally rotated transcervical left femoral neck fracture with overlying soft tissue swelling. The left femoral head remains normally located. Remaining bones of the pelvis are congruent. The proximal right femur is intact. Bowel gas pattern is unremarkable. IMPRESSION: Impacted and slightly externally rotated transcervical left femoral neck fracture with overlying soft tissue swelling. Electronically Signed   By: Lovena Le M.D.   On: 06/19/2019 23:10    Scheduled Meds:  acetaminophen  500 mg Oral Q6H   amLODipine  2.5 mg Oral Daily  atorvastatin  10 mg Oral Daily   buPROPion  150 mg Oral Daily   docusate sodium  100 mg Oral BID   DULoxetine  60 mg Oral Daily   [START ON 06/22/2019] enoxaparin (LOVENOX) injection  40 mg Subcutaneous Q24H   memantine  10 mg Oral BID   Continuous Infusions:  sodium chloride 75 mL/hr at 06/20/19 2233    ceFAZolin (ANCEF) IV 2 g (06/21/19 0600)   lactated ringers 10 mL/hr at 06/20/19 2119   methocarbamol (ROBAXIN) IV       LOS: 1 day   Time spent: 29 minutes   Hughie Closs, MD [facility]  06/21/2019, 11:10 AM   To contact the attending provider between 7A-7P or the covering provider during after hours 7P-7A, please log into the web site [email or web address] and use password [email or web address].

## 2019-06-21 NOTE — Progress Notes (Signed)
Rehab Admissions Coordinator Note:  Patient was screened by Michel Santee for appropriateness for an Inpatient Acute Rehab Consult.  Pt does not currently meet the criteria for medical necessity for admission to CIR.  At this time, we are recommending [place].  Michel Santee 06/21/2019, 1:39 PM  I can be reached at [phone].

## 2019-06-21 NOTE — Anesthesia Postprocedure Evaluation (Signed)
Anesthesia Post Note  Patient: [name]  Procedure(s) Performed: CANNULATED HIP PINNING (Left Hip)     Patient location during evaluation: PACU Anesthesia Type: General Level of consciousness: awake and alert Pain management: pain level controlled Vital Signs Assessment: post-procedure vital signs reviewed and stable Respiratory status: spontaneous breathing, respiratory function stable, patient connected to nasal cannula oxygen and nonlabored ventilation Cardiovascular status: blood pressure returned to baseline and stable Postop Assessment: no apparent nausea or vomiting Anesthetic complications: no    Last Vitals:  Vitals:   06/21/19 0416 06/21/19 0755  BP: (!) 146/86 (!) 141/88  Pulse: 75 80  Resp: 18 15  Temp: 36.5 C 36.7 C  SpO2: 94% 96%    Last Pain:  Vitals:   06/21/19 0755  TempSrc: Oral  PainSc:                  Catalina Gravel

## 2019-06-21 NOTE — Progress Notes (Signed)
Initial Nutrition Assessment  DOCUMENTATION CODES:   Not applicable  INTERVENTION:   - Ensure Enlive po BID, each supplement provides 350 kcal and 20 grams of protein  NUTRITION DIAGNOSIS:   Increased nutrient needs related to post-op healing as evidenced by estimated needs.  GOAL:   Patient will meet greater than or equal to 90% of their needs  MONITOR:   PO intake, Supplement acceptance, Labs, Weight trends, Skin  REASON FOR ASSESSMENT:   Consult Hip fracture protocol  ASSESSMENT:   78 year old male who presented to the ED on 10/13 after a fall. PMH CVA, CKD stage II, GERD, HTN, dementia. Pt found to have left femoral neck fracture.   10/14 - s/p cannulated hip pinning  Noted plan for pt to d/c to SNF.  No meal completion documented.  Spoke with pt at bedside. Pt finishing eating a pear at time of visit. Noted 100% completed lunch tray on countertop.  Pt states that he has a good appetite and that he had eggs for breakfast. Pt trying to remember what else he had for breakfast but unable to recall.  Pt states that he lives with his daughter and her husband and that he typically eats 2 meals daily (breakfast and supper) with a snack in between.  Reviewed weight history in chart. Weight trending up over the last several years. When asked about his weight, pt reports it has been stable. Pt denies any recent weight loss.  Pt states that he ambulates at home with a walker but doesn't get around very well. Pt endorses plan to d/c to SNF for rehab.  RD encouraged PO intake and the importance of adequate kcal and protein in post-op healing. Pt states that he does not normally consume oral nutrition supplement but is willing to do so. He would prefer strawberry or chocolate flavor. RD to order.  Medications reviewed and include: Colace IVF: NS @ 75 ml/hr  Labs reviewed. CBG's: 106-212 x 24 hours  UOP: 775 ml x 24 hours  NUTRITION - FOCUSED PHYSICAL EXAM:    Most  Recent Value  Orbital Region  Mild depletion  Upper Arm Region  No depletion  Thoracic and Lumbar Region  No depletion  Buccal Region  No depletion  Temple Region  Mild depletion  Clavicle Bone Region  Mild depletion  Clavicle and Acromion Bone Region  Mild depletion  Scapular Bone Region  No depletion  Dorsal Hand  No depletion  Patellar Region  No depletion  Anterior Thigh Region  No depletion  Posterior Calf Region  Mild depletion  Edema (RD Assessment)  None  Hair  Reviewed  Eyes  Reviewed  Mouth  Reviewed  Skin  Reviewed  Nails  Reviewed       Diet Order:   Diet Order            Diet regular Room service appropriate? Yes; Fluid consistency: Thin  Diet effective now              EDUCATION NEEDS:   No education needs have been identified at this time  Skin:  Skin Assessment: Skin Integrity Issues: Skin Integrity Issues: Incisions: closed incision left hip  Last BM:  06/19/19  Height:   Ht Readings from Last 1 Encounters:  06/20/19 5' 7.99" (1.727 m)    Weight:   Wt Readings from Last 1 Encounters:  06/20/19 78 kg    Ideal Body Weight:  70 kg  BMI:  Body mass index is 26.16 kg/m.  Estimated  Nutritional Needs:   Kcal:  1850-2050  Protein:  85-100 grams  Fluid:  1.8-2.0 L    Gaynell Face, MS, RD, LDN Inpatient Clinical Dietitian Pager: [phone] Weekend/After Hours: [phone]

## 2019-06-21 NOTE — Progress Notes (Signed)
Physical Therapy Evaluation  Clinical Impression: Pt presented supine in bed with HOB elevated, awake and willing to participate in therapy session. Prior to admission, pt reported that he ambulated with use of a RW and was independent with ADLs. Pt lives with his daughter and son-in-law in a single level home with one step to enter. He will have 24/7 supervision/assistance upon d/c if needed. At the time of evaluation, pt limited secondary to increasing pain with standing and transfers. He required min A x2 to stand from EOB and to pivot towards his R side to recliner chair. Pt is very motivated and eager to make progress towards his PLOF. Pt's daughter entering room at end of session and eager to facilitate pt's progression as well. Pt would greatly benefit from further intensive therapy services at CIR to maximize his independence with functional mobility prior to returning home with family support. PT will continue to follow acutely as per PT POC.    06/21/19 1225  PT Visit Information  Last PT Received On 06/21/19  Assistance Needed +1  PT/OT/SLP Co-Evaluation/Treatment Yes  Reason for Co-Treatment For patient/therapist safety;To address functional/ADL transfers  PT goals addressed during session Mobility/safety with mobility;Balance;Proper use of DME;Strengthening/ROM  History of Present Illness Pt is a 78 y/o male admitted after falling at home and sustaining a Left subcapital femoral neck fracture. Pt is now s/p ORIF L hip fx. PMH including but not limited to dementia, CVA and CKD.  Precautions  Precautions Fall  Restrictions  Weight Bearing Restrictions Yes  LLE Weight Bearing PWB  LLE Partial Weight Bearing Percentage or Pounds 25%  Home Living  Family/patient expects to be discharged to: Private residence  Living Arrangements Children  Available Help at Discharge Family;Available 24 hours/day  Type of Home House  Home Access Stairs to enter  Entrance Stairs-Number of Steps 1   Home Layout One level  Bathroom Shower/Tub Walk-in IT trainer - 2 wheels;Shower seat  Additional Comments had CVA 17 years ago  Prior Function  Level of Independence Independent with assistive device(s)  Gait / Transfers Assistance Needed used RW  ADL's / Homemaking Assistance Needed was independent with ADLs/selfcare  Communication  Communication No difficulties  Pain Assessment  Pain Assessment Faces  Faces Pain Scale 6  Pain Location L hip  Pain Descriptors / Indicators Sore  Pain Intervention(s) Monitored during session;Repositioned  Cognition  Arousal/Alertness Awake/alert  Behavior During Therapy WFL for tasks assessed/performed  Overall Cognitive Status Impaired/Different from baseline  Area of Impairment Problem solving  Problem Solving Slow processing;Requires verbal cues  Upper Extremity Assessment  Upper Extremity Assessment Defer to OT evaluation;Overall [facility] for tasks assessed  Lower Extremity Assessment  Lower Extremity Assessment RLE deficits/detail;LLE deficits/detail  RLE Deficits / Details residual weakness from previous stroke  LLE Deficits / Details pt with decreased strength and ROM limitations secondary to post-op pain and weakness. Pt reporting sensation is WNL.  Bed Mobility  Overal bed mobility Needs Assistance  Bed Mobility Supine to Sit  Supine to sit Min guard  General bed mobility comments increased time and effort needed, pt achieving sitting position towards his L side  Transfers  Overall transfer level Needs assistance  Equipment used Rolling walker (2 wheeled)  Transfers Sit to/from [employer] Transfers  Sit to Stand Min assist;+2 physical assistance;+2 safety/equipment  Stand pivot transfers Min assist;+2 physical assistance;+2 safety/equipment  General transfer comment cueing for safe hand placement and technique; cueing once standing to adhere  to PWB (25%) on L LE; pt limited once standing  to only pivoting to recliner chair due to increasing pain  Balance  Overall balance assessment Needs assistance  Sitting-balance support Feet supported  Sitting balance-Leahy Scale Good  Standing balance support Bilateral upper extremity supported;Single extremity supported  Standing balance-Leahy Scale Poor  PT - End of Session  Equipment Utilized During Treatment Gait belt  Activity Tolerance Patient limited by pain  Patient left in chair;with call bell/phone within reach;with chair alarm set;with family/visitor present  Nurse Communication Mobility status  PT Assessment  PT Recommendation/Assessment Patient needs continued PT services  PT Visit Diagnosis Other abnormalities of gait and mobility (R26.89);Pain  Pain - Right/Left Left  Pain - part of body Hip  PT Problem List Decreased strength;Decreased range of motion;Decreased activity tolerance;Decreased balance;Decreased mobility;Decreased coordination;Decreased cognition;Decreased knowledge of use of DME;Decreased safety awareness;Decreased knowledge of precautions;Pain  PT Plan  PT Frequency (ACUTE ONLY) Min 5X/week  PT Treatment/Interventions (ACUTE ONLY) DME instruction;Gait training;Stair training;Therapeutic activities;Functional mobility training;Therapeutic exercise;Balance training;Neuromuscular re-education;Patient/family education  AM-PAC PT "6 Clicks" Mobility Outcome Measure (Version 2)  Help needed turning from your back to your side while in a flat bed without using bedrails? 3  Help needed moving from lying on your back to sitting on the side of a flat bed without using bedrails? 3  Help needed moving to and from a bed to a chair (including a wheelchair)? 2  Help needed standing up from a chair using your arms (e.g., wheelchair or bedside chair)? 3  Help needed to walk in hospital room? 2  Help needed climbing 3-5 steps with a railing?  1  6 Click Score 14  Consider Recommendation of Discharge To: CIR/SNF/LTACH  PT  Recommendation  Recommendations for Other Services Rehab consult  Follow Up Recommendations CIR  PT equipment None recommended by PT  Individuals Consulted  Consulted and Agree with Results and Recommendations Patient;Family member/caregiver  Family Member Consulted daughter  Acute Rehab PT Goals  Patient Stated Goal to return to PLOF  PT Goal Formulation With patient/family  Time For Goal Achievement 07/05/19  Potential to Achieve Goals Good  PT Time Calculation  PT Start Time (ACUTE ONLY) 1030  PT Stop Time (ACUTE ONLY) 1106  PT Time Calculation (min) (ACUTE ONLY) 36 min  PT General Charges  $$ ACUTE PT VISIT 1 Visit  PT Evaluation  $PT Eval Moderate Complexity 1 Mod  Written Expression  Dominant Hand Left   Sherie Don, PT, DPT  Acute Rehabilitation Services Pager [phone] Office [phone]

## 2019-06-21 NOTE — [place] FL2 (Signed)
[place] MEDICAID FL2 LEVEL OF CARE SCREENING TOOL     IDENTIFICATION  Patient Name: [name] Birthdate: 08-01-1941 Sex: male Admission Date (Current Location): 06/19/2019  [facility] and [place] Number:  Herbalist and Address:  [facility] [place]. [facility], [place] [address], [place], [place] [address]      Provider Number: [id number]  Attending Physician Name and Address:  Darliss Cheney, MD  Relative Name and Phone Number:  Coralyn Helling, daughter, [phone]    Current Level of Care: Hospital Recommended Level of Care: [place] Prior Approval Number:    Date Approved/Denied:   PASRR Number: [id number]  Discharge Plan: SNF    Current Diagnoses: Patient Active Problem List   Diagnosis Date Noted  . Closed left hip fracture, initial encounter ([place]) 06/20/2019  . Hypertension 06/20/2019  . Closed fracture of neck of left femur ([place])   . OSA on CPAP 07/05/2018  . Memory disturbance 07/05/2018  . Excessive daytime sleepiness 04/03/2018  . 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 Snoring 04/03/2018  . Hemiplegia affecting dominant side, post-stroke ([place]) 03/07/2017  . Spastic hemiplegia affecting dominant side ([place]) 02/07/2017  . Hemiparesis affecting right side as late effect of cerebrovascular accident ([place]) 12/02/2016  . Right foot drop 12/02/2016  . 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 Hemiplegia following CVA (cerebrovascular accident) ([place]) 05/31/2014  . 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 MCI (mild cognitive impairment) with memory loss 05/31/2014    Orientation RESPIRATION BLADDER Height & Weight     Self, Situation, Place  Normal Continent Weight: 172 lb (78 kg) Height:   5' 7.99" (172.7 cm)  BEHAVIORAL SYMPTOMS/MOOD NEUROLOGICAL BOWEL NUTRITION STATUS      Continent Diet(Please see DC Summary)  AMBULATORY STATUS COMMUNICATION OF NEEDS Skin   Limited Assist Verbally Surgical wounds(Closed incision on hip)                       Personal Care Assistance Level of Assistance  Bathing, Feeding, Dressing Bathing Assistance: Maximum assistance Feeding assistance: Limited assistance Dressing Assistance: Limited assistance     Functional Limitations Info  Sight, Hearing, Speech Sight Info: Adequate Hearing Info: Adequate Speech Info: Adequate    SPECIAL CARE FACTORS FREQUENCY  PT (By licensed PT), OT (By licensed OT)     PT Frequency: 5x OT Frequency: 5x            Contractures Contractures Info: Not present    Additional Factors Info  Code Status, Allergies, Psychotropic Code Status Info: DNR Allergies Info: NKA Psychotropic Info: Wellbutrin; Cymbalta         Current Medications (06/21/2019):  This is the current hospital active medication list Current Facility-Administered Medications  Medication Dose Route Frequency Provider Last Rate Last Dose  . 0.9 %  sodium chloride infusion   Intravenous Continuous Leandrew Koyanagi, MD 75 mL/hr at 06/20/19 2233    . acetaminophen (TYLENOL) tablet 325-650 mg  325-650 mg Oral Q6H PRN Leandrew Koyanagi, MD      . acetaminophen (TYLENOL) tablet 500 mg  500 mg Oral Q6H Leandrew Koyanagi, MD   500 mg at 06/21/19 1138  . alum & mag hydroxide-simeth (MAALOX/MYLANTA) 200-200-20 MG/5ML suspension 30 mL  30 mL Oral Q4H PRN  Tarry Kos, MD      . amLODipine [facility]) tablet 2.5 mg  2.5 mg Oral Daily Tarry Kos, MD   2.5 mg at 06/21/19 0934  . aspirin EC tablet 81 mg  81 mg Oral [place] Dura, MD   81 mg at 06/21/19 1148  . atorvastatin (LIPITOR) tablet 10 mg  10 mg Oral Daily Tarry Kos, MD   10 mg at 06/21/19 0934  . buPROPion (WELLBUTRIN XL) 24 hr tablet 150 mg  150 mg Oral Daily Tarry Kos, MD    150 mg at 06/21/19 0934  . docusate sodium (COLACE) capsule 100 mg  100 mg Oral BID Tarry Kos, MD   100 mg at 06/21/19 0934  . DULoxetine (CYMBALTA) DR capsule 60 mg  60 mg Oral Daily Tarry Kos, MD   60 mg at 06/21/19 0934  . [START ON 06/22/2019] enoxaparin (LOVENOX) injection 40 mg  40 mg Subcutaneous Q24H Tarry Kos, MD      . hydrALAZINE (APRESOLINE) tablet 25 mg  25 mg Oral Q6H PRN Hughie Closs, MD      . HYDROcodone-acetaminophen (NORCO) 7.5-325 MG per tablet 1-2 tablet  1-2 tablet Oral Q4H PRN Tarry Kos, MD      . HYDROcodone-acetaminophen (NORCO/VICODIN) 5-325 MG per tablet 1-2 tablet  1-2 tablet Oral Q4H PRN Tarry Kos, MD      . lactated ringers infusion   Intravenous Continuous Tarry Kos, MD 10 mL/hr at 06/20/19 2119    . magnesium citrate solution 1 Bottle  1 Bottle Oral Once PRN Tarry Kos, MD      . memantine [facility]) tablet 10 mg  10 mg Oral BID Tarry Kos, MD   10 mg at 06/21/19 0934  . menthol-cetylpyridinium (CEPACOL) lozenge 3 mg  1 lozenge Oral PRN Tarry Kos, MD       Or  . phenol (CHLORASEPTIC) mouth spray 1 spray  1 spray Mouth/Throat PRN Tarry Kos, MD      . methocarbamol (ROBAXIN) tablet 500 mg  500 mg Oral Q6H PRN Tarry Kos, MD       Or  . methocarbamol (ROBAXIN) 500 mg in dextrose 5 % 50 mL IVPB  500 mg Intravenous Q6H PRN Tarry Kos, MD      . morphine 2 MG/ML injection 0.5 mg  0.5 mg Intravenous Q2H PRN Tarry Kos, MD   0.5 mg at 06/20/19 1306  . morphine 2 MG/ML injection 1 mg  1 mg Intravenous Q2H PRN Tarry Kos, MD      . ondansetron [facility]) tablet 4 mg  4 mg Oral Q6H PRN Tarry Kos, MD       Or  . ondansetron [facility]) injection 4 mg  4 mg Intravenous Q6H PRN Tarry Kos, MD      . polyethylene glycol (MIRALAX / GLYCOLAX) packet 17 g  17 g Oral Daily PRN Tarry Kos, MD      . sorbitol 70 % solution 30 mL  30 mL Oral Daily PRN Tarry Kos, MD         Discharge Medications: Please see discharge  summary for a list of discharge medications.  Relevant Imaging Results:  Relevant Lab Results:   Additional Information SSN: [id number]       COVID negative on 06/20/19  Mearl Latin, LCSW

## 2019-06-21 NOTE — TOC Progression Note (Addendum)
CTransition of Care [facility]) - Progression Note    Patient Details  Name: Keshawn Fiorito MRN: [id number] Date of Birth: 1941-05-28  Transition of Care [facility]) CM/SW Catherine, LCSW Phone Number: 06/21/2019, 11:05 AM  Clinical Narrative:    11am-CSW contacted patient's daughter to confirm discharge plan to SNF; voicemail full, CSW sent hippa compliant text. CSW awaiting therapy recommendations.   12pm-CSW spoke with patient and daughter at bedside. Daughter reported that after speaking with PT, they are interested in CIR placement. CSW described assessment process and will await official CIR consult if MD feels appropriate. Patient's daughter has selected [facility] in case CIR does not pan out. CSW also clarified visiting policy per daughter request.    Expected Discharge Plan: Skilled Nursing Facility Barriers to Discharge: Continued Medical Work up, Ship broker  Expected Discharge Plan and Services Expected Discharge Plan: [place] Choice: [place] arrangements for the past 2 months: Single Family Home                 DME Arranged: N/A DME Agency: NA       HH Arranged: NA           Social Determinants of Health (SDOH) Interventions    Readmission Risk Interventions No flowsheet data found.

## 2019-06-21 NOTE — Evaluation (Signed)
Occupational Therapy Evaluation Patient Details Name: [name] MRN: [id number] DOB: 01/06/1941 Today's Date: 06/21/2019    History of Present Illness Pt is a 78 y/o male admitted after falling at home and sustaining a Left subcapital femoral neck fracture. Pt is now s/p ORIF L hip fx. PMH including but not limited to dementia.   Clinical Impression   Pt with decline in function and safety with ADLs and ADL mobility with impaired strength, balance and endurance. Pt with hx of CVA 17 years ago with R side weakness and visual impairments. Pt reports that PTA, he lived with his daughter and son in law and that he was independent with ADLs/selfcare and used a RW for mobility. Pt currently requires min guard A with UB ADLs, mod - max A with LB ADLs, max A with toileting and min A + 2 with mobility using RW. Pt currently requires higher level of rehab to safely go home once d/c from acute stay and will have good 24/7 support at home when able to return home. Pt would benefit from acute OT services to address impairments to maximize level of function and safety    Follow Up Recommendations  CIR (will need ST SNF is unable to go to CIR)   Equipment Recommendations  None recommended by OT    Recommendations for Other Services       Precautions / Restrictions Precautions Precautions: Fall Restrictions Weight Bearing Restrictions: Yes LLE Weight Bearing: Partial weight bearing LLE Partial Weight Bearing Percentage or Pounds: 25%      Mobility Bed Mobility Overal bed mobility: Needs Assistance Bed Mobility: Supine to Sit     Supine to sit: Min guard     General bed mobility comments: increased time and effort  Transfers Overall transfer level: Needs assistance Equipment used: Rolling walker (2 wheeled) Transfers: Sit to/from [employer] Sit to Stand: Min assist;+2 physical assistance Stand pivot transfers: Min assist;+2 physical assistance       General  transfer comment: pt with increased pain and unable to mobilize further once standing at EOB before SPT to recliner    Balance Overall balance assessment: Needs assistance Sitting-balance support: Feet unsupported Sitting balance-Leahy Scale: Good     Standing balance support: Single extremity supported;Bilateral upper extremity supported;During functional activity Standing balance-Leahy Scale: Poor                             ADL either performed or assessed with clinical judgement   ADL Overall ADL's : Needs assistance/impaired Eating/Feeding: Independent;Sitting   Grooming: Wash/dry hands;Wash/dry face;Min guard;Sitting   Upper Body Bathing: Min guard;Sitting   Lower Body Bathing: Moderate assistance;Sitting/lateral leans   Upper Body Dressing : Min guard;Sitting   Lower Body Dressing: Maximal assistance   Toilet Transfer: Minimal assistance;+2 for safety/equipment;Cueing for safety;Cueing for sequencing;RW;Stand-pivot   Toileting- Clothing Manipulation and Hygiene: Maximal assistance;Sit to/from stand       Functional mobility during ADLs: Minimal assistance;+2 for physical assistance;Cueing for safety;Cueing for sequencing;Rolling walker General ADL Comments: pt unable to ambulate in room due to increased pain in L LE     Vision Baseline Vision/History: Wears glasses(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, difficulty looking up and down pe rpt) Wears Glasses: At all times Patient Visual Report: Other (comment)(hx difficulty looking up and down per pt report)       Perception     Praxis      Pertinent Vitals/Pain Pain Assessment:  Faces Faces Pain Scale: Hurts even more Pain Location: L LE during mobility Pain Descriptors / Indicators: Grimacing;Guarding;Sore;Aching Pain Intervention(s): Limited activity within patient's tolerance;Monitored during session;Repositioned     Hand Dominance Left   Extremity/Trunk Assessment Upper Extremity  Assessment Upper Extremity Assessment: Generalized weakness   Lower Extremity Assessment Lower Extremity Assessment: Defer to PT evaluation       Communication Communication Communication: No difficulties;Other (comment)(speech slightly impaired (old CVA))   Cognition Arousal/Alertness: Awake/alert Behavior During Therapy: WFL for tasks assessed/performed Overall Cognitive Status: Within Functional Limits for tasks assessed                                     General Comments       Exercises     Shoulder Instructions      Home Living Family/patient expects to be discharged to:: Private residence Living Arrangements: Children Available Help at Discharge: Family;Available 24 hours/day Type of Home: House Home Access: Stairs to enter [employer] of Steps: 1   Home Layout: One level     Bathroom Shower/Tub: Occupational psychologist: Standard     Home Equipment: Environmental consultant - 2 wheels;Shower seat   Additional Comments: had CVA 17 years ago. Moved in with daughter and son in law about 3 years ago      Prior Functioning/Environment Level of Independence: Independent with assistive device(s)  Gait / Transfers Assistance Needed: used RW ADL's / Homemaking Assistance Needed: was independent with ADLs/selfcare            OT Problem List: Decreased strength;Impaired balance (sitting and/or standing);Pain;Decreased activity tolerance;Impaired vision/perception;Decreased knowledge of use of DME or AE      OT Treatment/Interventions: Self-care/ADL training;DME and/or AE instruction;Therapeutic activities;Patient/family education    OT Goals(Current goals can be found in the care plan section) Acute Rehab OT Goals Patient Stated Goal: go home OT Goal Formulation: With patient/family Time For Goal Achievement: 07/05/19 Potential to Achieve Goals: Good ADL Goals Pt Will Perform Grooming: with set-up;with supervision;sitting Pt Will Perform  Upper Body Bathing: with supervision;with set-up;sitting Pt Will Perform Lower Body Bathing: with min assist;sitting/lateral leans;sit to/from stand Pt Will Perform Upper Body Dressing: with supervision;with set-up;sitting Pt Will Transfer to Toilet: with min assist;with min guard assist;ambulating Additional ADL Goal #1: Pt will complete bed mobiliyt with sup to sit EOB for ADLs/selfcare and in prep for transfers  OT Frequency: Min 2X/week   Barriers to D/C:    no barriers, will have 24/7 support once home       Co-evaluation PT/OT/SLP Co-Evaluation/Treatment: Yes Reason for Co-Treatment: For patient/therapist safety;To address functional/ADL transfers PT goals addressed during session: Mobility/safety with mobility;Balance;Proper use of DME;Strengthening/ROM OT goals addressed during session: ADL's and self-care;Proper use of Adaptive equipment and DME      AM-PAC OT "6 Clicks" Daily Activity     Outcome Measure Help from another person eating meals?: None Help from another person taking care of personal grooming?: A Little Help from another person toileting, which includes using toliet, bedpan, or urinal?: A Lot Help from another person bathing (including washing, rinsing, drying)?: A Lot Help from another person to put on and taking off regular upper body clothing?: A Little Help from another person to put on and taking off regular lower body clothing?: A Lot 6 Click Score: 16   End of Session Equipment Utilized During Treatment: Gait belt;Rolling walker  Activity Tolerance: Patient limited  by pain Patient left: in chair;with call bell/phone within reach;with chair alarm set;with family/visitor present  OT Visit Diagnosis: Unsteadiness on feet (R26.81);Other abnormalities of gait and mobility (R26.89);Muscle weakness (generalized) (M62.81);History of falling (Z91.81);Pain;Low vision, both eyes (H54.2) Pain - Right/Left: Left Pain - part of body: Hip                Time:  [id number] OT Time Calculation (min): 34 min Charges:  OT General Charges $OT Visit: 1 Visit OT Evaluation $OT Eval Moderate Complexity: 1 Mod    Galen ManilaSpencer, Randel Hargens Jeanette 06/21/2019, 12:37 PM

## 2019-06-21 NOTE — Plan of Care (Signed)
  Problem: Nutrition: Goal: Adequate nutrition will be maintained Outcome: Progressing   Problem: Coping: Goal: Level of anxiety will decrease Outcome: Progressing   Problem: Elimination: Goal: Will not experience complications related to urinary retention Outcome: Progressing   Problem: Safety: Goal: Ability to remain free from injury will improve Outcome: Progressing   Problem: Skin Integrity: Goal: Risk for impaired skin integrity will decrease Outcome: Progressing   

## 2019-06-22 ENCOUNTER — Telehealth: Payer: Self-pay | Admitting: Neurology

## 2019-06-22 ENCOUNTER — Inpatient Hospital Stay (HOSPITAL_COMMUNITY): Payer: Medicare Other

## 2019-06-22 LAB — CBC
HCT: 33.9 % — ABNORMAL LOW (ref 39.0–52.0)
Hemoglobin: 10.7 g/dL — ABNORMAL LOW (ref 13.0–17.0)
MCH: 29 pg (ref 26.0–34.0)
MCHC: 31.6 g/dL (ref 30.0–36.0)
MCV: 91.9 fL (ref 80.0–100.0)
Platelets: 290 10*3/uL (ref 150–400)
RBC: 3.69 MIL/uL — ABNORMAL LOW (ref 4.22–5.81)
RDW: 17.1 % — ABNORMAL HIGH (ref 11.5–15.5)
WBC: 12.8 10*3/uL — ABNORMAL HIGH (ref 4.0–10.5)
nRBC: 0 % (ref 0.0–0.2)

## 2019-06-22 LAB — BASIC METABOLIC PANEL
Anion gap: 10 (ref 5–15)
BUN: 20 mg/dL (ref 8–23)
CO2: 25 mmol/L (ref 22–32)
Calcium: 8.9 mg/dL (ref 8.9–10.3)
Chloride: 107 mmol/L (ref 98–111)
Creatinine, Ser: 1.42 mg/dL — ABNORMAL HIGH (ref 0.61–1.24)
GFR calc Af Amer: 55 mL/min — ABNORMAL LOW (ref >60)
GFR calc non Af Amer: 48 mL/min — ABNORMAL LOW (ref >60)
Glucose, Bld: 122 mg/dL — ABNORMAL HIGH (ref 70–99)
Potassium: 4 mmol/L (ref 3.5–5.1)
Sodium: 142 mmol/L (ref 135–145)

## 2019-06-22 LAB — GLUCOSE, CAPILLARY
Glucose-Capillary: 130 mg/dL — ABNORMAL HIGH (ref 70–99)
Glucose-Capillary: 111 mg/dL — ABNORMAL HIGH (ref 70–99)
Glucose-Capillary: 95 mg/dL (ref 70–99)
Glucose-Capillary: 104 mg/dL — ABNORMAL HIGH (ref 70–99)

## 2019-06-22 LAB — URINE CULTURE: Culture: 30000 — AB

## 2019-06-22 NOTE — Progress Notes (Signed)
Subjective: 2 Days Post-Op Procedure(s) (LRB): CANNULATED HIP PINNING (Left) Patient reports pain as mild.    Objective: Vital signs in last 24 hours: Temp:  [98 F (36.7 C)-98.3 F (36.8 C)] 98 F (36.7 C) (10/16 0418) Pulse Rate:  [76-97] 85 (10/16 0418) Resp:  [15-18] 18 (10/16 0418) BP: (140-146)/(78-89) 140/78 (10/16 0418) SpO2:  [95 %-98 %] 95 % (10/16 0418)  Intake/Output from previous day: 10/15 0701 - 10/16 0700 In: 360 [P.O.:360] Out: 651 [Urine:651] Intake/Output this shift: No intake/output data recorded.  Recent Labs    06/19/19 2222 06/20/19 2307 06/21/19 0227 06/22/19 0258  HGB 13.4 12.7* 12.6* 10.7*   Recent Labs    06/21/19 0227 06/22/19 0258  WBC 11.9* 12.8*  RBC 4.31 3.69*  HCT 38.8* 33.9*  PLT 306 290   Recent Labs    06/21/19 0227 06/22/19 0258  NA 139 142  K 3.7 4.0  CL 108 107  CO2 20* 25  BUN 15 20  CREATININE 1.30* 1.42*  GLUCOSE 129* 122*  CALCIUM 8.9 8.9   No results for input(s): LABPT, INR in the last 72 hours.  Neurologically intact Neurovascular intact Sensation intact distally Intact pulses distally Dorsiflexion/Plantar flexion intact Incision: dressing C/D/I No cellulitis present Compartment soft   Assessment/Plan: 2 Days Post-Op Procedure(s) (LRB): CANNULATED HIP PINNING (Left) Up with therapy 25% partial weight bearing LLE ABLA-mild and stable Lovenox 40mg  daily for dvt ppx F/u with Dr. Erlinda Hong 2 weeks post-op for suture removal     Aundra Dubin 06/22/2019, 7:31 AM

## 2019-06-22 NOTE — Progress Notes (Signed)
Patient observed on the floor while attempting to walk to the bathroom. The pt stated he did not ask for assistance because he "did not want to bother anybody". The chair alarm was set, and the call bell was within reach.  The patient was found supine in front of the bathroom door.  The patient stated that he  Hit hit head and a small bruise was noted to his right. Neurochecks initiated. There is a new skin tear on his right arm, and the pt is not complaining of hip pain. Pt was transferred from the floor to the bed with the [facility] Total Lift  The MD was notified, and a STAT bilateral hip xray was ordered. The orthopedic PA was called and notified and a voicemail was left. The pt's daughter, Coralyn Helling, was called and notified of the situation, and the plan moving forward.  The pt is stable, alert and oriented x3. Will continue to monitor.

## 2019-06-22 NOTE — Progress Notes (Signed)
RN spoke with Sheliah Hatch self-identified as the patient's POA, daughter, and caregiver.   RN informed patient's daughter that her father is currently resting in bed.

## 2019-06-22 NOTE — Plan of Care (Signed)
  Problem: Pain Managment: Goal: General experience of comfort will improve Outcome: Progressing   

## 2019-06-22 NOTE — Progress Notes (Signed)
On call Orthopedic provider made aware of x-ray results.

## 2019-06-22 NOTE — Progress Notes (Signed)
PT Cancellation Note  Patient Details Name: [name] MRN: [id number] DOB: [birth year]   Cancelled Treatment:    Reason Eval/Treat Not Completed: Other (comment) Pt with three nurse techs present in room and reporting that pt had just been returned to bed after being found in the floor. Pt with skin tear noted to R UE, nurse techs present and addressing. PT will continue to follow pt acutely as available and appropriate.    [place] 06/22/2019, 3:44 PM

## 2019-06-22 NOTE — Discharge Summary (Addendum)
Physician Discharge Summary  [name] FAO:[id number] DOB: [birth year] DOA: 06/19/2019  PCP: Georgann HousekeeDwight AdamczakAdmit date: 06/19/2019 Discharge date: 06/23/2019  Admitted From: Home Disposition: SNF  Recommendations for Outpatient Follow-up:  1. Follow up with PCP in 1-2 weeks 2. Follow with orthopedics in 2 weeks 3. Please obtain BMP/CBC in one week 4. Please follow up on the following pending results:  Home Health: None Equipment/Devices: None  Discharge Condition: Stable CODE STATUS: DNR Diet recommendation: Cardiac  Subjective: Patient seen and examined.  He has no complaints.  Pain well controlled.  Brief/Interim Summary: 78 year old male who presented with left hip pain. He does have significant past medical history of dementia, hypertension, dyslipidemia, history of CVA) who sustained mechanical fall at home. After the fallhe had significant left hip pain. On his initial physical examination his blood pressure was 150/98, heart rate 72, respirate 22, temperature 98.1, oxygen saturation 98%, his lungs are clear to auscultation bilaterally, heart S1-S2 present rhythm, abdomen soft, no extremity edema. Hip films show impacted and slightly externally rotated transcervical left femoral neck fracture with overlying soft tissue swelling.  Patient was admitted under hospitalist service and orthopedics were consulted he underwent hip repair with cannulated hip pinning on the left on 06/20/2019 with Dr. Roda Shutters.  He did very well postoperatively.  Work with PT OT.  They recommended SNF which was arranged for him.  He is going to be discharged in stable condition.  Will follow with orthopedics in 2 weeks.  He has Lovenox for 2 weeks for DVT prophylaxis.  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   aspirin 81 MG tablet Take 81 mg by mouth every other day. Sunday, Tuesday, Thursday and Saturday   atorvastatin 10 MG tablet Commonly known as: LIPITOR Take 10 mg by mouth daily.   buPROPion 150 MG 24 hr tablet Commonly known as: WELLBUTRIN XL TAKE 1 TABLET(150 MG) BY MOUTH DAILY   CRANBERRY EXTRACT PO Take 450 mg by mouth daily.   DULoxetine 60 MG capsule Commonly known as: CYMBALTA Take 60 mg by mouth daily.   enoxaparin 40 MG/0.4ML injection Commonly known as: LOVENOX Inject 0.4 mLs (40 mg total) into the skin daily.   memantine 10 MG tablet Commonly known as: NAMENDA Take 1 tablet (10 mg total) by mouth 2 (two) times daily.   OSTEO BI-FLEX TRIPLE STRENGTH PO Take 1 tablet by mouth daily.   oxyCODONE-acetaminophen 5-325 MG tablet Commonly known as: Percocet Take 1-2 tablets by mouth every 8 (eight) hours as needed for severe pain.   VITAMIN B COMPLEX PO Take 1 tablet by mouth daily.   Vitamin D3 50 MCG (2000 UT) Tabs Take 2,000 Units by mouth.  Discharge Care Instructions  (From admission, onward)         Start     Ordered   06/20/19 0000  Partial weight bearing     06/20/19 1818          Contact information for follow-up providers    Leandrew Koyanagi, MD In 2 weeks.   Specialty: Orthopedic Surgery Why: For suture removal, For wound re-check Contact information: [place] [address] [phone]        Wenda Low, MD Follow up in 1 week(s).   Specialty: Internal Medicine Contact information: [address] [place] [address] [phone]            Contact information for after-discharge care    Destination    [facility] Preferred SNF .   Service: Skilled Nursing Contact information: [place] [phone]                 No Known Allergies  Consultations: Orthopedics   Procedures/Studies: Pelvis Portable  Result Date: 06/20/2019 CLINICAL DATA:  Left femoral neck fracture status post internal fixation EXAM: PORTABLE PELVIS 1-2 VIEWS COMPARISON:  Left hip radiographs dated 06/19/2019 FINDINGS: Three fixation screws are seen traversing the patient's left femoral neck fracture. The hardware is intact in the bones are well aligned. There is no hip joint dislocation. IMPRESSION: Intact fixation screws in the left femoral neck fracture. No evidence of hardware complication. Electronically Signed   By: Zerita Boers M.D.   On: 06/20/2019 20:13   Chest Portable 1 View  Result Date: 06/20/2019 CLINICAL DATA:  78 year old male with hip fracture. Preoperative study. EXAM: PORTABLE CHEST 1 VIEW COMPARISON:  Portable chest 04/01/2004. FINDINGS: Portable AP semi upright view at 0204 hours. Lung volumes are within normal limits. Tortuous thoracic aorta. Other mediastinal contours are within normal limits. Visualized tracheal air column is within normal limits. Allowing for portable technique the lungs are clear. No acute osseous abnormality identified. IMPRESSION: No acute cardiopulmonary abnormality. Electronically Signed   By: Genevie Ann M.D.   On: 06/20/2019 03:37   Dg C-arm 1-60 Min  Result Date: 06/20/2019 CLINICAL DATA:  Intraoperative images from femoral neck fixation EXAM: DG C-ARM 1-60 MIN CONTRAST:  None FLUOROSCOPY TIME:  Fluoroscopy Time:  Not applicable Radiation Exposure Index (if provided by  the fluoroscopic device): Not applicable Number of Acquired Spot Images: 0 COMPARISON:  Left hip radiographs dated 06/19/2019 FINDINGS: Intraoperative images demonstrate 3 fixation screws traversing the patient's left femoral neck fracture. The humeral head is within the acetabulum. There is no evidence of hardware failure. IMPRESSION: Intraoperative imaging during left femoral neck fracture fixation. Electronically Signed   By: Zerita Boers M.D.   On: 06/20/2019 20:11   Dg Hip Operative Unilat W Or W/o Pelvis Left  Result Date: 06/20/2019 CLINICAL DATA:  Left femoral neck fracture EXAM: OPERATIVE LEFT HIP (WITH PELVIS IF PERFORMED) 2 VIEWS TECHNIQUE: Fluoroscopic spot image(s) were submitted for interpretation post-operatively. COMPARISON:  Left hip radiographs dated 06/19/2019 FINDINGS: Intraoperative images demonstrate 3 fixation screws traversing the patient's left femoral neck fracture. The humeral head is within the acetabulum. There is no evidence of hardware failure. IMPRESSION: Intraoperative imaging during left femoral neck fracture fixation. Electronically Signed   By: Zerita Boers M.D.   On: 06/20/2019 20:12   Dg Hip Unilat W Or W/o Pelvis Min 4 Views Left  Result Date: 06/19/2019 CLINICAL DATA:  Fall EXAM: DG HIP (WITH  OR WITHOUT PELVIS) 4+V LEFT COMPARISON:  CT abdomen pelvis 08/20/2016 FINDINGS: There is an impacted and slightly externally rotated transcervical left femoral neck fracture with overlying soft tissue swelling. The left femoral head remains normally located. Remaining bones of the pelvis are congruent. The proximal right femur is intact. Bowel gas pattern is unremarkable. IMPRESSION: Impacted and slightly externally rotated transcervical left femoral neck fracture with overlying soft tissue swelling. Electronically Signed   By: Kreg Shropshire M.D.   On: 06/19/2019 23:10      Discharge Exam: Vitals:   06/22/19 0800 06/22/19 1439  BP: 134/81 (!) 163/83  Pulse: 75 90   Resp: 15 17  Temp: 98.1 F (36.7 C) 98 F (36.7 C)  SpO2: 98% 97%   Vitals:   06/21/19 2100 06/22/19 0418 06/22/19 0800 06/22/19 1439  BP: 140/80 140/78 134/81 (!) 163/83  Pulse: 97 85 75 90  Resp: 18 18 15 17   Temp: 98.2 F (36.8 C) 98 F (36.7 C) 98.1 F (36.7 C) 98 F (36.7 C)  TempSrc: Oral Oral Oral Oral  SpO2: 98% 95% 98% 97%  Weight:      Height:        General: Pt is alert, awake, not in acute distress Cardiovascular: RRR, S1/S2 +, no rubs, no gallops Respiratory: CTA bilaterally, no wheezing, no rhonchi Abdominal: Soft, NT, ND, bowel sounds + Extremities: no edema, no cyanosis    The results of significant diagnostics from this hospitalization (including imaging, microbiology, ancillary and laboratory) are listed below for reference.     Microbiology: Recent Results (from the past 240 hour(s))  SARS CORONAVIRUS 2 (TAT 6-24 HRS) Nasopharyngeal Nasopharyngeal Swab     Status: None   Collection Time: 06/20/19 12:19 AM   Specimen: Nasopharyngeal Swab  Result Value Ref Range Status   SARS Coronavirus 2 NEGATIVE NEGATIVE Final    Comment: (NOTE) SARS-CoV-2 target nucleic acids are NOT DETECTED. The SARS-CoV-2 RNA is generally detectable in upper and lower respiratory specimens during the acute phase of infection. Negative results do not preclude SARS-CoV-2 infection, do not rule out co-infections with other pathogens, and should not be used as the sole basis for treatment or other patient management decisions. Negative results must be combined with clinical observations, patient history, and epidemiological information. The expected result is Negative. Fact Sheet for Patients: 06/22/19 Fact Sheet for Healthcare Providers: HairSlick.no This test is not yet approved or cleared by the [place]ir[place]ir.com FDA and  has been authorized for detection and/or diagnosis of SARS-CoV-2 by FDA under an Emergency  Use Authorization (EUA). This EUA will remain  in effect (meaning this test can be used) for the duration of the COVID-19 declaration under Section 56 4(b)(1) of the Act, 21 U.S.C. section 360bbb-3(b)(1), unless the authorization is terminated or revoked sooner. Performed at [facility], [address] [place], [place]   Culture, Urine     Status: Abnormal   Collection Time: 06/20/19  1:27 AM   Specimen: Urine, Clean Catch  Result Value Ref Range Status   Specimen Description   Final    URINE, CLEAN CATCH Performed at [facility], [address], [place], [place]    Special Requests   Final    NONE Performed at [facility], [address] [place], [place]    Culture 30,000 COLONIES/mL PSEUDOMONAS AERUGINOSA (A)  Final   Report Status 06/22/2019 FINAL  Final   Organism ID, Bacteria PSEUDOMONAS AERUGINOSA (A)  Final      Susceptibility   Pseudomonas aeruginosa - MIC*    CEFTAZIDIME 4 SENSITIVE Sensitive     CIPROFLOXACIN <=0.25 SENSITIVE Sensitive     GENTAMICIN <=1 SENSITIVE Sensitive     IMIPENEM 2 SENSITIVE Sensitive     PIP/TAZO 8 SENSITIVE Sensitive     CEFEPIME 2 SENSITIVE Sensitive     * 30,000 COLONIES/mL PSEUDOMONAS AERUGINOSA     Labs: BNP (last 3 results) No results for input(s): BNP in the last 8760 hours. Basic Metabolic Panel: Recent Labs  Lab 06/19/19 2222 06/20/19 2307 06/21/19 0227 06/22/19 0258  NA 141  --  139 142  K 4.1  --  3.7 4.0  CL 106  --  108 107  CO2 26  --  20* 25  GLUCOSE 121*  --  129* 122*  BUN 18  --  15 20  CREATININE 1.36* 1.46* 1.30* 1.42*  CALCIUM 9.4  --  8.9 8.9   Liver Function Tests: No results for input(s): AST, ALT, ALKPHOS, BILITOT, PROT, ALBUMIN in the last 168 hours. No results for input(s): LIPASE, AMYLASE in the last 168 hours. No results for input(s): AMMONIA in the last 168 hours. CBC: Recent Labs  Lab 06/19/19 2222 06/20/19 2307  06/21/19 0227 06/22/19 0258  WBC 9.7 12.7* 11.9* 12.8*  NEUTROABS 7.6  --   --   --   HGB 13.4 12.7* 12.6* 10.7*  HCT 42.6 38.6* 38.8* 33.9*  MCV 93.2 91.3 90.0 91.9  PLT 332 298 306 290   Cardiac Enzymes: No results for input(s): CKTOTAL, CKMB, CKMBINDEX, TROPONINI in the last 168 hours. BNP: Invalid input(s): POCBNP CBG: Recent Labs  Lab 06/21/19 1736 06/21/19 2357 06/22/19 0416 06/22/19 0640 06/22/19 1138  GLUCAP 109* 131* 130* 111* 95   D-Dimer No results for input(s): DDIMER in the last 72 hours. Hgb A1c No results for input(s): HGBA1C in the last 72 hours. Lipid Profile No results for input(s): CHOL, HDL, LDLCALC, TRIG, CHOLHDL, LDLDIRECT in the last 72 hours. Thyroid function studies No results for input(s): TSH, T4TOTAL, T3FREE, THYROIDAB in the last 72 hours.  Invalid input(s): FREET3 Anemia work up No results for input(s): VITAMINB12, FOLATE, FERRITIN, TIBC, IRON, RETICCTPCT in the last 72 hours. Urinalysis    Component Value Date/Time   COLORURINE YELLOW 06/20/2019 0127   APPEARANCEUR HAZY (A) 06/20/2019 0127   LABSPEC 1.011 06/20/2019 0127   PHURINE 7.0 06/20/2019 0127   GLUCOSEU NEGATIVE 06/20/2019 0127   HGBUR NEGATIVE 06/20/2019 0127   BILIRUBINUR NEGATIVE 06/20/2019 0127   KETONESUR NEGATIVE 06/20/2019 0127   PROTEINUR NEGATIVE 06/20/2019 0127   NITRITE NEGATIVE 06/20/2019 0127   LEUKOCYTESUR MODERATE (A) 06/20/2019 0127   Sepsis Labs Invalid input(s): PROCALCITONIN,  WBC,  LACTICIDVEN Microbiology Recent Results (from the past 240 hour(s))  SARS CORONAVIRUS 2 (TAT 6-24 HRS) Nasopharyngeal Nasopharyngeal Swab     Status: None   Collection Time: 06/20/19 12:19 AM   Specimen: Nasopharyngeal Swab  Result Value Ref Range Status   SARS Coronavirus 2 NEGATIVE NEGATIVE Final    Comment: (NOTE) SARS-CoV-2 target nucleic acids are NOT DETECTED. The SARS-CoV-2 RNA is generally detectable in upper and lower respiratory specimens during the acute  phase of infection. Negative results do not preclude SARS-CoV-2 infection, do not rule out co-infections with other pathogens, and should not be used as the sole basis for treatment or other patient management decisions. Negative results must be combined with clinical observations, patient history, and epidemiological information. The expected result is  Negative. Fact Sheet for Patients: HairSlick.nohttps://www.fda.gov/media/138098/download Fact Sheet for Healthcare Providers: quierodirigir.comhttps://www.fda.gov/media/138095/download This test is not yet approved or cleared by the [place] FDA and  has been authorized for detection and/or diagnosis of SARS-CoV-2 by FDA under an Emergency Use Authorization (EUA). This EUA will remain  in effect (meaning this test can be used) for the duration of the COVID-19 declaration under Section 56 4(b)(1) of the Act, 21 U.S.C. section 360bbb-3(b)(1), unless the authorization is terminated or revoked sooner. Performed at [facility], [address] [place], [place] [address]   Culture, Urine     Status: Abnormal   Collection Time: 06/20/19  1:27 AM   Specimen: Urine, Clean Catch  Result Value Ref Range Status   Specimen Description   Final    URINE, CLEAN CATCH Performed at [facility], [address] [place], [place] [address]    Special Requests   Final    NONE Performed at [facility], [address] [place], [place] [address]    Culture 30,000 COLONIES/mL PSEUDOMONAS AERUGINOSA (A)  Final   Report Status 06/22/2019 FINAL  Final   Organism ID, Bacteria PSEUDOMONAS AERUGINOSA (A)  Final      Susceptibility   Pseudomonas aeruginosa - MIC*    CEFTAZIDIME 4 SENSITIVE Sensitive     CIPROFLOXACIN <=0.25 SENSITIVE Sensitive     GENTAMICIN <=1 SENSITIVE Sensitive     IMIPENEM 2 SENSITIVE Sensitive     PIP/TAZO 8 SENSITIVE Sensitive     CEFEPIME 2 SENSITIVE Sensitive     * 30,000 COLONIES/mL PSEUDOMONAS AERUGINOSA      Time coordinating discharge: Over 30 minutes  SIGNED:   Hughie Clossavi Sausha Raymond, MD  [facility] 06/22/2019, 2:49 PM  If 7PM-7AM, please contact night-coverage [email or web address] Password [email or web address]

## 2019-06-22 NOTE — Plan of Care (Signed)
  Problem: Education: Goal: Knowledge of General Education information will improve Description: Including pain rating scale, medication(s)/side effects and non-pharmacologic comfort measures Outcome: Adequate for Discharge   

## 2019-06-22 NOTE — Progress Notes (Signed)
Received call from patients daughter requesting for patient to stay overnight because she was not comfortable with patient discharging after he had the fall. MD was notified.

## 2019-06-22 NOTE — Plan of Care (Signed)

## 2019-06-22 NOTE — TOC Progression Note (Signed)
Transition of Care [facility]) - Progression Note    Patient Details  Name: [name] MRN: [id number] Date of Birth: October 05, 1940  Transition [facility]) CM/SW [place], LCSW Phone Number: 06/22/2019, 5:47 PM  Clinical Narrative:    5pm-CSW notified that patient will not discharge today. CSW made SNF aware.   3pm-[facility] has received insurance approval and completed paperwork with daughter. No updated COVID needed.   Expected Discharge Plan: [place] Barriers to Discharge: Continued Medical Work up, Ship broker  Expected Discharge Plan and Services Expected Discharge Plan: [place] Choice: [place] Living arrangements for the past 2 months: Single Family Home Expected Discharge Date: 06/22/19               DME Arranged: N/A DME Agency: NA       HH Arranged: NA           Social Determinants of Health (SDOH) Interventions    Readmission Risk Interventions No flowsheet data found.

## 2019-06-22 NOTE — Progress Notes (Signed)
Occupational Therapy Treatment Patient Details Name: [name] MRN: [id number] DOB: 13-May-1941 Today's Date: 06/22/2019    History of present illness Pt is a 78 y/o male admitted after falling at home and sustaining a Left subcapital femoral neck fracture. Pt is now s/p ORIF L hip fx. PMH including but not limited to dementia, CVA and CKD.   OT comments  Pt falling on the floor from recliner attempting to ambulate to the bathroom unassisted with chair alarm ringing on arrival. Pt able to verbal event and reports not calling for help to not bother busy staff. Pt comfortable and thanking staff for help at the end of session.    Follow Up Recommendations  CIR    Equipment Recommendations  None recommended by OT    Recommendations for Other Services      Precautions / Restrictions Precautions Precautions: Fall Restrictions Weight Bearing Restrictions: Yes LLE Weight Bearing: Partial weight bearing LLE Partial Weight Bearing Percentage or Pounds: 25%       Mobility Bed Mobility               General bed mobility comments: OOB in chair with chair alarm sounding - on arrival of staff  Transfers Overall transfer level: Needs assistance               General transfer comment: hoyer lift total off floor to bed. OT educating staff in the room not to stand patient due to weight bearing restrictions and striking head    Balance                                           ADL either performed or assessed with clinical judgement   ADL Overall ADL's : Needs assistance/impaired                                       General ADL Comments: pt found down on floor with PWB restrictions noted in chart. OT assisting staff with placement of hoyer lift to bed transfer. pt able to help with UE placement in hoyer and rollign R and L for pad placement. Pt reports striking head when fall. Noted to have urine and blood on the floor near patient      Vision       Perception     Praxis      Cognition Arousal/Alertness: Awake/alert Behavior During Therapy: WFL for tasks assessed/performed Overall Cognitive Status: History of cognitive impairments - at baseline                                 General Comments: pt laying on the floor and states "i was trying to the go the bathroom." RN asking why didnt you call us. Pt states "you were busy"        Exercises     Shoulder Instructions       General Comments pt stable in the bed at the end of transfer. Tech staff in room taking vitals. All additional care passed to unit staff at this time    Pertinent Vitals/ Pain       Pain Assessment: No/denies pain  Home Living  Prior Functioning/Environment              Frequency  Min 2X/week        Progress Toward Goals  OT Goals(current goals can now be found in the care plan section)  Progress towards OT goals: Progressing toward goals  Acute Rehab OT Goals Patient Stated Goal: go home OT Goal Formulation: With patient/family Time For Goal Achievement: 07/05/19 Potential to Achieve Goals: Good ADL Goals Pt Will Perform Grooming: with set-up;with supervision;sitting Pt Will Perform Upper Body Bathing: with supervision;with set-up;sitting Pt Will Perform Lower Body Bathing: with min assist;sitting/lateral leans;sit to/from stand Pt Will Perform Upper Body Dressing: with supervision;with set-up;sitting Pt Will Transfer to Toilet: with min assist;with min guard assist;ambulating Additional ADL Goal #1: Pt will complete bed mobiliyt with sup to sit EOB for ADLs/selfcare and in prep for transfers  Plan Discharge plan remains appropriate    Co-evaluation                 AM-PAC OT "6 Clicks" Daily Activity     Outcome Measure   Help from another person eating meals?: None Help from another person taking care of personal grooming?: A  Little Help from another person toileting, which includes using toliet, bedpan, or urinal?: A Lot Help from another person bathing (including washing, rinsing, drying)?: A Lot Help from another person to put on and taking off regular upper body clothing?: A Little Help from another person to put on and taking off regular lower body clothing?: A Lot 6 Click Score: 16    End of Session    OT Visit Diagnosis: Unsteadiness on feet (R26.81);Other abnormalities of gait and mobility (R26.89);Muscle weakness (generalized) (M62.81);History of falling (Z91.81);Pain;Low vision, both eyes (H54.2) Pain - Right/Left: Left Pain - part of body: Hip   Activity Tolerance Patient tolerated treatment well   Patient Left in bed;with call bell/phone within reach;Other (comment)(staff at bed side addressing patient so no alarm set now)   Nurse Communication Mobility status;Precautions;Weight bearing status        Time: 1425-1449 OT Time Calculation (min): 24 min  Charges: OT General Charges $OT Visit: 1 Visit OT Treatments $Therapeutic Activity: 23-37 mins   Jeri Modena, OTR/L  Acute Rehabilitation Services Pager: [phone] Office: [phone] .    Jeri Modena 06/22/2019, 2:50 PM

## 2019-06-22 NOTE — Telephone Encounter (Signed)
Pt's daughter called wanting to know if the pt can have a PT referral for the right and the left leg due to pt breaking his hip. Please advise.

## 2019-06-22 NOTE — Progress Notes (Signed)
When reassessing patient, patient's Rt ring finger noted to be swollen, able to move digit but verbalizes pain. No bruising noted. MD notified and placed order for xray.

## 2019-06-23 DIAGNOSIS — S63274A Dislocation of unspecified interphalangeal joint of right ring finger, initial encounter: Secondary | ICD-10-CM

## 2019-06-23 LAB — GLUCOSE, CAPILLARY
Glucose-Capillary: 115 mg/dL — ABNORMAL HIGH (ref 70–99)
Glucose-Capillary: 115 mg/dL — ABNORMAL HIGH (ref 70–99)
Glucose-Capillary: 89 mg/dL (ref 70–99)

## 2019-06-23 LAB — CBC
HCT: 35.4 % — ABNORMAL LOW (ref 39.0–52.0)
Hemoglobin: 11.1 g/dL — ABNORMAL LOW (ref 13.0–17.0)
MCH: 28.9 pg (ref 26.0–34.0)
MCHC: 31.4 g/dL (ref 30.0–36.0)
MCV: 92.2 fL (ref 80.0–100.0)
Platelets: 315 10*3/uL (ref 150–400)
RBC: 3.84 MIL/uL — ABNORMAL LOW (ref 4.22–5.81)
RDW: 17.2 % — ABNORMAL HIGH (ref 11.5–15.5)
WBC: 16.5 10*3/uL — ABNORMAL HIGH (ref 4.0–10.5)
nRBC: 0 % (ref 0.0–0.2)

## 2019-06-23 NOTE — TOC Transition Note (Signed)
Transition of Care [facility]) - CM/SW Discharge Note   Patient Details  Name: [name] MRN: [id number] Date of Birth: 1941/01/28  Transition of Care [facility]) CM/SW Contact:  Bary Castilla, LCSW Phone Number: [phone] 06/23/2019, 2:43 PM   Clinical Narrative:     Patient will DC to:? Anticipated DC date:? Family notified:? Transport by:   Per MD patient ready for DC to [facility]. RN, patient, patient's family, and facility notified of DC. Discharge Summary sent to facility. RN given number for report [phone] room# 3225       . DC packet on chart. Ambulance transport requested for patient.  CSW signing off.   [place], [place] [phone]  Final next level of care: Skilled Nursing Facility Barriers to Discharge: No Barriers Identified   Patient Goals and CMS Choice Patient states their goals for this hospitalization and ongoing recovery are:: discharge to rehab to get back to baseline CMS Medicare.gov Compare Post Acute Care list provided to:: Patient Represenative (must comment)(Daughter) Choice offered to / list presented to : [place] / [place]  Discharge Placement              Patient chooses bed at: [facility] Patient to be transferred to facility by: [place] Name of family member notified: Daughter, [name] Patient and family notified of of transfer: 06/23/19  Discharge Plan and Services     Post Acute Care Choice: [place]          DME Arranged: N/A DME Agency: NA       HH Arranged: NA          Social Determinants of Health (SDOH) Interventions     Readmission Risk Interventions No flowsheet data found.

## 2019-06-23 NOTE — Progress Notes (Signed)
Attempted to give report to [place] [facility].  I left a message with my number that a patient was coming and wanted to give report. Mariann Laster was the receptionist and sent me to the [phone] hall phone which went straight to voiecmail 3 times.

## 2019-06-23 NOTE — Plan of Care (Signed)

## 2019-06-23 NOTE — Progress Notes (Signed)
Patient was seen and examined this morning.  Son was present at bedside.  He complained of ring finger of the right hand.  Did not have any other complaints such as headache, abdominal pain, hip pain.  Apparently, patient was discharged yesterday on 06/22/2019 around 2:25 PM.  I received a call just few minutes after completing his discharge that patient had a fall.  I evaluated patient right after that.  This fall led to laceration on his right arm which was dressed by nursing staff.  He did complain of pain in ring finger of the right hand but at that point in time, his finger was not tender, swollen or any other positive finding on exam.  His discharge was canceled due to this and the fact that his daughter was not comfortable with him discharging yesterday which was completely reasonable.  I then received a message from his nurse around 6:30 PM stating that patient's right ring finger was swollen and tender to palpation.  A stat x-ray of the hand was done which showed displaced finger but no fracture.  Orthopedics were informed right at the time results were revealed last night.  Finally orthopedics came in and reduced his PIP joint dislocation and per Ortho note, he is doing fine and he needs to follow-up with them as an outpatient.  He already has follow-up appointment with Dr. Erlinda Hong for his hip surgery.  On exam: General exam: Appears calm and comfortable  Respiratory system: Clear to auscultation. Respiratory effort normal. Cardiovascular system: S1 & S2 heard, RRR. No JVD, murmurs, rubs, gallops or clicks. No pedal edema. Gastrointestinal system: Abdomen is nondistended, soft and nontender. No organomegaly or masses felt. Normal bowel sounds heard. Central nervous system: Alert and oriented.  Right hemiparesis, known Extremities: Swollen and tender right index finger. Skin: No rashes, lesions or ulcers.  Psychiatry: Judgement and insight appear poor. Mood & affect appropriate.   Assessment and  plan: Left hip fracture.  Status post surgical repair.  Continue physical therapy and Lovenox and follow with orthopedics in 2 weeks.  Dislocation of the right ring finger: Reduced by orthopedics today.  Doing well.  Patient at this point in time is a stable for discharge.  I have have placed his discharge orders.  I have also asked the nurse to touch base with case manager/social worker to arrange his transportation to SNF.  Please refer to my discharge summary from yesterday for details.

## 2019-06-23 NOTE — Discharge Instructions (Signed)
° ° °  1. Change dressings as needed 2. May shower but keep incisions covered and dry 3. Take lovenox to prevent blood clots 4. Take stool softeners as needed 5. Take pain meds as needed 6. Wear the splint on the right ring finger until ortho outpatient follow-up

## 2019-06-23 NOTE — Progress Notes (Signed)
Patient ID: [name], male   DOB: 03/29/1941, 78 y.o.   MRN: [id number] I was called to the bedside to evaluate the patient's right hand.  While he has been in the hospital he sustained a mechanical fall last evening.  X-rays of the hand were obtained and I was called about those today.  It shows a right hand ring finger PIP joint dislocation.  The patient is scheduled to go to skilled nursing today.  I did come by the bedside and examined his right hand.  His son is at the bedside as well.  I was able to show him the x-rays.  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  On examination he does have a dislocation of the right hand ring finger at the PIP joint.  The finger is otherwise well perfused.  I was able to easily perform a digital block of the right ring finger using 1% plain lidocaine.  He tolerated the block well.  I was then able to easily reduce the PIP joint dislocation.  The patient also felt better after this.  His finger remained well-perfused and I could flex and extended showing a congruent reduction.  We will place his right ring finger and a foam splint that can remain on the finger until his follow-up in our office as he follows up for his left hip fracture.  The splint can be removed for hygiene purposes.

## 2019-06-23 NOTE — Progress Notes (Signed)
Orthopedic Tech Progress Note Patient Details:  [name] [birth year] [id number]  Ortho Devices Type of Ortho Device: Finger splint Ortho Device/Splint Location: right Ortho Device/Splint Interventions: Application   Post Interventions Patient Tolerated: Well Instructions Provided: Care of device   [place] 06/23/2019, 2:05 PM

## 2019-06-25 NOTE — Telephone Encounter (Signed)
Called the patient's daughter back. Clarified the order she was wanting. The patient is getting therapy on the left leg and his right leg is the one that was affected from his stroke. She states that she is hoping she can get order for therapy to work on the right leg as well. Advised that with medicare guidelines they will require a office visit for it to be documented before they can justify for home health therapy to be completed. Advised them to discuss with the provider over his care at [facility] rehab area and maybe they can start the therapy with him there first. Advised that he has an apt scheduled for next Wed with Dr Dohmeier at 3:30 pm. Advised that if they can make arrangements for him to still keep that upcoming apt we can discuss further to allow for documentation so that insurance would cover [facility] PT at that point for him when he dc home.

## 2019-07-04 ENCOUNTER — Encounter: Payer: Self-pay | Admitting: Neurology

## 2019-07-04 ENCOUNTER — Other Ambulatory Visit: Payer: Self-pay

## 2019-07-04 ENCOUNTER — Ambulatory Visit: Payer: Medicare Other | Admitting: Neurology

## 2019-07-04 VITALS — BP 137/83 | HR 98 | Temp 97.9°F | Ht 70.0 in

## 2019-07-04 DIAGNOSIS — F0789 Other personality and behavioral disorders due to known physiological condition: Secondary | ICD-10-CM

## 2019-07-04 DIAGNOSIS — I7774 Dissection of vertebral artery: Secondary | ICD-10-CM | POA: Diagnosis not present

## 2019-07-04 DIAGNOSIS — I639 Cerebral infarction, unspecified: Secondary | ICD-10-CM

## 2019-07-04 DIAGNOSIS — I69359 Hemiplegia and hemiparesis following cerebral infarction affecting unspecified side: Secondary | ICD-10-CM | POA: Diagnosis not present

## 2019-07-04 DIAGNOSIS — I6350 Cerebral infarction due to unspecified occlusion or stenosis of unspecified cerebral artery: Secondary | ICD-10-CM

## 2019-07-04 DIAGNOSIS — S72002A Fracture of unspecified part of neck of left femur, initial encounter for closed fracture: Secondary | ICD-10-CM

## 2019-07-04 DIAGNOSIS — G4719 Other hypersomnia: Secondary | ICD-10-CM

## 2019-07-04 DIAGNOSIS — F09 Unspecified mental disorder due to known physiological condition: Secondary | ICD-10-CM

## 2019-07-04 DIAGNOSIS — G8110 Spastic hemiplegia affecting unspecified side: Secondary | ICD-10-CM

## 2019-07-04 NOTE — Patient Instructions (Signed)

## 2019-07-04 NOTE — Progress Notes (Signed)
Provider:  Melvyn Novas, MD    Referring Provider: [name], MD Primary Care Physician:  [name], MD  Chief Complaint  Patient presents with  . Follow-up    Stroke and sleep follow up room wth daughter [name] and her temp is 98.1 pt aat [facility]  facility pt is not wearing cipap machine. Katrina , RN    07-04-2019.  Mr Evett meets me today in a wheelchair. His daughter reports he was admitted to hospital 06-20-2019,  After a fall. He fractured his " good ' right leg and dislocated the hip.  He dislocated his right ring finger in the another fall, while in hospital- and the injury was not discovered until 24 hours later, also had a head injury and was scheduled to be transferred to [facility]. His daughter intervened so that he stayed 48 hours and was stabilized.  He is now happy at [facility] and his med list is shrinking, he is getting few visits , due to need of social distancing.     07-05-2018, RV with Mr. [name] Mobley and daughter.  Follow up on PSG, CPAP study and auto compliance. He underwent 2 sleep studies- one baseline on 04-18-2018, baseline polysomnography revealed mild sleep apnea at 8.7 events per hour.  REM sleep accentuation was not noted, he actually did not have apnea when not supine. He always sleeps supine- that brings his apnea hypopnea index to 30/h. He had a follow-up titration to positive airway pressure on 30 April 2018, and his apnea was significantly reduced even in supine sleep his AHI was 2.7, and he had 0.0 apneas at a final pressure of 8 cmH2O.  He is using an auto titration CPAP with a pressure window between 4 and 10 cmH2O was 2 cm EPR he was tried here with a nasal pillow mask but if this is uncomfortable he should be allowed to wear what ever gets in the best air seal.  His download over the last 30 days shows 23 out of 30 days of use of CPAP but only 17 days that he managed to use it for 4 hours or more.  Average use of time is just 4  hours 30 minutes now the pressure is as quoted above, the residual AHI is only 1.9 the 95th percentile pressure is 7.4 cmH2O he does not have Cheyne-Stokes respiration.  I would like for him to continue if he can also he has not yet found that this reduces his daytime sleepiness significantly.  It may be working better if he can use it 4 to 5 hours at night. He can manage to put his mask on by himself.   Good news , his Granddaughter [name] will get married next April, and the family is very happy for her.  Mr. Hakim has had some changes in LOC- awareness of himself. He recently sat in the living room and did not wear a shirt , his daughter asked him why and he was unaware of the lack of clothing. This was a single event, not repeated. His voice is hoarse and dysarthric- as usual- but he speaks fluently.  He remains excessively daytime sleepy at 18/ 24 Epworth Sleepiness Score.    Interval history from 03 April 2018, patent and daughter here today. I am again very happy to see that Mr. Pavao is walking without a cane, he is using a walker, he has been gaining mobility and range of motion, he appears much more alert and younger than  he looked years ago.  Dr. Read Drivers had followed him for possible Botox needs, he continues to live with his daughter Coralyn Helling and her husband.  He has even done well with the heat.  Will be performed today another Mini-Mental Status Examination and he scored 23 out of 30 points, did 26 out of 30 the last time. I adjusted his MMSE and excluded the writing and drawing. 23-27 points.  He wants to discuss the sleep study - he snores loudly and family hears him gasping. He is sleepier in daytime / and she reports having trouble to get him woken up in AM- he may stay in bed until 1 PM.     Interval history from 13 September 2017, Mr. Lux and his daughter Coralyn Helling I seen here today.  The patient is using his walker but had experienced a fall 2 days ago after not having had any falls in 3 or  4 months.  The fall happened in the bathroom while urinating, standing and not holding on to a grab bar. He has visited a senior center and enjoyed the visits. He is living with Coralyn Helling and her husband. He goes swimming ! He plays cards! He is active and looks well.  MOCA today after MMSE last visit was at a  very high score 26/30.   Interval history from 03/07/2017, Mr. Swan Zayed and his daughter Coralyn Helling are seen here today. The patient is no longer using a 3 pronged cane but uses a walker which allows him to walk straighter with better body position and symmetry and keeps him from falling. His mobility also has improved with  AFO- and he responded well to a nerve block Dr Read Drivers will also evaluate the shoulder for possible BoTox needs. He is not nearly as tense since using the walker and replacing the cane.  PT and OT continue .  In comparison to previous visits his blood pressure is improved, his weight has improved, his ability to walk and exercise tolerance has greatly improved. He is alert and no longer as sleepy. I will follow Dr Letta Pate observation.     HPI: Interval history from December 20, 2016. Mrs. Goertzen died in early 2016-11-07 and Mr. Mizuno has now moved in with his daughter Coralyn Helling. She reports that he is doing well with the changes in environment and he does look well groomed and well fed and alert. We performed today Mini-Mental Status Examination in which she scored 26 out of 30 points this has to be seen in context with his right hand dominant hemiparesis, he is still able to draw also with a tremor, he was able to draw clock face and right a sentence. He generated 7 words. I would therefore adjust his test to 27 out of 30 points. Coralyn Helling has noticed that her father snores horribly, and he reports feeling sleepy all the time. 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 I will order a sleep test for him. His right foot  needs a new AFT, and he developed some calluses. I will send him to a PT evaluation and take their recommendations to [place].    Moishy Laday is a 78 y.o. male  Is seen here after a prolonged time, I used to follow this stroke patient during his hospitalization 10 years ago. His visit today was arranged by his wife, his PCP is Dr. Lysle Rubens, Mr. Humble was referred for a  memory evaluation .Because of his history of a complicated CVA ,brain stem stroke syndromes and hemiparesis , a vascular effect has to be taken into account. Mr. Forget father had late onset dementia and personality changes and Mrs. Symonette sees some parallels in his behavior. He has become more aggressive, easier agitated, which was not part of his personality before this year. The couple celebreated their Maxie Barb Anniversary in 2014,  active in the [employer] in [place] . Mr. Leisinger performs household chores and the couple spends time with the grandchildren now 71 and 13.  He has become impulsive, and sometimes" downright mean ". He still uses a urinary cath. Has right hand weakness , leg stiffness, left sensory loss, right facial weakness -hold cane in the left hand. Dysphonia, aphasia.  Mr Denio is falling more often, his skin is scabbed. He fell rising while pulling up on his cane. His walker has a seat and is used outside.  His other cane will have 3 prongs. He sleeps a lot. His wife started peritoneal dialysis.  2 times a day. Needs to wear a diaper for bowel accident. Is very happy with Dr. Abel Presto.  She has a sleep apnea evaluation tonight. Zakai snores terribly, even before his brainstem stroke, dependent on supine position.   I have  the pleasure today on 06/02/2016 to see [name] again, today in the presence of his daughter [name]. Mr. Catena had become extremely daytime sleepy over the last couple of months while his wife's health has declined to the level where she could not safely ambulate for  longer distance, and she is probably no longer safe to drive. She failed peritoneal dialysis, became extremely dehydrated and was insufficiently dialyzed. She has not a switch to hemodialysis but still is not thriving. For this reason the novel take over the organization of home health care which she initiated already. And also the healthcare power of attorney.She has requested FMLA. In the meantime home health care has been of significant benefit for him. He is eating more regular meals, he is leaving the bed at a certain time each morning. He does not need help with dressing. He is walking with a cane. He is alert oriented and able to answer my questions. He is getting daily physical therapy by home health. He has improved his right hemi-spasticity, he is using her single-prong cane. He could raise today from a seated position without assistance of another person.   Review of Systems: Out of a complete 14 system review, the patient complains of only the following symptoms, and all other reviewed systems are negative. He has never been a reader, watches TV , sleeps a lot. June 2019-Epworth is 13/ 24. FSS at 36/ 63.  Aphasia, dysarthria, spastic hemiparesis. Right dominant side.  Mr. Portal has had some changes in LOC- awareness of himself. He recently sat in the living room and did not wear a shirt , his daughter asked him why and he was unaware of the lack of clothing. This was a single event, not repeated. His voice is hoarse and dysarthric- as usual- but he speaks fluently.  He remains excessively daytime sleepy at 18/ 24 Epworth Sleepiness Score on 07-05-2018 .     Social History   Socioeconomic History  . Marital status: Widowed    Spouse name: [name]  . Number of children: 2  . Years of education: 102  . Highest education level: Not on file  Occupational History  . Not on file  Social Needs  . Financial resource strain: Not on file  . Food insecurity    Worry: Not on file    Inability:  Not on file  . Transportation needs    Medical: Not on file    Non-medical: Not on file  Tobacco Use  . Smoking status: Never Smoker  . Smokeless tobacco: Never Used  Substance and Sexual Activity  . Alcohol use: No  . Drug use: No  . Sexual activity: Not on file  Lifestyle  . Physical activity    Days per week: Not on file    Minutes per session: Not on file  . Stress: Not on file  Relationships  . Social Musicianconnections    Talks on phone: Not on file    Gets together: Not on file    Attends religious service: Not on file    Active member of club or organization: Not on file    Attends meetings of clubs or organizations: Not on file    Relationship status: Not on file  . Intimate partner violence    Fear of current or ex partner: Not on file    Emotionally abused: Not on file    Physically abused: Not on file    Forced sexual activity: Not on file  Other Topics Concern  . Not on file  Social History Narrative   Patient is married [name]([name]) and lives at home with his wife.   Patient is disabled.    Patient has two adult children and two grandchildren.   Patient has a high school education.   Patient is left-handed.   Patient drinks one cup of coffee daily.             Family History  Problem Relation Age of Onset  . Colon cancer Brother     Past Medical History:  Diagnosis Date  . CKD (chronic kidney disease), stage II   . CVA (cerebral infarction)    Left thalamic, right-sided weakness wih aphagia, right leg brace  . Dyslipidemia   . Elevated PSA    biopsy negative in 2008  . GERD (gastroesophageal reflux disease)   . Hypertension    without medication since 2006  . Major depression   . Seasonal rhinitis     Past Surgical History:  Procedure Laterality Date  . HIP PINNING,CANNULATED Left 06/20/2019   Procedure: CANNULATED HIP PINNING;  Surgeon: Tarry KosXu, Naiping M, MD;  Location: [facility] OR;  Service: Orthopedics;  Laterality: Left;  . NASAL SEPTUM SURGERY    .  VASECTOMY      Current Outpatient Medications  Medication Sig Dispense Refill  . amLODipine (NORVASC) 2.5 MG tablet Take 2.5 mg by mouth daily.    Marland Kitchen. aspirin 81 MG tablet Take 81 mg by mouth every other day. Sunday, Tuesday, Thursday and Saturday    . atorvastatin (LIPITOR) 10 MG tablet Take 10 mg by mouth daily.     . B Complex Vitamins (VITAMIN B COMPLEX PO) Take 1 tablet by mouth daily.     Marland Kitchen. buPROPion (WELLBUTRIN XL) 150 MG 24 hr tablet TAKE 1 TABLET(150 MG) BY MOUTH DAILY 90 tablet 2  . Cholecalciferol (VITAMIN D3) 2000 units TABS Take 2,000 Units by mouth.    Tilman Neat. CRANBERRY EXTRACT PO Take 450 mg by mouth daily.    . DULoxetine (CYMBALTA) 60 MG capsule Take 60 mg by mouth daily.    Marland Kitchen. enoxaparin (LOVENOX) 40 MG/0.4ML injection Inject 0.4 mLs (40 mg total) into the skin daily. 0.4  mL 13  . memantine (NAMENDA) 10 MG tablet Take 1 tablet (10 mg total) by mouth 2 (two) times daily. 60 tablet 3  . Misc Natural Products (OSTEO BI-FLEX TRIPLE STRENGTH PO) Take 1 tablet by mouth daily.    Marland Kitchen oxyCODONE-acetaminophen (PERCOCET) 5-325 MG tablet Take 1-2 tablets by mouth every 8 (eight) hours as needed for severe pain. 30 tablet 0   No current facility-administered medications for this visit.     Allergies as of 07/04/2019  . (No Known Allergies)    Vitals: BP 137/83   Pulse 98   Temp 97.9 F (36.6 C)   Ht  (1.778 m)   BMI 24.68 kg/m  Last Weight:  Wt Readings from Last 1 Encounters:  06/20/19 172 lb (78 kg)   Last Height:   Ht Readings from Last 1 Encounters:  07/04/19  (1.778 m)   Montreal Cognitive Assessment  09/13/2017 05/31/2014  Visuospatial/ Executive (0/5) 2 3  Naming (0/3) 3 3  Attention: Read list of digits (0/2) 2 2  Attention: Read list of letters (0/1) 1 1  Attention: Serial 7 subtraction starting at 100 (0/3) 2 3  Language: Repeat phrase (0/2) 1 2  Language : Fluency (0/1) 0 1  Abstraction (0/2) 2 2  Delayed Recall (0/5) 1 0  Orientation (0/6) 5 4   Total 19 21   MMSE - Mini Mental State Exam 07/04/2019 07/05/2018 04/03/2018  Orientation to time Orientation to Place Registration Attention/ Calculation Recall 3 3 0  Language- name 2 objects Language- repeat Language- follow 3 step command Language- read & follow direction Write a sentence 0 0 1  Copy design 0 1 1  Total score Physical exam:  General: The patient is awake, alert and appears not in acute distress. The patient is well groomed. Head: Normocephalic, atraumatic. Neck is supple. Mallampati 2 neck circumference: 14.5  Cardiovascular:  Regular rate and rhythm , without  murmurs or carotid bruit, and without distended neck veins. Respiratory: Lungs are clear to auscultation. Skin:  Without evidence of edema, or rash.  Trunk: Neurologic exam : The patient is awake and alert, oriented to place and time.  MOCA 19/26, MMSE 22/26 points. The test has to be adjusted for him not being able to draw and write.  Mr. Panetta performed a more Mini-Mental Status Examination today he stopped the first 2 of the serial sevens. Accessed  again excluding the handwriting since Mr. Jakubiak  is impaired through his brainstem stroke, as is drawing , but he was able to place the hands into the clock face correctly. There is a normal attention span & concentration ability.  Speech dysarthria, dysphonia- Mood and affect are appropriate.  Cranial nerves: Pupils are unequal but briskly reactive to light. He has lost the right peripheral vision, stereo vision is impaired -  He cannot drive . Has no diplopia. The patient is able to direct his gaze left and right but not up or down. 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 Hearing to finger rub intact. Facial sensation intact to fine touch. Facial motor strength is symmetric and tongue and uvula move midline. Tongue protrusion into either cheek is  normal! Shoulder shrug is normal.  Gait and  station: in a wheelchair. Deep tendon reflexes: clonic-  brisk in the right .Babinski maneuver response is upgoing right .  Assessment:  He has done very well, he is social, is involved, laughs and loves to eat. He swims, he talks. 30 minute vist with more than 50% of the face to face time dedicated to discussion of physical abilities, stable cognition, Improved over last visit- He has had falls and now fractured his hip on his non spastic side( left side)  prevention, He needs OT, PT and Rehab at [facility]. .   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   OSA with Hypersomnia - sleepiness is not resolved on CPAP. But he is physicially not as active as he could be.   His gait is much declined he is in a wheelchair.   Cognitive impairment has now  progressed by history and observation. MMSE 22-26 - will not repeat in 6 months but perhaps every 12 month. .  Short term memory loss. memory impairment, constitutes dementia. He cannot be measured on MOCA due to vision and handwriting impairment. Mini-Mental Status Examination revealed today 26 out of 30 points . MOCA 19 out of 26.  Adjusted for disability. Daughter feels he is more forgetful.     Depression is improved. Less agitated and less angry on Wellbutrin. More energy. The geriatric depression score was endorsed at 2 points again  , down from 7 points which was very indicative of depression.    [place] of his pontine/ brain stem stroke from 2005 - including right hemiparesis, right foot drop.  Using AFT.   Plan:  Start using CPAP nightly and aim for 5 hours or more, use CPAP when you nap.   He lives with his daughter, Ruford Dudzinski :She got Power of attorney for medical and financial affairs. Rv in 6 month with MMSE-  if he has a declining trend will add Aricept to Namenda. Need to watch for bradycardia. Continue post hip repair -Rehab.  Bed alarm for home needed before he returns. Ask at [facility].  Urinary incontinence.      Porfirio Mylar Yumna Ebers MD   Cc Dr Donette Larry, [place]  07/04/2019

## 2019-07-16 ENCOUNTER — Telehealth: Payer: Self-pay | Admitting: Orthopaedic Surgery

## 2019-07-16 NOTE — Telephone Encounter (Signed)
Patient's daughter Coralyn Helling called asked when ([name]) will come to his home. [name] asked what is her father's weight bearing status. The number to contact [name] is  [phone]

## 2019-07-17 NOTE — Telephone Encounter (Signed)
WB or NWB?

## 2019-07-17 NOTE — Telephone Encounter (Signed)
25% PWB

## 2019-07-18 NOTE — Telephone Encounter (Signed)
Called and advised daughter

## 2019-07-19 ENCOUNTER — Inpatient Hospital Stay: Payer: Medicare Other | Admitting: Orthopaedic Surgery

## 2019-07-20 ENCOUNTER — Ambulatory Visit (INDEPENDENT_AMBULATORY_CARE_PROVIDER_SITE_OTHER): Payer: Medicare Other

## 2019-07-20 ENCOUNTER — Encounter: Payer: Self-pay | Admitting: Orthopaedic Surgery

## 2019-07-20 ENCOUNTER — Ambulatory Visit (INDEPENDENT_AMBULATORY_CARE_PROVIDER_SITE_OTHER): Payer: Medicare Other | Admitting: Physician Assistant

## 2019-07-20 ENCOUNTER — Other Ambulatory Visit: Payer: Self-pay

## 2019-07-20 VITALS — Ht 70.0 in | Wt 172.0 lb

## 2019-07-20 DIAGNOSIS — S72002D Fracture of unspecified part of neck of left femur, subsequent encounter for closed fracture with routine healing: Secondary | ICD-10-CM

## 2019-07-20 NOTE — Progress Notes (Signed)
Post-Op Visit Note   Patient: [name]           Date of Birth: 08-Sep-1940           MRN: [id number] Visit Date: 07/20/2019 PCP: Wenda Low, MD   Assessment & Plan:  Chief Complaint:  Chief Complaint  Patient presents with  . Left Hip - Routine Post Op    Left hip pinning DOS 06/20/2019   Visit Diagnoses:  1. Closed fracture of neck of left femur with routine healing, subsequent encounter     Plan: Patient is a pleasant 78 year old gentleman who presents to our clinic today with his daughter.  He is 30 days status post cannulated pinning left hip from a femoral neck fracture, date of surgery 06/20/2019.  He has been doing well.  He was at [facility] and has recently been discharged home with his daughter.  Occupational therapy has started but they are waiting on physical therapy to start hopefully next week.  Minimal pain.  Examination of his left hip reveals a fully healed surgical incision without complication.  Staples intact.  He is able to flex his hip.  His calf is soft nontender.  At this point, staples were removed and Steri-Strips applied.  He will advance to full weightbearing.  He will start physical therapy.  He will follow-up with [place] in 4 weeks time for repeat evaluation and x-rays of the left AP pelvis lateral hip call with concerns or questions in meantime.  Follow-Up Instructions: Return in about 4 weeks (around 08/17/2019).   Orders:  Orders Placed This Encounter  Procedures  . XR HIP UNILAT W OR W/O PELVIS 2-3 VIEWS LEFT   No orders of the defined types were placed in this encounter.   Imaging: Xr Hip Unilat W Or W/o Pelvis 2-3 Views Left  Result Date: 07/20/2019 X-rays demonstrate stable alignment and compression of the fracture.  There is also evidence of bony consolidation   PMFS History: Patient Active Problem List   Diagnosis Date Noted  . Closed dislocation of interphalangeal joint of right ring finger   . Closed fracture of left hip  ([place]) 06/20/2019  . Hypertension 06/20/2019  . Closed fracture of neck of left femur ([place])   . OSA on CPAP 07/05/2018  . Memory disturbance 07/05/2018  . Excessive daytime sleepiness 04/03/2018  . 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 Snoring 04/03/2018  . Hemiplegia affecting dominant side, post-stroke ([place]) 03/07/2017  . Spastic hemiplegia affecting dominant side ([place]) 02/07/2017  . Hemiparesis affecting right side as late effect of cerebrovascular accident ([place]) 12/02/2016  . Right foot drop 12/02/2016  . 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 Depression due to dementia ([place]) 01/28/2015  . Dissection of vertebral artery ([place]) 01/28/2015  . 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 Hemiplegia following CVA (cerebrovascular accident) ([place]) 05/31/2014  . 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 Left pontine stroke ([place]) 05/31/2014  . MCI (mild cognitive impairment) with memory loss 05/31/2014   Past Medical History:  Diagnosis Date  . CKD (chronic kidney disease), stage II   . CVA (cerebral infarction)    Left thalamic, right-sided weakness wih aphagia, right leg brace  . Dyslipidemia   . Elevated PSA    biopsy negative in 2008  . GERD (gastroesophageal reflux disease)   . Hypertension    without medication since 2006  . Major depression   . Seasonal rhinitis     Family History  Problem Relation Age of Onset  . Colon cancer Brother     Past Surgical History:  Procedure Laterality Date  . HIP PINNING,CANNULATED Left 06/20/2019   Procedure: CANNULATED HIP PINNING;  Surgeon: Tarry Kos, MD;  Location: [facility] OR;  Service: Orthopedics;  Laterality: Left;  . NASAL SEPTUM SURGERY    . VASECTOMY     Social History   Occupational History  . Not on file  Tobacco Use  . Smoking status: Never Smoker  . Smokeless tobacco: Never Used  Substance and Sexual Activity  . Alcohol use: No  . Drug use: No   . Sexual activity: Not on file

## 2019-07-24 ENCOUNTER — Inpatient Hospital Stay: Payer: Medicare Other | Admitting: Orthopaedic Surgery

## 2019-08-17 ENCOUNTER — Ambulatory Visit (INDEPENDENT_AMBULATORY_CARE_PROVIDER_SITE_OTHER): Payer: Medicare Other | Admitting: Orthopaedic Surgery

## 2019-08-17 ENCOUNTER — Ambulatory Visit (INDEPENDENT_AMBULATORY_CARE_PROVIDER_SITE_OTHER): Payer: Medicare Other

## 2019-08-17 ENCOUNTER — Other Ambulatory Visit: Payer: Self-pay

## 2019-08-17 ENCOUNTER — Encounter: Payer: Self-pay | Admitting: Orthopaedic Surgery

## 2019-08-17 DIAGNOSIS — S72002D Fracture of unspecified part of neck of left femur, subsequent encounter for closed fracture with routine healing: Secondary | ICD-10-CM | POA: Diagnosis not present

## 2019-08-17 NOTE — Progress Notes (Signed)
Post-Op Visit Note   Patient: [name]           Date of Birth: 03-Sep-1941           MRN: [id number] Visit Date: 08/17/2019 PCP: Wenda Low, MD   Assessment & Plan:  Chief Complaint:  Chief Complaint  Patient presents with  . Left Hip - Pain   Visit Diagnoses:  1. Closed fracture of neck of left femur with routine healing, subsequent encounter     Plan: Patient is a pleasant 78 year old gentleman who presents our clinic today with his daughter.  He is 2 months out left hip cannulated pinning.  He has been doing well.  He has finished home health physical therapy but continues to work on a home exercise program with his daughter.  He is back to baseline ambulating with a walker.  Overall doing very well.  Examination of his left hip reveals a fully healed surgical scar.  No evidence of infection or cellulitis.  He has near full hip flexion.  Calves are soft and nontender.  He is neurovascular intact distally.  At this point, he will continue with his home exercise program.  Follow-up with [place] in 4 weeks time for AP pelvis lateral left hip x-rays.  Follow-Up Instructions: Return in about 4 weeks (around 09/14/2019).   Orders:  Orders Placed This Encounter  Procedures  . XR HIP UNILAT W OR W/O PELVIS 2-3 VIEWS LEFT   No orders of the defined types were placed in this encounter.   Imaging: XR HIP UNILAT W OR W/O PELVIS 2-3 VIEWS LEFT  Result Date: 08/17/2019 X-rays demonstrate stable alignment and compression of the fracture   PMFS History: Patient Active Problem List   Diagnosis Date Noted  . Closed dislocation of interphalangeal joint of right ring finger   . Closed fracture of left hip ([place]) 06/20/2019  . Hypertension 06/20/2019  . Closed fracture of neck of left femur ([place])   . OSA on CPAP 07/05/2018  . Memory disturbance 07/05/2018  . Excessive daytime sleepiness 04/03/2018  . 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 Snoring 04/03/2018  . Hemiplegia affecting  dominant side, post-stroke ([place]) 03/07/2017  . Spastic hemiplegia affecting dominant side ([place]) 02/07/2017  . Hemiparesis affecting right side as late effect of cerebrovascular accident ([place]) 12/02/2016  . Right foot drop 12/02/2016  . 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 Depression due to dementia ([place]) 01/28/2015  . 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 Hemiplegia following CVA (cerebrovascular accident) ([place]) 05/31/2014  . 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 MCI (mild cognitive impairment) with memory loss 05/31/2014   Past Medical History:  Diagnosis Date  . CKD (chronic kidney disease), stage II   . CVA (cerebral infarction)    Left thalamic, right-sided weakness wih aphagia, right leg brace  . Dyslipidemia   . Elevated PSA    biopsy negative in 2008  . GERD (gastroesophageal reflux disease)   . Hypertension    without medication since 2006  . Major depression   . Seasonal rhinitis     Family History  Problem Relation Age of Onset  . Colon cancer Brother     Past Surgical History:  Procedure Laterality Date  . HIP PINNING,CANNULATED Left 06/20/2019   Procedure: CANNULATED HIP PINNING;  Surgeon: Leandrew Koyanagi, MD;  Location: [facility];  Service:  Orthopedics;  Laterality: Left;  . NASAL SEPTUM SURGERY    . VASECTOMY     Social History   Occupational History  . Not on file  Tobacco Use  . Smoking status: Never Smoker  . Smokeless tobacco: Never Used  Substance and Sexual Activity  . Alcohol use: No  . Drug use: No  . Sexual activity: Not on file

## 2019-09-14 ENCOUNTER — Ambulatory Visit: Payer: Medicare Other | Admitting: Orthopaedic Surgery

## 2019-09-30 ENCOUNTER — Other Ambulatory Visit: Payer: Self-pay | Admitting: Neurology

## 2020-01-20 ENCOUNTER — Other Ambulatory Visit: Payer: Self-pay | Admitting: Neurology

## 2020-03-03 ENCOUNTER — Ambulatory Visit: Payer: Medicare Other | Admitting: Neurology

## 2020-04-21 ENCOUNTER — Ambulatory Visit: Payer: Medicare Other | Admitting: Neurology

## 2020-04-22 ENCOUNTER — Encounter: Payer: Self-pay | Admitting: Neurology

## 2020-08-07 ENCOUNTER — Other Ambulatory Visit: Payer: Self-pay | Admitting: Neurology

## 2020-08-18 ENCOUNTER — Telehealth: Payer: Self-pay | Admitting: Neurology

## 2020-08-18 NOTE — Telephone Encounter (Signed)
[place] MD Zella Richer) left a message. It has been over year since the patient has been seen. LVM advising a apt is needed since last seen 06/2019. Once they call back they should schedule for first opening with NP or MD.This can be in office or a virtual visit (mychart would need to be activated) Once apt made med refill can be completed.  PCP can also take over this medication, if patient is stable

## 2020-08-18 NOTE — Telephone Encounter (Signed)
[facility] Zella Richer) called, calling for the patient for a refill for memantine (NAMENDA) 10 MG tablet. Can contact at [phone].

## 2020-09-01 NOTE — Telephone Encounter (Addendum)
[facility] called (Byrd,[name]) to make appt. Scheduled appt with AL,NP tomorrow at 2:30pm. Requested it be VV. Scheduled mychart VV. Sent link to activate mychart and advised on how to use this. She was able to activate and download mychart app.

## 2020-09-02 ENCOUNTER — Telehealth (INDEPENDENT_AMBULATORY_CARE_PROVIDER_SITE_OTHER): Payer: Medicare Other | Admitting: Family Medicine

## 2020-09-02 ENCOUNTER — Encounter: Payer: Self-pay | Admitting: Family Medicine

## 2020-09-02 DIAGNOSIS — F09 Unspecified mental disorder due to known physiological condition: Secondary | ICD-10-CM | POA: Diagnosis not present

## 2020-09-02 DIAGNOSIS — I639 Cerebral infarction, unspecified: Secondary | ICD-10-CM | POA: Diagnosis not present

## 2020-09-02 MED ORDER — MEMANTINE HCL 10 MG PO TABS: 10.0000 mg | ORAL_TABLET | Freq: Two times a day (BID) | ORAL | 3 refills | Status: DC

## 2020-09-02 NOTE — Progress Notes (Signed)
PATIENT: [name] DOB: 03-03-1941  REASON FOR VISIT: follow up HISTORY FROM: patient  Virtual Visit via Telephone Note  I connected with [name] on 09/02/20 at  2:30 PM EST by telephone and verified that I am speaking with the correct person using two identifiers.   I discussed the limitations, risks, security and privacy concerns of performing an evaluation and management service by telephone and the availability of in person appointments. I also discussed with the patient that there may be a patient responsible charge related to this service. The patient expressed understanding and agreed to proceed.   History of Present Illness:  09/02/20 [name] is a 79 y.o. male here today for follow up for OSA and memory loss. He has continued Namenda 10mg  BID. He feels that he is doing fairly well. His daughter presents with him via Mychart and assists with visit. She agrees that he seems to be going quite well. Short term memory loss continues to be an issue but does not seem to have worsened significantly since last being seen. He is able to perform ADL's after being reminded. His daughter fixes meals and doses medicaitons. He does not drive. He is followed closely by PCP for stroke prevention. He has not used CPAP in about 2 years. He could not tolerate therapy. He is not as active as he used to be. His daughter is working on getting him more active.    History (copied from previous note)  [name] meets me today in a wheelchair. His daughter reports he was admitted to hospital 06-20-2019,  After a fall. He fractured his " good ' right leg and dislocated the hip.  He dislocated his right ring finger in the another fall, while in hospital- and the injury was not discovered until 24 hours later, also had a head injury and was scheduled to be transferred to [facility]. His daughter intervened so that he stayed 48 hours and was stabilized.  He is now happy at [facility] and his med list  is shrinking, he is getting few visits , due to need of social distancing.    Observations/Objective:  Generalized: Well developed, in no acute distress  Mentation: Alert oriented day of week, month and season but not to year, he is oriented to place and situation, and some history taking. Follows all commands speech and language fluent   Assessment and Plan:  79 y.o. year old male  has a past medical history of CKD (chronic kidney disease), stage II, CVA (cerebral infarction), Dyslipidemia, Elevated PSA, GERD (gastroesophageal reflux disease), Hypertension, Major depression, and Seasonal rhinitis. here with    ICD-10-CM   1. Left pontine stroke (HCC)  I63.9   2. Cognitive and neurobehavioral dysfunction  F09      Mrk appears to be doing well. We will continue Namenda 10mg  BID. He was encouraged to continue close follow up with PCP for stroke prevention. He was encouraged to work on increasing physical activity. Memory compensation strategies and healthy lifestyle habits advised. He will follow up in 1 year, sooner if needed. He and his daughter verbalize understanding and agreement with this plan.   No orders of the defined types were placed in this encounter.   Meds ordered this encounter  Medications  . memantine (NAMENDA) 10 MG tablet    Sig: Take 1 tablet (10 mg total) by mouth 2 (two) times daily.    Dispense:  180 tablet    Refill:  3    This prescription  was filled on 12/31/2019. Any refills authorized will be placed on file.    Order Specific Question:   Supervising Provider    Answer:   Anson Fret [[id number]]     Follow Up Instructions:  I discussed the assessment and treatment plan with the patient. The patient was provided an opportunity to ask questions and all were answered. The patient agreed with the plan and demonstrated an understanding of the instructions.   The patient was advised to call back or seek an in-person evaluation if the symptoms worsen or  if the condition fails to improve as anticipated.  I provided 15 minutes of non-face-to-face time during this encounter Patient and his daughter are located at their place of residence during visit, provider is in the office.   Shawnie Dapper, NP

## 2020-09-17 DIAGNOSIS — I639 Cerebral infarction, unspecified: Secondary | ICD-10-CM | POA: Diagnosis not present

## 2020-09-17 DIAGNOSIS — N182 Chronic kidney disease, stage 2 (mild): Secondary | ICD-10-CM | POA: Diagnosis not present

## 2020-09-17 DIAGNOSIS — E782 Mixed hyperlipidemia: Secondary | ICD-10-CM | POA: Diagnosis not present

## 2020-09-17 DIAGNOSIS — N1831 Chronic kidney disease, stage 3a: Secondary | ICD-10-CM | POA: Diagnosis not present

## 2020-09-17 DIAGNOSIS — I10 Essential (primary) hypertension: Secondary | ICD-10-CM | POA: Diagnosis not present

## 2020-09-17 DIAGNOSIS — K219 Gastro-esophageal reflux disease without esophagitis: Secondary | ICD-10-CM | POA: Diagnosis not present

## 2020-09-18 DIAGNOSIS — R338 Other retention of urine: Secondary | ICD-10-CM | POA: Diagnosis not present

## 2020-10-13 DIAGNOSIS — I639 Cerebral infarction, unspecified: Secondary | ICD-10-CM | POA: Diagnosis not present

## 2020-10-13 DIAGNOSIS — N1831 Chronic kidney disease, stage 3a: Secondary | ICD-10-CM | POA: Diagnosis not present

## 2020-10-13 DIAGNOSIS — K219 Gastro-esophageal reflux disease without esophagitis: Secondary | ICD-10-CM | POA: Diagnosis not present

## 2020-10-13 DIAGNOSIS — E782 Mixed hyperlipidemia: Secondary | ICD-10-CM | POA: Diagnosis not present

## 2020-10-13 DIAGNOSIS — I10 Essential (primary) hypertension: Secondary | ICD-10-CM | POA: Diagnosis not present

## 2020-11-13 DIAGNOSIS — N182 Chronic kidney disease, stage 2 (mild): Secondary | ICD-10-CM | POA: Diagnosis not present

## 2020-11-13 DIAGNOSIS — E782 Mixed hyperlipidemia: Secondary | ICD-10-CM | POA: Diagnosis not present

## 2020-11-13 DIAGNOSIS — I10 Essential (primary) hypertension: Secondary | ICD-10-CM | POA: Diagnosis not present

## 2020-11-13 DIAGNOSIS — K219 Gastro-esophageal reflux disease without esophagitis: Secondary | ICD-10-CM | POA: Diagnosis not present

## 2020-11-13 DIAGNOSIS — N1831 Chronic kidney disease, stage 3a: Secondary | ICD-10-CM | POA: Diagnosis not present

## 2020-11-13 DIAGNOSIS — I639 Cerebral infarction, unspecified: Secondary | ICD-10-CM | POA: Diagnosis not present

## 2020-11-27 DIAGNOSIS — Z1211 Encounter for screening for malignant neoplasm of colon: Secondary | ICD-10-CM | POA: Diagnosis not present

## 2020-11-27 DIAGNOSIS — Z1389 Encounter for screening for other disorder: Secondary | ICD-10-CM | POA: Diagnosis not present

## 2020-11-27 DIAGNOSIS — E559 Vitamin D deficiency, unspecified: Secondary | ICD-10-CM | POA: Diagnosis not present

## 2020-11-27 DIAGNOSIS — I10 Essential (primary) hypertension: Secondary | ICD-10-CM | POA: Diagnosis not present

## 2020-11-27 DIAGNOSIS — R7303 Prediabetes: Secondary | ICD-10-CM | POA: Diagnosis not present

## 2020-11-27 DIAGNOSIS — I639 Cerebral infarction, unspecified: Secondary | ICD-10-CM | POA: Diagnosis not present

## 2020-11-27 DIAGNOSIS — N1831 Chronic kidney disease, stage 3a: Secondary | ICD-10-CM | POA: Diagnosis not present

## 2020-11-27 DIAGNOSIS — E782 Mixed hyperlipidemia: Secondary | ICD-10-CM | POA: Diagnosis not present

## 2020-11-27 DIAGNOSIS — G8191 Hemiplegia, unspecified affecting right dominant side: Secondary | ICD-10-CM | POA: Diagnosis not present

## 2020-11-27 DIAGNOSIS — Z0000 Encounter for general adult medical examination without abnormal findings: Secondary | ICD-10-CM | POA: Diagnosis not present

## 2020-12-10 DIAGNOSIS — E782 Mixed hyperlipidemia: Secondary | ICD-10-CM | POA: Diagnosis not present

## 2020-12-10 DIAGNOSIS — I10 Essential (primary) hypertension: Secondary | ICD-10-CM | POA: Diagnosis not present

## 2020-12-10 DIAGNOSIS — N1831 Chronic kidney disease, stage 3a: Secondary | ICD-10-CM | POA: Diagnosis not present

## 2020-12-10 DIAGNOSIS — K219 Gastro-esophageal reflux disease without esophagitis: Secondary | ICD-10-CM | POA: Diagnosis not present

## 2021-01-09 DIAGNOSIS — N1831 Chronic kidney disease, stage 3a: Secondary | ICD-10-CM | POA: Diagnosis not present

## 2021-01-09 DIAGNOSIS — I639 Cerebral infarction, unspecified: Secondary | ICD-10-CM | POA: Diagnosis not present

## 2021-01-09 DIAGNOSIS — E782 Mixed hyperlipidemia: Secondary | ICD-10-CM | POA: Diagnosis not present

## 2021-01-09 DIAGNOSIS — N182 Chronic kidney disease, stage 2 (mild): Secondary | ICD-10-CM | POA: Diagnosis not present

## 2021-01-09 DIAGNOSIS — K219 Gastro-esophageal reflux disease without esophagitis: Secondary | ICD-10-CM | POA: Diagnosis not present

## 2021-01-09 DIAGNOSIS — I10 Essential (primary) hypertension: Secondary | ICD-10-CM | POA: Diagnosis not present

## 2021-02-17 DIAGNOSIS — N1831 Chronic kidney disease, stage 3a: Secondary | ICD-10-CM | POA: Diagnosis not present

## 2021-02-17 DIAGNOSIS — N182 Chronic kidney disease, stage 2 (mild): Secondary | ICD-10-CM | POA: Diagnosis not present

## 2021-02-17 DIAGNOSIS — E782 Mixed hyperlipidemia: Secondary | ICD-10-CM | POA: Diagnosis not present

## 2021-02-17 DIAGNOSIS — K219 Gastro-esophageal reflux disease without esophagitis: Secondary | ICD-10-CM | POA: Diagnosis not present

## 2021-02-17 DIAGNOSIS — I10 Essential (primary) hypertension: Secondary | ICD-10-CM | POA: Diagnosis not present

## 2021-02-17 DIAGNOSIS — I639 Cerebral infarction, unspecified: Secondary | ICD-10-CM | POA: Diagnosis not present

## 2021-03-11 DIAGNOSIS — E782 Mixed hyperlipidemia: Secondary | ICD-10-CM | POA: Diagnosis not present

## 2021-03-11 DIAGNOSIS — I639 Cerebral infarction, unspecified: Secondary | ICD-10-CM | POA: Diagnosis not present

## 2021-03-11 DIAGNOSIS — N182 Chronic kidney disease, stage 2 (mild): Secondary | ICD-10-CM | POA: Diagnosis not present

## 2021-03-11 DIAGNOSIS — I10 Essential (primary) hypertension: Secondary | ICD-10-CM | POA: Diagnosis not present

## 2021-03-11 DIAGNOSIS — K219 Gastro-esophageal reflux disease without esophagitis: Secondary | ICD-10-CM | POA: Diagnosis not present

## 2021-03-11 DIAGNOSIS — N1831 Chronic kidney disease, stage 3a: Secondary | ICD-10-CM | POA: Diagnosis not present

## 2021-04-13 DIAGNOSIS — I10 Essential (primary) hypertension: Secondary | ICD-10-CM | POA: Diagnosis not present

## 2021-04-13 DIAGNOSIS — E782 Mixed hyperlipidemia: Secondary | ICD-10-CM | POA: Diagnosis not present

## 2021-04-13 DIAGNOSIS — N1831 Chronic kidney disease, stage 3a: Secondary | ICD-10-CM | POA: Diagnosis not present

## 2021-04-13 DIAGNOSIS — I639 Cerebral infarction, unspecified: Secondary | ICD-10-CM | POA: Diagnosis not present

## 2021-04-13 DIAGNOSIS — K219 Gastro-esophageal reflux disease without esophagitis: Secondary | ICD-10-CM | POA: Diagnosis not present

## 2021-06-03 DIAGNOSIS — G8191 Hemiplegia, unspecified affecting right dominant side: Secondary | ICD-10-CM | POA: Diagnosis not present

## 2021-06-03 DIAGNOSIS — N1831 Chronic kidney disease, stage 3a: Secondary | ICD-10-CM | POA: Diagnosis not present

## 2021-06-03 DIAGNOSIS — R7303 Prediabetes: Secondary | ICD-10-CM | POA: Diagnosis not present

## 2021-06-03 DIAGNOSIS — I10 Essential (primary) hypertension: Secondary | ICD-10-CM | POA: Diagnosis not present

## 2021-06-03 DIAGNOSIS — E782 Mixed hyperlipidemia: Secondary | ICD-10-CM | POA: Diagnosis not present

## 2021-06-03 DIAGNOSIS — Z23 Encounter for immunization: Secondary | ICD-10-CM | POA: Diagnosis not present

## 2021-06-05 DIAGNOSIS — E782 Mixed hyperlipidemia: Secondary | ICD-10-CM | POA: Diagnosis not present

## 2021-06-05 DIAGNOSIS — I10 Essential (primary) hypertension: Secondary | ICD-10-CM | POA: Diagnosis not present

## 2021-06-05 DIAGNOSIS — K219 Gastro-esophageal reflux disease without esophagitis: Secondary | ICD-10-CM | POA: Diagnosis not present

## 2021-06-05 DIAGNOSIS — I639 Cerebral infarction, unspecified: Secondary | ICD-10-CM | POA: Diagnosis not present

## 2021-06-05 DIAGNOSIS — N1831 Chronic kidney disease, stage 3a: Secondary | ICD-10-CM | POA: Diagnosis not present

## 2021-07-06 DIAGNOSIS — E782 Mixed hyperlipidemia: Secondary | ICD-10-CM | POA: Diagnosis not present

## 2021-07-06 DIAGNOSIS — K219 Gastro-esophageal reflux disease without esophagitis: Secondary | ICD-10-CM | POA: Diagnosis not present

## 2021-07-06 DIAGNOSIS — N1831 Chronic kidney disease, stage 3a: Secondary | ICD-10-CM | POA: Diagnosis not present

## 2021-07-06 DIAGNOSIS — I10 Essential (primary) hypertension: Secondary | ICD-10-CM | POA: Diagnosis not present

## 2021-07-06 DIAGNOSIS — I639 Cerebral infarction, unspecified: Secondary | ICD-10-CM | POA: Diagnosis not present

## 2021-07-22 DIAGNOSIS — R3911 Hesitancy of micturition: Secondary | ICD-10-CM | POA: Diagnosis not present

## 2021-07-22 DIAGNOSIS — R3916 Straining to void: Secondary | ICD-10-CM | POA: Diagnosis not present

## 2021-07-22 DIAGNOSIS — R3912 Poor urinary stream: Secondary | ICD-10-CM | POA: Diagnosis not present

## 2021-08-03 ENCOUNTER — Other Ambulatory Visit: Payer: Self-pay

## 2021-08-03 MED ORDER — MEMANTINE HCL 10 MG PO TABS: 10.0000 mg | ORAL_TABLET | Freq: Two times a day (BID) | ORAL | 0 refills | Status: DC

## 2021-08-05 DIAGNOSIS — K219 Gastro-esophageal reflux disease without esophagitis: Secondary | ICD-10-CM | POA: Diagnosis not present

## 2021-08-05 DIAGNOSIS — E782 Mixed hyperlipidemia: Secondary | ICD-10-CM | POA: Diagnosis not present

## 2021-08-05 DIAGNOSIS — I639 Cerebral infarction, unspecified: Secondary | ICD-10-CM | POA: Diagnosis not present

## 2021-08-05 DIAGNOSIS — I10 Essential (primary) hypertension: Secondary | ICD-10-CM | POA: Diagnosis not present

## 2021-08-05 DIAGNOSIS — N1831 Chronic kidney disease, stage 3a: Secondary | ICD-10-CM | POA: Diagnosis not present

## 2021-09-02 DIAGNOSIS — R3916 Straining to void: Secondary | ICD-10-CM | POA: Diagnosis not present

## 2021-09-02 DIAGNOSIS — N3020 Other chronic cystitis without hematuria: Secondary | ICD-10-CM | POA: Diagnosis not present

## 2021-09-04 DIAGNOSIS — K219 Gastro-esophageal reflux disease without esophagitis: Secondary | ICD-10-CM | POA: Diagnosis not present

## 2021-09-04 DIAGNOSIS — N1831 Chronic kidney disease, stage 3a: Secondary | ICD-10-CM | POA: Diagnosis not present

## 2021-09-04 DIAGNOSIS — E782 Mixed hyperlipidemia: Secondary | ICD-10-CM | POA: Diagnosis not present

## 2021-09-04 DIAGNOSIS — I10 Essential (primary) hypertension: Secondary | ICD-10-CM | POA: Diagnosis not present

## 2021-09-09 ENCOUNTER — Other Ambulatory Visit: Payer: Self-pay

## 2021-09-09 MED ORDER — MEMANTINE HCL 10 MG PO TABS: 10.0000 mg | ORAL_TABLET | Freq: Two times a day (BID) | ORAL | 0 refills | Status: DC

## 2021-10-06 DIAGNOSIS — I10 Essential (primary) hypertension: Secondary | ICD-10-CM | POA: Diagnosis not present

## 2021-10-06 DIAGNOSIS — E782 Mixed hyperlipidemia: Secondary | ICD-10-CM | POA: Diagnosis not present

## 2021-10-06 DIAGNOSIS — N1831 Chronic kidney disease, stage 3a: Secondary | ICD-10-CM | POA: Diagnosis not present

## 2021-10-16 IMAGING — DX DG HIP (WITH OR WITHOUT PELVIS) 3-4V BILAT
5 series · 5 of 5 positions shown · non-contrast
Comparison: 06/19/2019

CLINICAL DATA: Fall.  Left hip surgery 2 days ago.

EXAM:
DG HIP (WITH OR WITHOUT PELVIS) 3-4V BILAT

[pelvis ap]
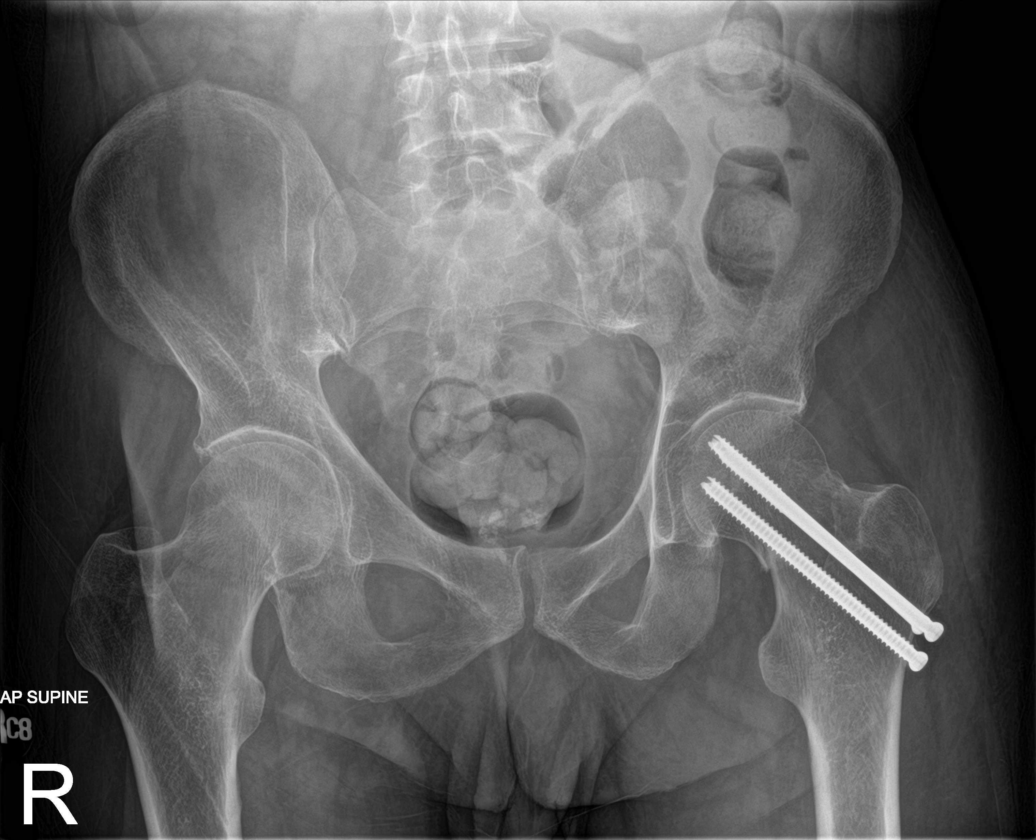

[hip ap (1 of 2)]
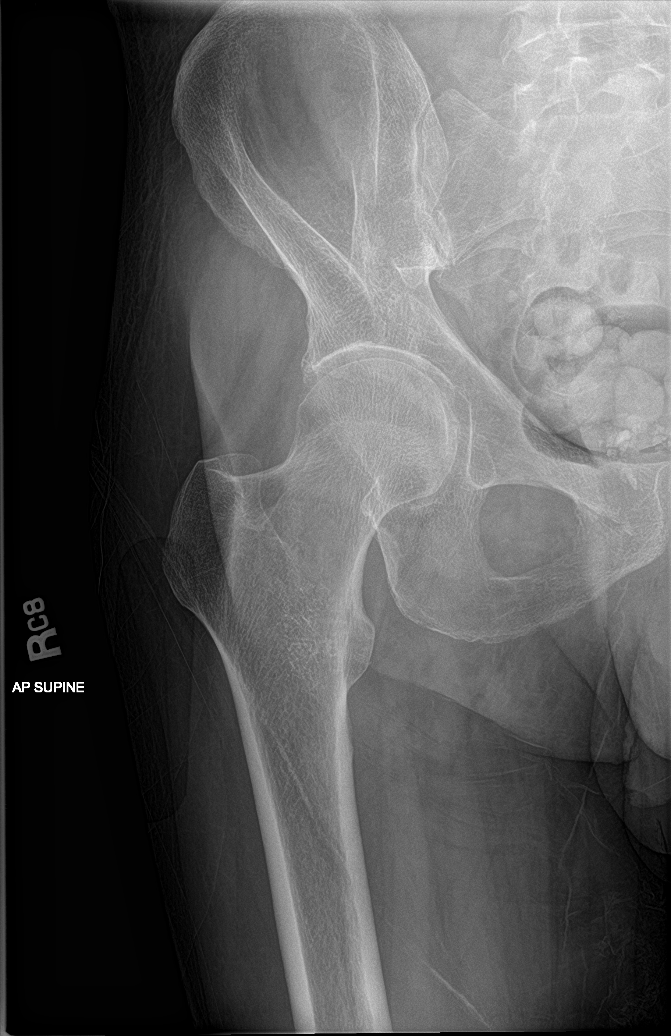

[hip lat (1 of 2)]
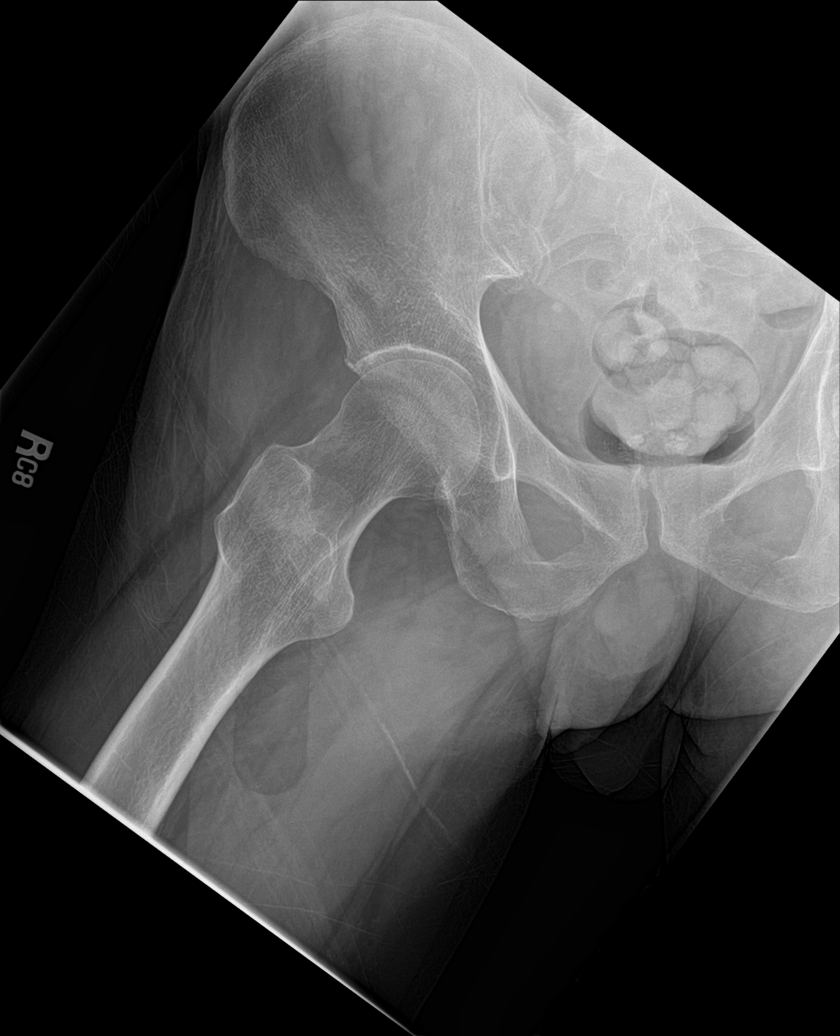

[hip ap (2 of 2)]
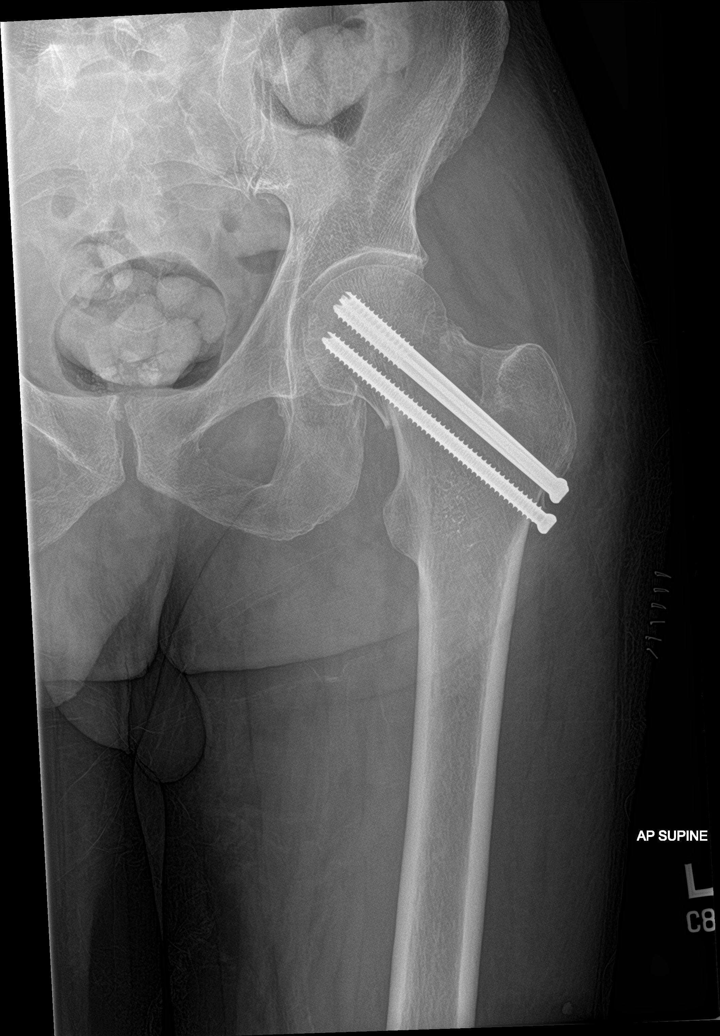

[hip lat (2 of 2)]
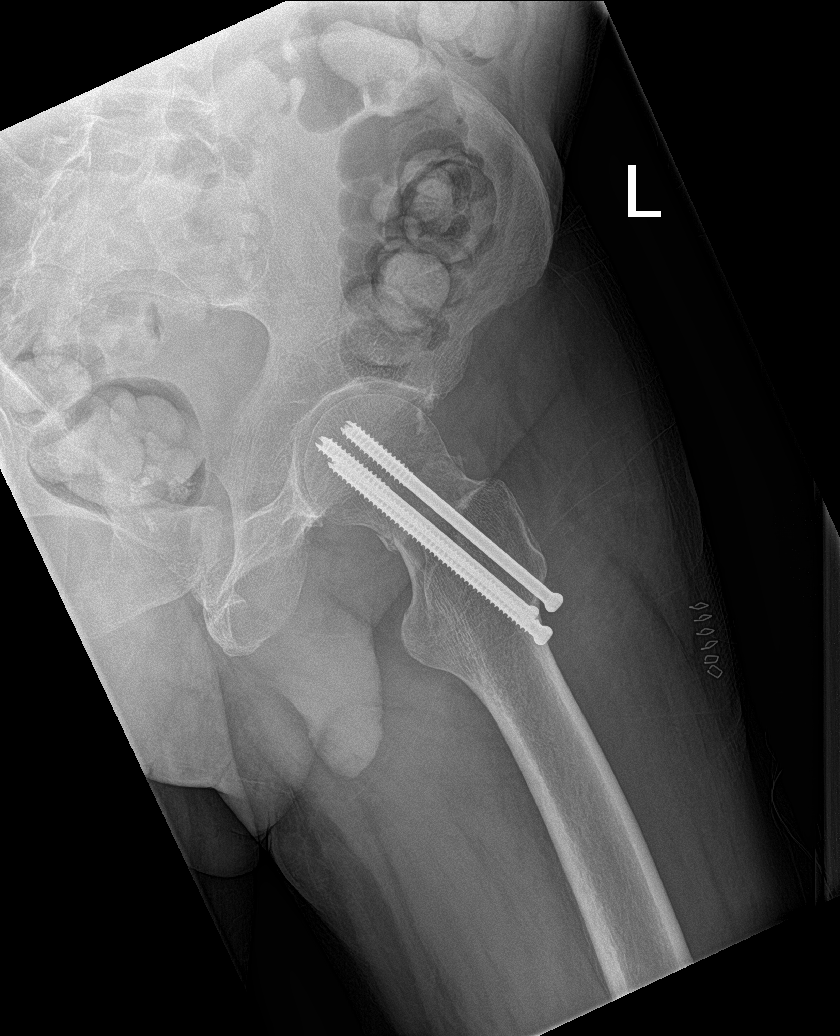

[5 of 5 positions shown; findings below may reference images not displayed]

FINDINGS: Screws across the previously seen left femoral neck fracture. No
visible hardware complicating feature. No new/additional acute bony
abnormality.
IMPRESSION: Internal fixation across the previously seen left femoral neck
fracture. No new acute bony abnormality.

## 2021-10-23 ENCOUNTER — Telehealth: Payer: Self-pay | Admitting: Family Medicine

## 2021-10-23 NOTE — Telephone Encounter (Addendum)
Pt is needing a refill on his memantine (NAMENDA) 10 MG tablet sent in to the [facility]

## 2021-10-26 MED ORDER — MEMANTINE HCL 10 MG PO TABS: 10.0000 mg | ORAL_TABLET | Freq: Two times a day (BID) | ORAL | 4 refills | Status: DC

## 2021-10-26 NOTE — Telephone Encounter (Signed)
Pt has upcoming appt 03/03/22. Escribed refills and advised on rx that he must keep this f/u for any future refills.

## 2021-10-26 NOTE — Addendum Note (Signed)
Addended by: Arther Abbott on: 10/26/2021 07:47 AM   Modules accepted: Orders

## 2021-11-03 DIAGNOSIS — I10 Essential (primary) hypertension: Secondary | ICD-10-CM | POA: Diagnosis not present

## 2021-11-03 DIAGNOSIS — E782 Mixed hyperlipidemia: Secondary | ICD-10-CM | POA: Diagnosis not present

## 2021-11-30 DIAGNOSIS — H6120 Impacted cerumen, unspecified ear: Secondary | ICD-10-CM | POA: Diagnosis not present

## 2021-11-30 DIAGNOSIS — Z0000 Encounter for general adult medical examination without abnormal findings: Secondary | ICD-10-CM | POA: Diagnosis not present

## 2021-11-30 DIAGNOSIS — E782 Mixed hyperlipidemia: Secondary | ICD-10-CM | POA: Diagnosis not present

## 2021-11-30 DIAGNOSIS — G8191 Hemiplegia, unspecified affecting right dominant side: Secondary | ICD-10-CM | POA: Diagnosis not present

## 2021-11-30 DIAGNOSIS — I10 Essential (primary) hypertension: Secondary | ICD-10-CM | POA: Diagnosis not present

## 2021-11-30 DIAGNOSIS — R7309 Other abnormal glucose: Secondary | ICD-10-CM | POA: Diagnosis not present

## 2021-11-30 DIAGNOSIS — N1831 Chronic kidney disease, stage 3a: Secondary | ICD-10-CM | POA: Diagnosis not present

## 2021-11-30 DIAGNOSIS — K219 Gastro-esophageal reflux disease without esophagitis: Secondary | ICD-10-CM | POA: Diagnosis not present

## 2021-11-30 DIAGNOSIS — I69359 Hemiplegia and hemiparesis following cerebral infarction affecting unspecified side: Secondary | ICD-10-CM | POA: Diagnosis not present

## 2021-12-04 DIAGNOSIS — E782 Mixed hyperlipidemia: Secondary | ICD-10-CM | POA: Diagnosis not present

## 2021-12-04 DIAGNOSIS — I10 Essential (primary) hypertension: Secondary | ICD-10-CM | POA: Diagnosis not present

## 2021-12-21 DIAGNOSIS — H6123 Impacted cerumen, bilateral: Secondary | ICD-10-CM | POA: Diagnosis not present

## 2021-12-30 DIAGNOSIS — H6123 Impacted cerumen, bilateral: Secondary | ICD-10-CM | POA: Diagnosis not present

## 2022-01-29 DIAGNOSIS — I10 Essential (primary) hypertension: Secondary | ICD-10-CM | POA: Diagnosis not present

## 2022-01-29 DIAGNOSIS — I639 Cerebral infarction, unspecified: Secondary | ICD-10-CM | POA: Diagnosis not present

## 2022-01-29 DIAGNOSIS — N1831 Chronic kidney disease, stage 3a: Secondary | ICD-10-CM | POA: Diagnosis not present

## 2022-01-29 DIAGNOSIS — E782 Mixed hyperlipidemia: Secondary | ICD-10-CM | POA: Diagnosis not present

## 2022-01-29 DIAGNOSIS — K219 Gastro-esophageal reflux disease without esophagitis: Secondary | ICD-10-CM | POA: Diagnosis not present

## 2022-03-02 ENCOUNTER — Other Ambulatory Visit: Payer: Self-pay

## 2022-03-02 MED ORDER — MEMANTINE HCL 10 MG PO TABS: 10.0000 mg | ORAL_TABLET | Freq: Two times a day (BID) | ORAL | 0 refills | Status: DC

## 2022-03-03 ENCOUNTER — Ambulatory Visit: Payer: Medicare Other | Admitting: Family Medicine

## 2022-03-24 DIAGNOSIS — R3916 Straining to void: Secondary | ICD-10-CM | POA: Diagnosis not present

## 2022-03-31 ENCOUNTER — Other Ambulatory Visit: Payer: Self-pay

## 2022-03-31 MED ORDER — MEMANTINE HCL 10 MG PO TABS: 10.0000 mg | ORAL_TABLET | Freq: Two times a day (BID) | ORAL | 0 refills | Status: DC

## 2022-04-13 NOTE — Patient Instructions (Signed)
Below is our plan:  We will continue bupropion 150mg  daily and memantine 10mg  twice daily.   Please make sure you are staying well hydrated. I recommend 50-60 ounces daily. Well balanced diet and regular exercise encouraged. Consistent sleep schedule with 6-8 hours recommended.   Please continue follow up with care team as directed.   Follow up with me in 1 year   You may receive a survey regarding today's visit. I encourage you to leave honest feed back as I do use this information to improve patient care. Thank you for seeing me today!   Management of Memory Problems   There are some general things you can do to help manage your memory problems.  Your memory may not in fact recover, but by using techniques and strategies you will be able to manage your memory difficulties better.   1)  Establish a routine. Try to establish and then stick to a regular routine.  By doing this, you will get used to what to expect and you will reduce the need to rely on your memory.  Also, try to do things at the same time of day, such as taking your medication or checking your calendar first thing in the morning. Think about think that you can do as a part of a regular routine and make a list.  Then enter them into a daily planner to remind you.  This will help you establish a routine.   2)  Organize your environment. Organize your environment so that it is uncluttered.  Decrease visual stimulation.  Place everyday items such as keys or cell phone in the same place every day (ie.  Basket next to front door) Use post it notes with a brief message to yourself (ie. Turn off light, lock the door) Use labels to indicate where things go (ie. Which cupboards are for food, dishes, etc.) Keep a notepad and pen by the telephone to take messages   3)  Memory Aids A diary or journal/notebook/daily planner Making a list (shopping list, chore list, to do list that needs to be done) Using an alarm as a reminder (kitchen  timer or cell phone alarm) Using cell phone to store information (Notes, Calendar, Reminders) Calendar/White board placed in a prominent position Post-it notes   In order for memory aids to be useful, you need to have good habits.  It's no good remembering to make a note in your journal if you don't remember to look in it.  Try setting aside a certain time of day to look in journal.   4)  Improving mood and managing fatigue. There may be other factors that contribute to memory difficulties.  Factors, such as anxiety, depression and tiredness can affect memory. Regular gentle exercise can help improve your mood and give you more energy. Simple relaxation techniques may help relieve symptoms of anxiety Try to get back to completing activities or hobbies you enjoyed doing in the past. Learn to pace yourself through activities to decrease fatigue. Find out about some local support groups where you can share experiences with others. Try and achieve 7-8 hours of sleep at night.

## 2022-04-13 NOTE — Progress Notes (Unsigned)
No chief complaint on file.   HISTORY OF PRESENT ILLNESS:  04/13/22 ALL:  Tristain Daily is a 81 y.o. male here today for follow up for OSA and memory loss. He was last seen 08/2020. He continues memantine 10mg  BID and bupropion 150mg  daily.   He continues regular follow up with PCP. Is is taking atorvastatin and asa 81mg  daily. BP is . He was unable to tolerate CPAP.   09/02/2020 ALL: Savan Ruta is a 81 y.o. male here today for follow up for OSA and memory loss. He has continued Namenda 10mg  BID. He feels that he is doing fairly well. His daughter presents with him via Mychart and assists with visit. She agrees that he seems to be going quite well. Short term memory loss continues to be an issue but does not seem to have worsened significantly since last being seen. He is able to perform ADL's after being reminded. His daughter fixes meals and doses medicaitons. He does not drive. He is followed closely by PCP for stroke prevention. He has not used CPAP in about 2 years. He could not tolerate therapy. He is not as active as he used to be. His daughter is working on getting him more active.   History (copied from Dr Dohmeier's previous note) Mr Gladu meets me today in a wheelchair. His daughter reports he was admitted to hospital 06-20-2019,  After a fall. He fractured his " good ' right leg and dislocated the hip.  He dislocated his right ring finger in the another fall, while in hospital- and the injury was not discovered until 24 hours later, also had a head injury and was scheduled to be transferred to [facility]. His daughter intervened so that he stayed 48 hours and was stabilized.  He is now happy at [facility] and his med list is shrinking, he is getting few visits , due to need of social distancing.    REVIEW OF SYSTEMS: Out of a complete 14 system review of symptoms, the patient complains only of the following symptoms, and all other reviewed systems are  negative.   ALLERGIES: No Known Allergies   HOME MEDICATIONS: Outpatient Medications Prior to Visit  Medication Sig Dispense Refill   amLODipine (NORVASC) 2.5 MG tablet Take 2.5 mg by mouth daily.     aspirin 81 MG tablet Take 81 mg by mouth every other day. Sunday, Tuesday, Thursday and Saturday     atorvastatin (LIPITOR) 10 MG tablet Take 10 mg by mouth daily.      B Complex Vitamins (VITAMIN B COMPLEX PO) Take 1 tablet by mouth daily.      buPROPion (WELLBUTRIN XL) 150 MG 24 hr tablet TAKE 1 TABLET(150 MG) BY MOUTH DAILY 90 tablet 2   Cholecalciferol (VITAMIN D3) 2000 units TABS Take 2,000 Units by mouth.     CRANBERRY EXTRACT PO Take 450 mg by mouth daily.     DULoxetine (CYMBALTA) 60 MG capsule Take 60 mg by mouth daily.     enoxaparin (LOVENOX) 40 MG/0.4ML injection Inject 0.4 mLs (40 mg total) into the skin daily. 0.4 mL 13   memantine (NAMENDA) 10 MG tablet Take 1 tablet (10 mg total) by mouth 2 (two) times daily. MUST KEEP APPT FOR 04/14/22 FOR ANY FUTURE REFILLS 60 tablet 0   Misc Natural Products (OSTEO BI-FLEX TRIPLE STRENGTH PO) Take 1 tablet by mouth daily.     oxyCODONE-acetaminophen (PERCOCET) 5-325 MG tablet Take 1-2 tablets by mouth every 8 (eight) hours as needed  for severe pain. 30 tablet 0   No facility-administered medications prior to visit.     PAST MEDICAL HISTORY: Past Medical History:  Diagnosis Date   CKD (chronic kidney disease), stage II    CVA (cerebral infarction)    Left thalamic, right-sided weakness wih aphagia, right leg brace   Dyslipidemia    Elevated PSA    biopsy negative in 2008   GERD (gastroesophageal reflux disease)    Hypertension    without medication since 2006   Major depression    Seasonal rhinitis      PAST SURGICAL HISTORY: Past Surgical History:  Procedure Laterality Date   HIP PINNING,CANNULATED Left 06/20/2019   Procedure: CANNULATED HIP PINNING;  Surgeon: Tarry Kos, MD;  Location: [facility];  Service: Orthopedics;   Laterality: Left;   NASAL SEPTUM SURGERY     VASECTOMY       FAMILY HISTORY: Family History  Problem Relation Age of Onset   Colon cancer Brother      SOCIAL HISTORY: Social History   Socioeconomic History   Marital status: Widowed    Spouse name: [name]   Number of children: 2   Years of education: 12   Highest education level: Not on file  Occupational History   Not on file  Tobacco Use   Smoking status: Never   Smokeless tobacco: Never  Substance and Sexual Activity   Alcohol use: No   Drug use: No   Sexual activity: Not on file  Other Topics Concern   Not on file  Social History Narrative   Patient is married [name]) and lives at home with his wife.   Patient is disabled.    Patient has two adult children and two grandchildren.   Patient has a high school education.   Patient is left-handed.   Patient drinks one cup of coffee daily.            Social Determinants of Health   Financial Resource Strain: Not on file  Food Insecurity: Not on file  Transportation Needs: Not on file  Physical Activity: Not on file  Stress: Not on file  Social Connections: Not on file  Intimate Partner Violence: Not on file     PHYSICAL EXAM  There were no vitals filed for this visit. There is no height or weight on file to calculate BMI.  Generalized: Well developed, in no acute distress  Cardiology: normal rate and rhythm, no murmur auscultated  Respiratory: clear to auscultation bilaterally    Neurological examination  Mentation: Alert oriented to time, place, history taking. Follows all commands speech and language fluent Cranial nerve II-XII: Pupils were equal round reactive to light. Extraocular movements were full, visual field were full on confrontational test. Facial sensation and strength were normal. Uvula tongue midline. Head turning and shoulder shrug  were normal and symmetric. Motor: The motor testing reveals 5 over 5 strength of all 4 extremities. Good  symmetric motor tone is noted throughout.  Sensory: Sensory testing is intact to soft touch on all 4 extremities. No evidence of extinction is noted.  Coordination: Cerebellar testing reveals good finger-nose-finger and heel-to-shin bilaterally.  Gait and station: Gait is normal. Tandem gait is normal. Romberg is negative. No drift is seen.  Reflexes: Deep tendon reflexes are symmetric and normal bilaterally.    DIAGNOSTIC DATA (LABS, IMAGING, TESTING) - I reviewed patient records, labs, notes, testing and imaging myself where available.  Lab Results  Component Value Date   WBC 16.5 (H)  06/23/2019   HGB 11.1 (L) 06/23/2019   HCT 35.4 (L) 06/23/2019   MCV 92.2 06/23/2019   PLT 315 06/23/2019      Component Value Date/Time   NA 142 06/22/2019 0258   K 4.0 06/22/2019 0258   CL 107 06/22/2019 0258   CO2 25 06/22/2019 0258   GLUCOSE 122 (H) 06/22/2019 0258   BUN 20 06/22/2019 0258   CREATININE 1.42 (H) 06/22/2019 0258   CALCIUM 8.9 06/22/2019 0258   GFRNONAA 48 (L) 06/22/2019 0258   GFRAA 55 (L) 06/22/2019 0258   No results found for: "CHOL", "HDL", "LDLCALC", "LDLDIRECT", "TRIG", "CHOLHDL" No results found for: "HGBA1C" No results found for: "VITAMINB12" No results found for: "TSH"     07/04/2019    4:04 PM 07/05/2018    1:19 PM 04/03/2018    3:02 PM  MMSE - Mini Mental State Exam  Orientation to time 3 1 3   Orientation to Place 5 5 5   Registration 3 3 3   Attention/ Calculation 1 4 3   Recall 3 3 0  Language- name 2 objects 2 2 2   Language- repeat 1 1 1   Language- follow 3 step command 3 3 3   Language- read & follow direction 1 1 1   Write a sentence 0 0 1  Copy design 0 1 1  Total score 22 24 23         09/13/2017   10:54 AM 05/31/2014   10:47 AM  Montreal Cognitive Assessment   Visuospatial/ Executive (0/5) 2 3  Naming (0/3) 3 3  Attention: Read list of digits (0/2) 2 2  Attention: Read list of letters (0/1) 1 1  Attention: Serial 7 subtraction starting at 100  (0/3) 2 3  Language: Repeat phrase (0/2) 1 2  Language : Fluency (0/1) 0 1  Abstraction (0/2) 2 2  Delayed Recall (0/5) 1 0  Orientation (0/6) 5 4  Total 19 21     ASSESSMENT AND PLAN  81 y.o. year old male  has a past medical history of CKD (chronic kidney disease), stage II, CVA (cerebral infarction), Dyslipidemia, Elevated PSA, GERD (gastroesophageal reflux disease), Hypertension, Major depression, and Seasonal rhinitis. here with    No diagnosis found.  Eugen Schear ***.  Healthy lifestyle habits encouraged. *** will follow up with PCP as directed. *** will return to see me in ***, sooner if needed. *** verbalizes understanding and agreement with this plan.   No orders of the defined types were placed in this encounter.    No orders of the defined types were placed in this encounter.    Debbora Presto, MSN, FNP-C 04/13/2022, 3:45 PM  [facility] [address] [place], [place] [address] [phone]

## 2022-04-14 ENCOUNTER — Encounter: Payer: Self-pay | Admitting: Family Medicine

## 2022-04-14 ENCOUNTER — Ambulatory Visit: Payer: Medicare Other | Admitting: Family Medicine

## 2022-04-14 VITALS — BP 133/75 | HR 91 | Ht 70.0 in | Wt 155.0 lb

## 2022-04-14 DIAGNOSIS — F09 Unspecified mental disorder due to known physiological condition: Secondary | ICD-10-CM | POA: Diagnosis not present

## 2022-04-14 DIAGNOSIS — I639 Cerebral infarction, unspecified: Secondary | ICD-10-CM | POA: Diagnosis not present

## 2022-04-14 DIAGNOSIS — F0393 Unspecified dementia, unspecified severity, with mood disturbance: Secondary | ICD-10-CM

## 2022-04-14 MED ORDER — BUPROPION HCL ER (XL) 150 MG PO TB24: ORAL_TABLET | ORAL | 3 refills | Status: DC

## 2022-04-14 MED ORDER — MEMANTINE HCL 10 MG PO TABS: 10.0000 mg | ORAL_TABLET | Freq: Two times a day (BID) | ORAL | 3 refills | Status: DC

## 2022-05-05 DIAGNOSIS — I10 Essential (primary) hypertension: Secondary | ICD-10-CM | POA: Diagnosis not present

## 2022-05-05 DIAGNOSIS — N182 Chronic kidney disease, stage 2 (mild): Secondary | ICD-10-CM | POA: Diagnosis not present

## 2022-05-05 DIAGNOSIS — E782 Mixed hyperlipidemia: Secondary | ICD-10-CM | POA: Diagnosis not present

## 2022-06-01 DIAGNOSIS — E782 Mixed hyperlipidemia: Secondary | ICD-10-CM | POA: Diagnosis not present

## 2022-06-01 DIAGNOSIS — I10 Essential (primary) hypertension: Secondary | ICD-10-CM | POA: Diagnosis not present

## 2022-06-01 DIAGNOSIS — N182 Chronic kidney disease, stage 2 (mild): Secondary | ICD-10-CM | POA: Diagnosis not present

## 2022-07-14 DIAGNOSIS — R7303 Prediabetes: Secondary | ICD-10-CM | POA: Diagnosis not present

## 2022-07-14 DIAGNOSIS — Z23 Encounter for immunization: Secondary | ICD-10-CM | POA: Diagnosis not present

## 2022-07-14 DIAGNOSIS — I639 Cerebral infarction, unspecified: Secondary | ICD-10-CM | POA: Diagnosis not present

## 2022-07-14 DIAGNOSIS — I10 Essential (primary) hypertension: Secondary | ICD-10-CM | POA: Diagnosis not present

## 2022-07-14 DIAGNOSIS — E782 Mixed hyperlipidemia: Secondary | ICD-10-CM | POA: Diagnosis not present

## 2022-07-14 DIAGNOSIS — G8191 Hemiplegia, unspecified affecting right dominant side: Secondary | ICD-10-CM | POA: Diagnosis not present

## 2022-07-14 DIAGNOSIS — N1831 Chronic kidney disease, stage 3a: Secondary | ICD-10-CM | POA: Diagnosis not present

## 2022-10-06 DIAGNOSIS — I10 Essential (primary) hypertension: Secondary | ICD-10-CM | POA: Diagnosis not present

## 2022-10-06 DIAGNOSIS — N182 Chronic kidney disease, stage 2 (mild): Secondary | ICD-10-CM | POA: Diagnosis not present

## 2022-10-06 DIAGNOSIS — K219 Gastro-esophageal reflux disease without esophagitis: Secondary | ICD-10-CM | POA: Diagnosis not present

## 2022-10-06 DIAGNOSIS — E782 Mixed hyperlipidemia: Secondary | ICD-10-CM | POA: Diagnosis not present

## 2022-11-05 DIAGNOSIS — K219 Gastro-esophageal reflux disease without esophagitis: Secondary | ICD-10-CM | POA: Diagnosis not present

## 2022-11-05 DIAGNOSIS — E782 Mixed hyperlipidemia: Secondary | ICD-10-CM | POA: Diagnosis not present

## 2022-11-05 DIAGNOSIS — I10 Essential (primary) hypertension: Secondary | ICD-10-CM | POA: Diagnosis not present

## 2022-11-05 DIAGNOSIS — N182 Chronic kidney disease, stage 2 (mild): Secondary | ICD-10-CM | POA: Diagnosis not present

## 2023-04-19 NOTE — Progress Notes (Unsigned)
No chief complaint on file.   HISTORY OF PRESENT ILLNESS:  04/19/23 ALL:  [name] returns for follow up for memory loss. He was last seen 04/2022 and doing well on memantine 10mg  BID and bupropion 150mg  daily. MMSE 23/30. Since,   04/14/2022 ALL: [name] is a 82 y.o. male here today for follow up for OSA and memory loss. He was last seen 08/2020. He continues memantine 10mg  BID and bupropion 150mg  daily. He presents with his daughter, [name], who aids in the history. He lives with her and her husband. He feels memory is stable. He is able to shower and dress. He needs assistance with meal prep and medication management. Gait is stable. He has had 2-3 falls over the past 1.5 years. No injuries. He uses his walker at all times. In wheelchair, today. He has a good appetite. Sometimes he forgets to swallow food before taking the next bite. He would not use thickeners for water but seems to be able to drink thin liquids without difficulty. He drinks at least 40 ounces of water daily. He is sleeping well. Mood is good. He does not drive.   He continues regular follow up with PCP. Is is taking atorvastatin and asa 81mg  daily. BP is normal. He was unable to tolerate CPAP.   09/02/2020 ALL: [name] is a 82 y.o. male here today for follow up for OSA and memory loss. He has continued Namenda 10mg  BID. He feels that he is doing fairly well. His daughter presents with him via Mychart and assists with visit. She agrees that he seems to be going quite well. Short term memory loss continues to be an issue but does not seem to have worsened significantly since last being seen. He is able to perform ADL's after being reminded. His daughter fixes meals and doses medicaitons. He does not drive. He is followed closely by PCP for stroke prevention. He has not used CPAP in about 2 years. He could not tolerate therapy. He is not as active as he used to be. His daughter is working on getting him more active.    History (copied from Dr Dohmeier's previous note) [name] meets me today in a wheelchair. His daughter reports he was admitted to hospital 06-20-2019,  After a fall. He fractured his " good ' right leg and dislocated the hip.  He dislocated his right ring finger in the another fall, while in hospital- and the injury was not discovered until 24 hours later, also had a head injury and was scheduled to be transferred to [facility]. His daughter intervened so that he stayed 48 hours and was stabilized.  He is now happy at [facility] and his med list is shrinking, he is getting few visits , due to need of social distancing.    REVIEW OF SYSTEMS: Out of a complete 14 system review of symptoms, the patient complains only of the following symptoms, memory loss, weakness and all other reviewed systems are negative.   ALLERGIES: No Known Allergies   HOME MEDICATIONS: Outpatient Medications Prior to Visit  Medication Sig Dispense Refill   amLODipine (NORVASC) 2.5 MG tablet Take 2.5 mg by mouth daily.     aspirin 81 MG tablet Take 81 mg by mouth every other day. Sunday, Tuesday, Thursday and Saturday     atorvastatin (LIPITOR) 10 MG tablet Take 10 mg by mouth daily.      B Complex Vitamins (VITAMIN B COMPLEX PO) Take 1 tablet by mouth daily.  buPROPion (WELLBUTRIN XL) 150 MG 24 hr tablet TAKE 1 TABLET(150 MG) BY MOUTH DAILY 90 tablet 3   Cholecalciferol (VITAMIN D3) 2000 units TABS Take 2,000 Units by mouth.     CRANBERRY EXTRACT PO Take 450 mg by mouth daily.     DULoxetine (CYMBALTA) 60 MG capsule Take 60 mg by mouth daily.     Ferrous Sulfate (IRON) 325 (65 Fe) MG TABS Take 1 tablet by mouth daily.     memantine (NAMENDA) 10 MG tablet Take 1 tablet (10 mg total) by mouth 2 (two) times daily. 180 tablet 3   Misc Natural Products (OSTEO BI-FLEX TRIPLE STRENGTH PO) Take 1 tablet by mouth daily.     MYRBETRIQ 50 MG TB24 tablet Take 50 mg by mouth daily.     oxybutynin (DITROPAN-XL) 10 MG 24 hr  tablet Take 10 mg by mouth daily.     tamsulosin (FLOMAX) 0.4 MG CAPS capsule Take 0.4 mg by mouth daily.     No facility-administered medications prior to visit.     PAST MEDICAL HISTORY: Past Medical History:  Diagnosis Date   CKD (chronic kidney disease), stage II    CVA (cerebral infarction)    Left thalamic, right-sided weakness wih aphagia, right leg brace   Dyslipidemia    Elevated PSA    biopsy negative in 2008   GERD (gastroesophageal reflux disease)    Hypertension    without medication since 2006   Major depression    Seasonal rhinitis      PAST SURGICAL HISTORY: Past Surgical History:  Procedure Laterality Date   HIP PINNING,CANNULATED Left 06/20/2019   Procedure: CANNULATED HIP PINNING;  Surgeon: Tarry Kos, MD;  Location: [facility] OR;  Service: Orthopedics;  Laterality: Left;   NASAL SEPTUM SURGERY     VASECTOMY       FAMILY HISTORY: Family History  Problem Relation Age of Onset   Colon cancer Brother      SOCIAL HISTORY: Social History   Socioeconomic History   Marital status: Widowed    Spouse name: [name]   Number of children: 2   Years of education: 12   Highest education level: Not on file  Occupational History   Not on file  Tobacco Use   Smoking status: Never   Smokeless tobacco: Never  Substance and Sexual Activity   Alcohol use: No   Drug use: No   Sexual activity: Not on file  Other Topics Concern   Not on file  Social History Narrative   Patient is married [name]) and lives at home with his wife.   Patient is disabled.    Patient has two adult children and two grandchildren.   Patient has a high school education.   Patient is left-handed.   Patient drinks one cup of coffee daily.            Social Determinants of Health   Financial Resource Strain: Not on file  Food Insecurity: Not on file  Transportation Needs: Not on file  Physical Activity: Not on file  Stress: Not on file  Social Connections: Not on file  Intimate  Partner Violence: Not on file     PHYSICAL EXAM  There were no vitals filed for this visit.  There is no height or weight on file to calculate BMI.  Generalized: Well developed, in no acute distress  Cardiology: normal rate and rhythm, no murmur auscultated  Respiratory: clear to auscultation bilaterally    Neurological examination  Mentation: Alert, he  is not oriented to time but is oriented to place and most history taking. Follows all commands speech and language fluent Cranial nerve II-XII: Pupils were equal round reactive to light. Extraocular movements were full, visual field were full on confrontational test. Facial sensation and strength were normal. Uvula tongue midline. Head turning and shoulder shrug  were normal and symmetric. Motor: The motor testing reveals 5 over 5 strength of all 4 extremities. Very slight weakness noted of right upper ext. Good symmetric motor tone is noted throughout.  Sensory: Sensory testing is intact to soft touch on all 4 extremities. No evidence of extinction is noted.  Coordination: Cerebellar testing reveals good finger-nose-finger on left, ataxia noted of right upper ext.  Gait and station: Gait not assessed as he is in a wheelchair and did not bring assistive device.    DIAGNOSTIC DATA (LABS, IMAGING, TESTING) - I reviewed patient records, labs, notes, testing and imaging myself where available.  Lab Results  Component Value Date   WBC 16.5 (H) 06/23/2019   HGB 11.1 (L) 06/23/2019   HCT 35.4 (L) 06/23/2019   MCV 92.2 06/23/2019   PLT 315 06/23/2019      Component Value Date/Time   NA 142 06/22/2019 0258   K 4.0 06/22/2019 0258   CL 107 06/22/2019 0258   CO2 25 06/22/2019 0258   GLUCOSE 122 (H) 06/22/2019 0258   BUN 20 06/22/2019 0258   CREATININE 1.42 (H) 06/22/2019 0258   CALCIUM 8.9 06/22/2019 0258   GFRNONAA 48 (L) 06/22/2019 0258   GFRAA 55 (L) 06/22/2019 0258   No results found for: "CHOL", "HDL", "LDLCALC", "LDLDIRECT",  "TRIG", "CHOLHDL" No results found for: "HGBA1C" No results found for: "VITAMINB12" No results found for: "TSH"     04/14/2022    9:52 AM 07/04/2019    4:04 PM 07/05/2018    1:19 PM  MMSE - Mini Mental State Exam  Orientation to time 1 3 1   Orientation to Place 5 5 5   Registration 3 3 3   Attention/ Calculation 3 1 4   Recall 3 3 3   Language- name 2 objects 2 2 2   Language- repeat 1 1 1   Language- follow 3 step command 3 3 3   Language- read & follow direction 1 1 1   Write a sentence 1 0 0  Copy design 0 0 1  Total score 23 22 24         09/13/2017   10:54 AM 05/31/2014   10:47 AM  Montreal Cognitive Assessment   Visuospatial/ Executive (0/5) 2 3  Naming (0/3) 3 3  Attention: Read list of digits (0/2) 2 2  Attention: Read list of letters (0/1) 1 1  Attention: Serial 7 subtraction starting at 100 (0/3) 2 3  Language: Repeat phrase (0/2) 1 2  Language : Fluency (0/1) 0 1  Abstraction (0/2) 2 2  Delayed Recall (0/5) 1 0  Orientation (0/6) 5 4  Total 19 21     ASSESSMENT AND PLAN  82 y.o. year old male  has a past medical history of CKD (chronic kidney disease), stage II, CVA (cerebral infarction), Dyslipidemia, Elevated PSA, GERD (gastroesophageal reflux disease), Hypertension, Major depression, and Seasonal rhinitis. here with    No diagnosis found.  [name] is doing well. Memory is stable and mood is good. We will continue memantine 10mg  twice daily and bupropion 150mg  daily. He was encouraged to continue regular physical and mental stimulation. Memory compensation strategies provided. Stroke prevention discussed and education provided. Healthy lifestyle  habits encouraged. He will follow up with PCP as directed. He will return to see me in 1 year, sooner if needed. He and his daughter verbalize understanding and agreement with this plan.   No orders of the defined types were placed in this encounter.    No orders of the defined types were placed in this  encounter.   I spent 30 minutes of face-to-face and non-face-to-face time with patient.  This included previsit chart review, lab review, study review, order entry, electronic health record documentation, patient education.   Shawnie Dapper, MSN, FNP-C 04/19/2023, 12:06 PM  [facility] [address] [place], [place] [address] [phone]

## 2023-04-19 NOTE — Patient Instructions (Signed)
Below is our plan:  We will continue donepezil 10mg and memantine 10mg daily  Please make sure you are staying well hydrated. I recommend 50-60 ounces daily. Well balanced diet and regular exercise encouraged. Consistent sleep schedule with 6-8 hours recommended.   Please continue follow up with care team as directed.   Follow up with me in 1 year   You may receive a survey regarding today's visit. I encourage you to leave honest feed back as I do use this information to improve patient care. Thank you for seeing me today!   Management of Memory Problems   There are some general things you can do to help manage your memory problems.  Your memory may not in fact recover, but by using techniques and strategies you will be able to manage your memory difficulties better.   1)  Establish a routine. Try to establish and then stick to a regular routine.  By doing this, you will get used to what to expect and you will reduce the need to rely on your memory.  Also, try to do things at the same time of day, such as taking your medication or checking your calendar first thing in the morning. Think about think that you can do as a part of a regular routine and make a list.  Then enter them into a daily planner to remind you.  This will help you establish a routine.   2)  Organize your environment. Organize your environment so that it is uncluttered.  Decrease visual stimulation.  Place everyday items such as keys or cell phone in the same place every day (ie.  Basket next to front door) Use post it notes with a brief message to yourself (ie. Turn off light, lock the door) Use labels to indicate where things go (ie. Which cupboards are for food, dishes, etc.) Keep a notepad and pen by the telephone to take messages   3)  Memory Aids A diary or journal/notebook/daily planner Making a list (shopping list, chore list, to do list that needs to be done) Using an alarm as a reminder (kitchen timer or cell  phone alarm) Using cell phone to store information (Notes, Calendar, Reminders) Calendar/White board placed in a prominent position Post-it notes   In order for memory aids to be useful, you need to have good habits.  It's no good remembering to make a note in your journal if you don't remember to look in it.  Try setting aside a certain time of day to look in journal.   4)  Improving mood and managing fatigue. There may be other factors that contribute to memory difficulties.  Factors, such as anxiety, depression and tiredness can affect memory. Regular gentle exercise can help improve your mood and give you more energy. Exercise: there are short videos created by the National Institute on Health specially for older adults: https://bit.ly/2I30q97.  Mediterranean diet: which emphasizes fruits, vegetables, whole grains, legumes, fish, and other seafood; unsaturated fats such as olive oils; and low amounts of red meat, eggs, and sweets. A variation of this, called MIND (Mediterranean-DASH Intervention for Neurodegenerative Delay) incorporates the DASH (Dietary Approaches to Stop Hypertension) diet, which has been shown to lower high blood pressure, a risk factor for Alzheimer's disease. More information at: https://www.nia.nih.gov/health/what-do-we-know-about-diet-and-prevention-alzheimers-disease.  Aerobic exercise that improve heart health is also good for the mind.  National Institute on Aging have short videos for exercises that you can do at home: www.nia.nih.gov/Go4Life Simple relaxation techniques may help relieve   symptoms of anxiety Try to get back to completing activities or hobbies you enjoyed doing in the past. Learn to pace yourself through activities to decrease fatigue. Find out about some local support groups where you can share experiences with others. Try and achieve 7-8 hours of sleep at night.   Resources for Family/Caregiver  Online caregiver support groups can be found at  alz.org or call Alzheimer's Association's 24/7 hotline: 800.272.3900. [facility] offers in-person, virtual support groups and individual counseling for both care partners and persons with memory loss. Call for more information at [phone].   Advanced care plan: there are two types of Power of Attorney: healthcare and durable. Healthcare POA is a designated person to make healthcare decisions on your behalf if you were too sick to make them yourself. This person can be selected and documented by your physician. Durable POA has to be set up with a lawyer who takes charge of your finances and estate if you were too sick or cognitively impaired to manage your finances accurately. You can find a local Elder Law lawyer here: https://www.naela.org/.  Check out www.planyourlifespan.org, which will help you plan before a crisis and decide who will take care of life considerations in a circumstance where you may not be able to speak for yourself.   Helpful books (available on Amazon or your local bookstore):  By Dr. Ed Shaw: Keeping Love Alive as Memories Fade: The 5 Love Languages and the Alzheimer's Journey Jun 07, 2015 The Dementia Care Partner's Workbook: A Guide for Understanding, Education, and Hope Paperback - February 04, 2018.  Both available for less than $15.   "Coping with behavior change in dementia: a family caregiver's guide" by Beth Spencer & Laurie White "A Caregiver's Guide to Dementia: Using Activities and Other Strategies to Prevent, Reduce and Manage Behavioral Symptoms" by Laura N. Gitlin and Catherine Piersol.  "Creating Moments of Joy for the Person with Alzheimer's or Dementia" 4th edition by Jolene Brackrey  Caregiver videos on common behaviors related to dementia: https://www.uclahealth.org/dementia/caregiver-education-videos  [facility] Caregiver Portal: free to sign up, links to local resources: [email or web address]/login  

## 2023-04-20 ENCOUNTER — Ambulatory Visit: Payer: Medicare Other | Admitting: Family Medicine

## 2023-04-20 ENCOUNTER — Encounter: Payer: Self-pay | Admitting: Family Medicine

## 2023-04-20 VITALS — BP 121/65 | HR 82 | Ht 70.0 in | Wt 146.5 lb

## 2023-04-20 DIAGNOSIS — F09 Unspecified mental disorder due to known physiological condition: Secondary | ICD-10-CM

## 2023-04-20 DIAGNOSIS — I639 Cerebral infarction, unspecified: Secondary | ICD-10-CM

## 2023-04-20 DIAGNOSIS — F0393 Unspecified dementia, unspecified severity, with mood disturbance: Secondary | ICD-10-CM

## 2023-04-20 MED ORDER — BUPROPION HCL ER (XL) 150 MG PO TB24: ORAL_TABLET | ORAL | 3 refills | Status: DC

## 2023-04-20 MED ORDER — MEMANTINE HCL 10 MG PO TABS: 10.0000 mg | ORAL_TABLET | Freq: Two times a day (BID) | ORAL | 3 refills | Status: DC

## 2023-05-29 ENCOUNTER — Inpatient Hospital Stay (HOSPITAL_COMMUNITY): Payer: Medicare Other

## 2023-05-29 ENCOUNTER — Encounter (HOSPITAL_COMMUNITY): Payer: Self-pay

## 2023-05-29 ENCOUNTER — Inpatient Hospital Stay (HOSPITAL_COMMUNITY)
Admission: EM | Admit: 2023-05-29 | Discharge: 2023-06-02 | DRG: 872 | Disposition: A | Discharge status: Home Health | Payer: Medicare Other | Attending: Internal Medicine | Admitting: Internal Medicine

## 2023-05-29 ENCOUNTER — Other Ambulatory Visit: Payer: Self-pay

## 2023-05-29 ENCOUNTER — Emergency Department (HOSPITAL_COMMUNITY): Payer: Medicare Other

## 2023-05-29 DIAGNOSIS — Z800 Family history of malignant neoplasm of digestive organs: Secondary | ICD-10-CM

## 2023-05-29 DIAGNOSIS — N3001 Acute cystitis with hematuria: Secondary | ICD-10-CM | POA: Diagnosis not present

## 2023-05-29 DIAGNOSIS — R402252 Coma scale, best verbal response, oriented, at arrival to emergency department: Secondary | ICD-10-CM | POA: Diagnosis not present

## 2023-05-29 DIAGNOSIS — G3184 Mild cognitive impairment, so stated: Secondary | ICD-10-CM | POA: Diagnosis present

## 2023-05-29 DIAGNOSIS — Z743 Need for continuous supervision: Secondary | ICD-10-CM | POA: Diagnosis not present

## 2023-05-29 DIAGNOSIS — Z79899 Other long term (current) drug therapy: Secondary | ICD-10-CM

## 2023-05-29 DIAGNOSIS — Z7982 Long term (current) use of aspirin: Secondary | ICD-10-CM

## 2023-05-29 DIAGNOSIS — S065XAA Traumatic subdural hemorrhage with loss of consciousness status unknown, initial encounter: Secondary | ICD-10-CM | POA: Diagnosis not present

## 2023-05-29 DIAGNOSIS — N179 Acute kidney failure, unspecified: Secondary | ICD-10-CM | POA: Diagnosis not present

## 2023-05-29 DIAGNOSIS — N1832 Chronic kidney disease, stage 3b: Secondary | ICD-10-CM | POA: Diagnosis not present

## 2023-05-29 DIAGNOSIS — Z66 Do not resuscitate: Secondary | ICD-10-CM | POA: Diagnosis not present

## 2023-05-29 DIAGNOSIS — N400 Enlarged prostate without lower urinary tract symptoms: Secondary | ICD-10-CM | POA: Diagnosis present

## 2023-05-29 DIAGNOSIS — I6201 Nontraumatic acute subdural hemorrhage: Secondary | ICD-10-CM | POA: Diagnosis not present

## 2023-05-29 DIAGNOSIS — N390 Urinary tract infection, site not specified: Secondary | ICD-10-CM

## 2023-05-29 DIAGNOSIS — I129 Hypertensive chronic kidney disease with stage 1 through stage 4 chronic kidney disease, or unspecified chronic kidney disease: Secondary | ICD-10-CM | POA: Diagnosis present

## 2023-05-29 DIAGNOSIS — K219 Gastro-esophageal reflux disease without esophagitis: Secondary | ICD-10-CM | POA: Diagnosis present

## 2023-05-29 DIAGNOSIS — R6520 Severe sepsis without septic shock: Secondary | ICD-10-CM | POA: Diagnosis present

## 2023-05-29 DIAGNOSIS — N319 Neuromuscular dysfunction of bladder, unspecified: Secondary | ICD-10-CM | POA: Diagnosis not present

## 2023-05-29 DIAGNOSIS — A4152 Sepsis due to Pseudomonas: Principal | ICD-10-CM | POA: Diagnosis present

## 2023-05-29 DIAGNOSIS — R109 Unspecified abdominal pain: Secondary | ICD-10-CM | POA: Diagnosis not present

## 2023-05-29 DIAGNOSIS — N200 Calculus of kidney: Secondary | ICD-10-CM | POA: Diagnosis present

## 2023-05-29 DIAGNOSIS — S0081XA Abrasion of other part of head, initial encounter: Secondary | ICD-10-CM | POA: Diagnosis not present

## 2023-05-29 DIAGNOSIS — I69351 Hemiplegia and hemiparesis following cerebral infarction affecting right dominant side: Secondary | ICD-10-CM | POA: Diagnosis not present

## 2023-05-29 DIAGNOSIS — F329 Major depressive disorder, single episode, unspecified: Secondary | ICD-10-CM | POA: Diagnosis present

## 2023-05-29 DIAGNOSIS — R509 Fever, unspecified: Secondary | ICD-10-CM | POA: Diagnosis not present

## 2023-05-29 DIAGNOSIS — G4733 Obstructive sleep apnea (adult) (pediatric): Secondary | ICD-10-CM | POA: Insufficient documentation

## 2023-05-29 DIAGNOSIS — A419 Sepsis, unspecified organism: Principal | ICD-10-CM

## 2023-05-29 DIAGNOSIS — F32A Depression, unspecified: Secondary | ICD-10-CM | POA: Diagnosis present

## 2023-05-29 DIAGNOSIS — R402142 Coma scale, eyes open, spontaneous, at arrival to emergency department: Secondary | ICD-10-CM | POA: Diagnosis present

## 2023-05-29 DIAGNOSIS — R402362 Coma scale, best motor response, obeys commands, at arrival to emergency department: Secondary | ICD-10-CM | POA: Diagnosis not present

## 2023-05-29 DIAGNOSIS — R220 Localized swelling, mass and lump, head: Secondary | ICD-10-CM | POA: Diagnosis not present

## 2023-05-29 DIAGNOSIS — Z23 Encounter for immunization: Secondary | ICD-10-CM

## 2023-05-29 DIAGNOSIS — S0990XA Unspecified injury of head, initial encounter: Secondary | ICD-10-CM | POA: Diagnosis not present

## 2023-05-29 DIAGNOSIS — W19XXXA Unspecified fall, initial encounter: Secondary | ICD-10-CM | POA: Diagnosis not present

## 2023-05-29 DIAGNOSIS — E872 Acidosis, unspecified: Secondary | ICD-10-CM | POA: Diagnosis not present

## 2023-05-29 DIAGNOSIS — I10 Essential (primary) hypertension: Secondary | ICD-10-CM | POA: Diagnosis present

## 2023-05-29 DIAGNOSIS — I6523 Occlusion and stenosis of bilateral carotid arteries: Secondary | ICD-10-CM | POA: Diagnosis not present

## 2023-05-29 DIAGNOSIS — E785 Hyperlipidemia, unspecified: Secondary | ICD-10-CM | POA: Diagnosis present

## 2023-05-29 DIAGNOSIS — Z8249 Family history of ischemic heart disease and other diseases of the circulatory system: Secondary | ICD-10-CM

## 2023-05-29 DIAGNOSIS — R000 Tachycardia, unspecified: Secondary | ICD-10-CM | POA: Diagnosis not present

## 2023-05-29 DIAGNOSIS — Z87440 Personal history of urinary (tract) infections: Secondary | ICD-10-CM

## 2023-05-29 DIAGNOSIS — I6200 Nontraumatic subdural hemorrhage, unspecified: Secondary | ICD-10-CM | POA: Diagnosis not present

## 2023-05-29 DIAGNOSIS — R319 Hematuria, unspecified: Secondary | ICD-10-CM | POA: Diagnosis not present

## 2023-05-29 LAB — COMPREHENSIVE METABOLIC PANEL
ALT: 23 U/L (ref 0–44)
AST: 36 U/L (ref 15–41)
Albumin: 3.4 g/dL — ABNORMAL LOW (ref 3.5–5.0)
Alkaline Phosphatase: 80 U/L (ref 38–126)
Anion gap: 9 (ref 5–15)
BUN: 18 mg/dL (ref 8–23)
CO2: 23 mmol/L (ref 22–32)
Calcium: 8.9 mg/dL (ref 8.9–10.3)
Chloride: 106 mmol/L (ref 98–111)
Creatinine, Ser: 1.62 mg/dL — ABNORMAL HIGH (ref 0.61–1.24)
GFR, Estimated: 43 mL/min — ABNORMAL LOW (ref >60)
Glucose, Bld: 108 mg/dL — ABNORMAL HIGH (ref 70–99)
Potassium: 4.2 mmol/L (ref 3.5–5.1)
Sodium: 138 mmol/L (ref 135–145)
Total Bilirubin: 1.6 mg/dL — ABNORMAL HIGH (ref 0.3–1.2)
Total Protein: 6.5 g/dL (ref 6.5–8.1)

## 2023-05-29 LAB — I-STAT CG4 LACTIC ACID, ED
Lactic Acid, Venous: 0.8 mmol/L (ref 0.5–1.9)
Lactic Acid, Venous: 2.6 mmol/L (ref 0.5–1.9)
Lactic Acid, Venous: 0.7 mmol/L (ref 0.5–1.9)

## 2023-05-29 LAB — URINALYSIS, W/ REFLEX TO CULTURE (INFECTION SUSPECTED)
Bacteria, UA: NONE SEEN
Bilirubin Urine: NEGATIVE
Glucose, UA: NEGATIVE mg/dL
Ketones, ur: NEGATIVE mg/dL
Nitrite: NEGATIVE
Protein, ur: NEGATIVE mg/dL
Specific Gravity, Urine: 1.011 (ref 1.005–1.030)
pH: 6 (ref 5.0–8.0)

## 2023-05-29 LAB — CBC WITH DIFFERENTIAL/PLATELET
Abs Immature Granulocytes: 0.29 10*3/uL — ABNORMAL HIGH (ref 0.00–0.07)
Basophils Absolute: 0.1 10*3/uL (ref 0.0–0.1)
Basophils Relative: 1 %
Eosinophils Absolute: 0 10*3/uL (ref 0.0–0.5)
Eosinophils Relative: 0 %
HCT: 44 % (ref 39.0–52.0)
Hemoglobin: 14.3 g/dL (ref 13.0–17.0)
Immature Granulocytes: 1 %
Lymphocytes Relative: 1 %
Lymphs Abs: 0.4 10*3/uL — ABNORMAL LOW (ref 0.7–4.0)
MCH: 31.9 pg (ref 26.0–34.0)
MCHC: 32.5 g/dL (ref 30.0–36.0)
MCV: 98.2 fL (ref 80.0–100.0)
Monocytes Absolute: 1.8 10*3/uL — ABNORMAL HIGH (ref 0.1–1.0)
Monocytes Relative: 7 %
Neutro Abs: 23.3 10*3/uL — ABNORMAL HIGH (ref 1.7–7.7)
Neutrophils Relative %: 90 %
Platelets: 272 10*3/uL (ref 150–400)
RBC: 4.48 MIL/uL (ref 4.22–5.81)
RDW: 14.6 % (ref 11.5–15.5)
WBC: 26 10*3/uL — ABNORMAL HIGH (ref 4.0–10.5)
nRBC: 0 % (ref 0.0–0.2)

## 2023-05-29 LAB — APTT: aPTT: 20 seconds — ABNORMAL LOW (ref 24–36)

## 2023-05-29 LAB — PROTIME-INR
INR: 1 (ref 0.8–1.2)
Prothrombin Time: 13.3 seconds (ref 11.4–15.2)

## 2023-05-29 MED ORDER — ENOXAPARIN SODIUM 40 MG/0.4ML IJ SOSY
40.0000 mg | PREFILLED_SYRINGE | INTRAMUSCULAR | Status: DC
  Administered 2023-05-29 – 2023-05-30 (×2): 40 mg via SUBCUTANEOUS
  Filled 2023-05-29 (×2): qty 0.4

## 2023-05-29 MED ORDER — MEMANTINE HCL 5 MG PO TABS
10.0000 mg | ORAL_TABLET | Freq: Two times a day (BID) | ORAL | Status: DC
  Administered 2023-05-29 – 2023-06-02 (×8): 10 mg via ORAL
  Filled 2023-05-29 (×8): qty 2

## 2023-05-29 MED ORDER — ASPIRIN 81 MG PO TBEC
81.0000 mg | DELAYED_RELEASE_TABLET | ORAL | Status: DC
  Administered 2023-05-30: 81 mg via ORAL
  Filled 2023-05-29: qty 1

## 2023-05-29 MED ORDER — TAMSULOSIN HCL 0.4 MG PO CAPS
0.4000 mg | ORAL_CAPSULE | Freq: Every day | ORAL | Status: DC
  Administered 2023-05-29 – 2023-06-01 (×4): 0.4 mg via ORAL
  Filled 2023-05-29 (×4): qty 1

## 2023-05-29 MED ORDER — MIRABEGRON ER 25 MG PO TB24
50.0000 mg | ORAL_TABLET | Freq: Every day | ORAL | Status: DC
  Administered 2023-05-30 – 2023-06-02 (×4): 50 mg via ORAL
  Filled 2023-05-29 (×4): qty 2

## 2023-05-29 MED ORDER — IOHEXOL 9 MG/ML PO SOLN
500.0000 mL | ORAL | Status: AC
  Administered 2023-05-29 (×2): 500 mL via ORAL

## 2023-05-29 MED ORDER — ACETAMINOPHEN 325 MG PO TABS: 650.0000 mg | ORAL_TABLET | Freq: Four times a day (QID) | ORAL | Status: DC | PRN

## 2023-05-29 MED ORDER — BUPROPION HCL ER (XL) 150 MG PO TB24
150.0000 mg | ORAL_TABLET | Freq: Every day | ORAL | Status: DC
  Administered 2023-05-29 – 2023-06-02 (×5): 150 mg via ORAL
  Filled 2023-05-29 (×5): qty 1

## 2023-05-29 MED ORDER — SODIUM CHLORIDE 0.9 % IV BOLUS
1000.0000 mL | Freq: Once | INTRAVENOUS | Status: AC
  Administered 2023-05-29: 1000 mL via INTRAVENOUS

## 2023-05-29 MED ORDER — ONDANSETRON HCL 4 MG PO TABS: 4.0000 mg | ORAL_TABLET | Freq: Four times a day (QID) | ORAL | Status: DC | PRN

## 2023-05-29 MED ORDER — SODIUM CHLORIDE 0.9 % IV SOLN
2.0000 g | INTRAVENOUS | Status: DC
  Administered 2023-05-30 – 2023-05-31 (×2): 2 g via INTRAVENOUS
  Filled 2023-05-29 (×2): qty 20

## 2023-05-29 MED ORDER — SODIUM CHLORIDE 0.9 % IV SOLN
1.0000 g | Freq: Once | INTRAVENOUS | Status: AC
  Administered 2023-05-29: 1 g via INTRAVENOUS
  Filled 2023-05-29: qty 10

## 2023-05-29 MED ORDER — ONDANSETRON HCL 4 MG/2ML IJ SOLN: 4.0000 mg | Freq: Four times a day (QID) | INTRAMUSCULAR | Status: DC | PRN

## 2023-05-29 MED ORDER — LACTATED RINGERS IV SOLN
150.0000 mL/h | INTRAVENOUS | Status: DC
  Administered 2023-05-29 – 2023-05-30 (×3): 150 mL/h via INTRAVENOUS

## 2023-05-29 MED ORDER — OXYBUTYNIN CHLORIDE ER 10 MG PO TB24
10.0000 mg | ORAL_TABLET | Freq: Every evening | ORAL | Status: DC
  Administered 2023-05-29 – 2023-06-01 (×4): 10 mg via ORAL
  Filled 2023-05-29 (×5): qty 1

## 2023-05-29 MED ORDER — INFLUENZA VAC A&B SURF ANT ADJ 0.5 ML IM SUSY
0.5000 mL | PREFILLED_SYRINGE | INTRAMUSCULAR | Status: AC
  Administered 2023-05-30: 0.5 mL via INTRAMUSCULAR
  Filled 2023-05-29: qty 0.5

## 2023-05-29 MED ORDER — ATORVASTATIN CALCIUM 10 MG PO TABS
10.0000 mg | ORAL_TABLET | Freq: Every day | ORAL | Status: DC
  Administered 2023-05-29 – 2023-06-02 (×5): 10 mg via ORAL
  Filled 2023-05-29 (×5): qty 1

## 2023-05-29 MED ORDER — DULOXETINE HCL 30 MG PO CPEP
60.0000 mg | ORAL_CAPSULE | Freq: Every day | ORAL | Status: DC
  Administered 2023-05-29 – 2023-06-02 (×5): 60 mg via ORAL
  Filled 2023-05-29 (×5): qty 2

## 2023-05-29 MED ORDER — ACETAMINOPHEN 325 MG PO TABS
650.0000 mg | ORAL_TABLET | Freq: Once | ORAL | Status: AC
  Administered 2023-05-29: 650 mg via ORAL
  Filled 2023-05-29: qty 2

## 2023-05-29 MED ORDER — ACETAMINOPHEN 650 MG RE SUPP: 650.0000 mg | Freq: Four times a day (QID) | RECTAL | Status: DC | PRN

## 2023-05-29 MED ORDER — SODIUM CHLORIDE 0.9 % IV BOLUS
500.0000 mL | Freq: Once | INTRAVENOUS | Status: AC
  Administered 2023-05-29: 500 mL via INTRAVENOUS

## 2023-05-29 NOTE — Assessment & Plan Note (Addendum)
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

## 2023-05-29 NOTE — ED Notes (Signed)
ED TO INPATIENT HANDOFF REPORT  ED Nurse Name and Phone #: Park Meo Name/Age/Gender [name] 82 y.o. male Room/Bed: WA24/WA24  Code Status   Code Status: Do not attempt resuscitation (DNR) PRE-ARREST INTERVENTIONS DESIRED  Home/SNF/Other Home Patient oriented to: self, place, time, and situation Is this baseline? Yes   Triage Complete: Triage complete  Chief Complaint Sepsis secondary to UTI (HCC) [A41.9, N39.0]  Triage Note Pt BIB EMS from home c/o hematuria since this morning. Pt reports lower back pain. Hx dementia.    Allergies No Known Allergies  Level of Care/Admitting Diagnosis ED Disposition     ED Disposition  Admit   Condition  --   Comment  Hospital Area: [facility] [[id number]]  Level of Care: Progressive [102]  Admit to Progressive based on following criteria: MULTISYSTEM THREATS such as stable sepsis, metabolic/electrolyte imbalance with or without encephalopathy that is responding to early treatment.  May admit patient to [facility] or [facility] if equivalent level of care is available:: Yes  Covid Evaluation: Confirmed COVID Negative  Diagnosis: Sepsis secondary to UTI [facility]) [[id number]]  Admitting Physician: Andris Baumann [[id number]]  Attending Physician: Andris Baumann [[id number]]  Certification:: I certify this patient will need inpatient services for at least 2 midnights  Expected Medical Readiness: 05/31/2023          B Medical/Surgery History Past Medical History:  Diagnosis Date   CKD (chronic kidney disease), stage II    CVA (cerebral infarction)    Left thalamic, right-sided weakness wih aphagia, right leg brace   Dyslipidemia    Elevated PSA    biopsy negative in 2008   GERD (gastroesophageal reflux disease)    Hypertension    without medication since 2006   Major depression    Seasonal rhinitis    Past Surgical History:  Procedure Laterality Date   HIP PINNING,CANNULATED Left 06/20/2019    Procedure: CANNULATED HIP PINNING;  Surgeon: Tarry Kos, MD;  Location: [facility] OR;  Service: Orthopedics;  Laterality: Left;   NASAL SEPTUM SURGERY     VASECTOMY       A IV Location/Drains/Wounds Patient Lines/Drains/Airways Status     Active Line/Drains/Airways     Name Placement date Placement time Site Days   Peripheral IV 05/29/23 20 G Right;Posterior Hand 05/29/23  1445  Hand  less than 1   Incision (Closed) 06/20/19 Hip Left 06/20/19  1802  -- 1439            Intake/Output Last 24 hours  Intake/Output Summary (Last 24 hours) at 05/29/2023 1725 Last data filed at 05/29/2023 1629 Gross per 24 hour  Intake 1100 ml  Output 250 ml  Net 850 ml    Labs/Imaging Results for orders placed or performed during the hospital encounter of 05/29/23 (from the past 48 hour(s))  Comprehensive metabolic panel     Status: Abnormal   Collection Time: 05/29/23  2:30 PM  Result Value Ref Range   Sodium 138 135 - 145 mmol/L   Potassium 4.2 3.5 - 5.1 mmol/L    Comment: HEMOLYSIS AT THIS LEVEL MAY AFFECT RESULT   Chloride 106 98 - 111 mmol/L   CO2 23 22 - 32 mmol/L   Glucose, Bld 108 (H) 70 - 99 mg/dL    Comment: Glucose reference range applies only to samples taken after fasting for at least 8 hours.   BUN 18 8 - 23 mg/dL   Creatinine, Ser 2.95 (H) 0.61 - 1.24  mg/dL   Calcium 8.9 8.9 - 25.3 mg/dL   Total Protein 6.5 6.5 - 8.1 g/dL   Albumin 3.4 (L) 3.5 - 5.0 g/dL   AST 36 15 - 41 U/L    Comment: HEMOLYSIS AT THIS LEVEL MAY AFFECT RESULT   ALT 23 0 - 44 U/L    Comment: HEMOLYSIS AT THIS LEVEL MAY AFFECT RESULT   Alkaline Phosphatase 80 38 - 126 U/L   Total Bilirubin 1.6 (H) 0.3 - 1.2 mg/dL    Comment: HEMOLYSIS AT THIS LEVEL MAY AFFECT RESULT   GFR, Estimated 43 (L) >60 mL/min    Comment: (NOTE) Calculated using the CKD-EPI Creatinine Equation (2021)    Anion gap 9 5 - 15    Comment: Performed at [facility], [address] [place], [place] [address]  CBC  with Differential     Status: Abnormal   Collection Time: 05/29/23  2:30 PM  Result Value Ref Range   WBC 26.0 (H) 4.0 - 10.5 K/uL   RBC 4.48 4.22 - 5.81 MIL/uL   Hemoglobin 14.3 13.0 - 17.0 g/dL   HCT 34.7 42.5 - 95.6 %   MCV 98.2 80.0 - 100.0 fL   MCH 31.9 26.0 - 34.0 pg   MCHC 32.5 30.0 - 36.0 g/dL   RDW 38.7 56.4 - 33.2 %   Platelets 272 150 - 400 K/uL   nRBC 0.0 0.0 - 0.2 %   Neutrophils Relative % 90 %   Neutro Abs 23.3 (H) 1.7 - 7.7 K/uL   Lymphocytes Relative 1 %   Lymphs Abs 0.4 (L) 0.7 - 4.0 K/uL   Monocytes Relative 7 %   Monocytes Absolute 1.8 (H) 0.1 - 1.0 K/uL   Eosinophils Relative 0 %   Eosinophils Absolute 0.0 0.0 - 0.5 K/uL   Basophils Relative 1 %   Basophils Absolute 0.1 0.0 - 0.1 K/uL   Immature Granulocytes 1 %   Abs Immature Granulocytes 0.29 (H) 0.00 - 0.07 K/uL    Comment: Performed at [facility], [address], [place], [place] [address]  Protime-INR     Status: None   Collection Time: 05/29/23  2:30 PM  Result Value Ref Range   Prothrombin Time 13.3 11.4 - 15.2 seconds   INR 1.0 0.8 - 1.2    Comment: (NOTE) INR goal varies based on device and disease states. Performed at [facility], [address] [place], [place] [address]   APTT     Status: Abnormal   Collection Time: 05/29/23  2:30 PM  Result Value Ref Range   aPTT 20 (L) 24 - 36 seconds    Comment: Performed at [facility], [address] [place], [place] [address]  I-Stat Lactic Acid, ED     Status: Abnormal   Collection Time: 05/29/23  2:35 PM  Result Value Ref Range   Lactic Acid, Venous 2.6 (HH) 0.5 - 1.9 mmol/L  I-Stat CG4 Lactic Acid, ED     Status: None   Collection Time: 05/29/23  3:13 PM  Result Value Ref Range   Lactic Acid, Venous 0.8 0.5 - 1.9 mmol/L  Urinalysis, w/ Reflex to Culture (Infection Suspected) -Urine, Clean Catch     Status: Abnormal   Collection Time: 05/29/23  3:51 PM  Result Value Ref Range    Specimen Source URINE, CLEAN CATCH    Color, Urine YELLOW YELLOW   APPearance HAZY (A) CLEAR   Specific Gravity, Urine 1.011 1.005 -  1.030   pH 6.0 5.0 - 8.0   Glucose, UA NEGATIVE NEGATIVE mg/dL   Hgb urine dipstick MODERATE (A) NEGATIVE   Bilirubin Urine NEGATIVE NEGATIVE   Ketones, ur NEGATIVE NEGATIVE mg/dL   Protein, ur NEGATIVE NEGATIVE mg/dL   Nitrite NEGATIVE NEGATIVE   Leukocytes,Ua MODERATE (A) NEGATIVE   RBC / HPF 6-10 0 - 5 RBC/hpf   WBC, UA 21-50 0 - 5 WBC/hpf    Comment:        Reflex urine culture not performed if WBC <=10, OR if Squamous epithelial cells >5. If Squamous epithelial cells >5 suggest recollection.    Bacteria, UA NONE SEEN NONE SEEN   Squamous Epithelial / HPF 0-5 0 - 5 /HPF   Mucus PRESENT    Hyaline Casts, UA PRESENT     Comment: Performed at [facility], [address] [place], [place] [address]  I-Stat Lactic Acid, ED     Status: None   Collection Time: 05/29/23  4:57 PM  Result Value Ref Range   Lactic Acid, Venous 0.7 0.5 - 1.9 mmol/L   DG Chest Port 1 View  Result Date: 05/29/2023 CLINICAL DATA:  Fever.  History of dementia. EXAM: PORTABLE CHEST 1 VIEW COMPARISON:  06/20/2019 FINDINGS: Lungs are adequately inflated and otherwise clear. Cardiomediastinal silhouette and remainder of the exam is unchanged. IMPRESSION: No active disease. Electronically Signed   By: Elberta Fortis M.D.   On: 05/29/2023 14:59    Pending Labs Unresulted Labs (From admission, onward)     Start     Ordered   05/30/23 0500  Basic metabolic panel  Tomorrow morning,   R        05/29/23 1716   05/29/23 1551  Urine Culture  Once,   R        05/29/23 1551   05/29/23 1406  Blood Culture (routine x 2)  (Undifferentiated presentation (screening labs and basic nursing orders))  BLOOD CULTURE X 2,   STAT      05/29/23 1406            Vitals/Pain Today's Vitals   05/29/23 1530 05/29/23 1615 05/29/23 1630 05/29/23 1641  BP: (!) 104/59 93/62  100/61   Pulse: 80 72 72   Resp: 15 18 17    Temp:      TempSrc:      SpO2: 96% 99% 97%   Weight:      Height:      PainSc:    0-No pain    Isolation Precautions No active isolations  Medications Medications  aspirin tablet 81 mg (has no administration in time range)  atorvastatin (LIPITOR) tablet 10 mg (has no administration in time range)  buPROPion (WELLBUTRIN XL) 24 hr tablet 150 mg (has no administration in time range)  DULoxetine (CYMBALTA) DR capsule 60 mg (has no administration in time range)  memantine (NAMENDA) tablet 10 mg (has no administration in time range)  mirabegron ER (MYRBETRIQ) tablet 50 mg (has no administration in time range)  oxybutynin (DITROPAN-XL) 24 hr tablet 10 mg (has no administration in time range)  tamsulosin (FLOMAX) capsule 0.4 mg (has no administration in time range)  lactated ringers infusion (has no administration in time range)  enoxaparin (LOVENOX) injection 40 mg (has no administration in time range)  cefTRIAXone (ROCEPHIN) 2 g in sodium chloride 0.9 % 100 mL IVPB (has no administration in time range)  acetaminophen (TYLENOL) tablet 650 mg (has no administration in time range)    Or  acetaminophen (TYLENOL) suppository 650 mg (has no administration in time range)  ondansetron (ZOFRAN) tablet 4 mg (has no administration in time range)    Or  ondansetron (ZOFRAN) injection 4 mg (has no administration in time range)  sodium chloride 0.9 % bolus 1,000 mL (0 mLs Intravenous Stopped 05/29/23 1629)  acetaminophen (TYLENOL) tablet 650 mg (650 mg Oral Given 05/29/23 1515)  cefTRIAXone (ROCEPHIN) 1 g in sodium chloride 0.9 % 100 mL IVPB (0 g Intravenous Stopped 05/29/23 1629)  sodium chloride 0.9 % bolus 500 mL (500 mLs Intravenous New Bag/Given 05/29/23 1651)    Mobility non-ambulatory     Focused Assessments Cardiac Assessment Handoff:    No results found for: "CKTOTAL", "CKMB", "CKMBINDEX", "TROPONINI" No results found for: "DDIMER" Does  the Patient currently have chest pain? No    R Recommendations: See Admitting Provider Note  Report given to:   Additional Notes:

## 2023-05-29 NOTE — Assessment & Plan Note (Signed)
CPAP nightly

## 2023-05-29 NOTE — Assessment & Plan Note (Addendum)
Gross hematuria Sepsis criteria include fever, tachycardia, borderline blood pressure, leukocytosis with lactic acidosis and UTI Will get CT abdomen and pelvis Continue sepsis fluids Continue Rocephin Follow cultures

## 2023-05-29 NOTE — ED Notes (Signed)
In and Out cath performed on patient, received 250 mL

## 2023-05-29 NOTE — Assessment & Plan Note (Signed)
Patient intolerant of CPAP.  Has not used it in 2 years

## 2023-05-29 NOTE — Assessment & Plan Note (Signed)
Continue aspirin and atorvastatin. ?

## 2023-05-29 NOTE — Assessment & Plan Note (Signed)
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

## 2023-05-29 NOTE — Assessment & Plan Note (Addendum)
Appear chronic .  Unknown whether acute component IV hydration and monitor for improvement Avoid nephrotoxins

## 2023-05-29 NOTE — ED Triage Notes (Signed)
Pt BIB EMS from home c/o hematuria since this morning. Pt reports lower back pain. Hx dementia.

## 2023-05-29 NOTE — Assessment & Plan Note (Addendum)
Hold amlodipine due to soft blood pressure on arrival

## 2023-05-29 NOTE — Assessment & Plan Note (Addendum)
Mild cognitive impairment Continue Wellbutrin, duloxetine and memantine Follows with neurology outpatient Delirium precautions

## 2023-05-29 NOTE — ED Provider Notes (Signed)
[place] EMERGENCY DEPARTMENT AT [facility] Provider Note   CSN: [id number] Arrival date & time: 05/29/23  1211     History  Chief Complaint  Patient presents with   Hematuria    [name] is a 82 y.o. male.  HPI 82 year old male with a history of a prior stroke affecting the right side of his body presents with weakness and hematuria.  History is primarily from family.  The patient was noted to have blood in his underwear/depends this morning.  He had a 99.5 temperature when he arrived here but no obvious fever at home.  However he has seemed weaker than normal and was unable to walk even with his walker.  He has not had a cough, abdominal pain or altered mental status compared to baseline.  He has been complaining of some mid low back pain. Has a history of prior UTIs.  Home Medications Prior to Admission medications   Medication Sig Start Date End Date Taking? Authorizing Provider  amLODipine (NORVASC) 5 MG tablet Take 5 mg by mouth in the morning.   Yes [provider]  atorvastatin (LIPITOR) 10 MG tablet Take 10 mg by mouth every evening. 04/02/18  Yes [provider]  B Complex Vitamins (VITAMIN B COMPLEX PO) Take 1 tablet by mouth daily with breakfast.   Yes [provider]  BAYER LOW DOSE 81 MG tablet Take 81 mg by mouth daily. Swallow whole.   Yes [provider]  buPROPion (WELLBUTRIN XL) 150 MG 24 hr tablet TAKE 1 TABLET(150 MG) BY MOUTH DAILY Patient taking differently: Take 150 mg by mouth in the morning. 04/20/23  Yes Lomax, Amy, NP  Cholecalciferol (VITAMIN D3) 1000 units CAPS Take 1,000 Units by mouth daily with breakfast.   Yes [provider]  DULoxetine (CYMBALTA) 60 MG capsule Take 60 mg by mouth every evening.   Yes [provider]  memantine (NAMENDA) 10 MG tablet Take 1 tablet (10 mg total) by mouth 2 (two) times daily. 04/20/23  Yes Lomax, Amy, NP  MYRBETRIQ 50 MG TB24 tablet Take 50 mg by  mouth daily. 04/09/22  Yes [provider]  oxybutynin (DITROPAN-XL) 10 MG 24 hr tablet Take 10 mg by mouth every evening. 04/09/22  Yes [provider]  tamsulosin (FLOMAX) 0.4 MG CAPS capsule Take 0.4 mg by mouth every evening. 04/09/22  Yes [provider]      Allergies    Patient has no known allergies.    Review of Systems   Review of Systems  Constitutional:  Negative for fever.  Respiratory:  Negative for cough and shortness of breath.   Cardiovascular:  Negative for chest pain.  Gastrointestinal:  Negative for abdominal pain.  Genitourinary:  Positive for hematuria.  Musculoskeletal:  Positive for back pain.  Neurological:  Positive for weakness.    Physical Exam Updated Vital Signs BP 116/65 (BP Location: Left Arm)   Pulse 66   Temp 98 F (36.7 C) (Oral)   Resp 18   Ht 5\' 10"  (1.778 m)   Wt 66.5 kg   SpO2 97%   BMI 21.04 kg/m  Physical Exam Vitals and nursing note reviewed.  Constitutional:      Appearance: He is well-developed. He is not ill-appearing or diaphoretic.  HENT:     Head: Normocephalic and atraumatic.  Cardiovascular:     Rate and Rhythm: Regular rhythm. Tachycardia present.     Heart sounds: Normal heart sounds.     Comments: HR ~  100 Pulmonary:     Effort: Pulmonary effort is normal.     Breath sounds: Normal breath sounds.  Abdominal:     General: There is no distension.     Palpations: Abdomen is soft.     Tenderness: There is no abdominal tenderness. There is no right CVA tenderness or left CVA tenderness.  Musculoskeletal:     Thoracic back: No tenderness.     Lumbar back: No tenderness.  Skin:    General: Skin is warm and dry.  Neurological:     Mental Status: He is alert.     Comments: Chronic right sided weakness     ED Results / Procedures / Treatments   Labs (all labs ordered are listed, but only abnormal results are displayed) Labs Reviewed  URINALYSIS, W/ REFLEX TO CULTURE (INFECTION SUSPECTED) -  Abnormal; Notable for the following components:      Result Value   APPearance HAZY (*)    Hgb urine dipstick MODERATE (*)    Leukocytes,Ua MODERATE (*)    All other components within normal limits  COMPREHENSIVE METABOLIC PANEL - Abnormal; Notable for the following components:   Glucose, Bld 108 (*)    Creatinine, Ser 1.62 (*)    Albumin 3.4 (*)    Total Bilirubin 1.6 (*)    GFR, Estimated 43 (*)    All other components within normal limits  CBC WITH DIFFERENTIAL/PLATELET - Abnormal; Notable for the following components:   WBC 26.0 (*)    Neutro Abs 23.3 (*)    Lymphs Abs 0.4 (*)    Monocytes Absolute 1.8 (*)    Abs Immature Granulocytes 0.29 (*)    All other components within normal limits  APTT - Abnormal; Notable for the following components:   aPTT 20 (*)    All other components within normal limits  I-STAT CG4 LACTIC ACID, ED - Abnormal; Notable for the following components:   Lactic Acid, Venous 2.6 (*)    All other components within normal limits  CULTURE, BLOOD (ROUTINE X 2)  CULTURE, BLOOD (ROUTINE X 2)  URINE CULTURE  PROTIME-INR  BASIC METABOLIC PANEL  I-STAT CG4 LACTIC ACID, ED  I-STAT CG4 LACTIC ACID, ED    EKG EKG Interpretation Date/Time:  Sunday May 29 2023 14:19:21 EDT Ventricular Rate:  88 PR Interval:  169 QRS Duration:  91 QT Interval:  345 QTC Calculation: 418 R Axis:   78  Text Interpretation: Sinus rhythm Borderline repolarization abnormality similar to 2020 Confirmed by Pricilla Loveless [id number]) on 05/29/2023 3:09:09 PM  Radiology DG Chest Port 1 View  Result Date: 05/29/2023 CLINICAL DATA:  Fever.  History of dementia. EXAM: PORTABLE CHEST 1 VIEW COMPARISON:  06/20/2019 FINDINGS: Lungs are adequately inflated and otherwise clear. Cardiomediastinal silhouette and remainder of the exam is unchanged. IMPRESSION: No active disease. Electronically Signed   By: Elberta Fortis M.D.   On: 05/29/2023 14:59    Procedures .Critical  Care  Performed by: Pricilla Loveless, MD Authorized by: Pricilla Loveless, MD   Critical care provider statement:    Critical care time (minutes):  35   Critical care time was exclusive of:  Separately billable procedures and treating other patients   Critical care was necessary to treat or prevent imminent or life-threatening deterioration of the following conditions:  Sepsis   Critical care was time spent personally by me on the following activities:  Development of treatment plan with patient or surrogate, discussions with consultants, evaluation of patient's response to treatment, examination of  patient, ordering and review of laboratory studies, ordering and review of radiographic studies, ordering and performing treatments and interventions, pulse oximetry, re-evaluation of patient's condition and review of old charts     Medications Ordered in ED Medications  aspirin EC tablet 81 mg (has no administration in time range)  atorvastatin (LIPITOR) tablet 10 mg (has no administration in time range)  buPROPion (WELLBUTRIN XL) 24 hr tablet 150 mg (has no administration in time range)  DULoxetine (CYMBALTA) DR capsule 60 mg (has no administration in time range)  memantine (NAMENDA) tablet 10 mg (has no administration in time range)  mirabegron ER (MYRBETRIQ) tablet 50 mg (has no administration in time range)  oxybutynin (DITROPAN-XL) 24 hr tablet 10 mg (has no administration in time range)  tamsulosin (FLOMAX) capsule 0.4 mg (has no administration in time range)  lactated ringers infusion ( Intravenous Infusion Verify 05/29/23 1840)  enoxaparin (LOVENOX) injection 40 mg (has no administration in time range)  cefTRIAXone (ROCEPHIN) 2 g in sodium chloride 0.9 % 100 mL IVPB (has no administration in time range)  acetaminophen (TYLENOL) tablet 650 mg (has no administration in time range)    Or  acetaminophen (TYLENOL) suppository 650 mg (has no administration in time range)  ondansetron (ZOFRAN)  tablet 4 mg (has no administration in time range)    Or  ondansetron (ZOFRAN) injection 4 mg (has no administration in time range)  iohexol (OMNIPAQUE) 9 MG/ML oral solution 500 mL (has no administration in time range)  influenza vaccine adjuvanted (FLUAD) injection 0.5 mL (has no administration in time range)  sodium chloride 0.9 % bolus 1,000 mL (0 mLs Intravenous Stopped 05/29/23 1629)  acetaminophen (TYLENOL) tablet 650 mg (650 mg Oral Given 05/29/23 1515)  cefTRIAXone (ROCEPHIN) 1 g in sodium chloride 0.9 % 100 mL IVPB (0 g Intravenous Stopped 05/29/23 1629)  sodium chloride 0.9 % bolus 500 mL (500 mLs Intravenous New Bag/Given 05/29/23 1651)    ED Course/ Medical Decision Making/ A&P                                 Medical Decision Making Amount and/or Complexity of Data Reviewed Labs: ordered.    Details: Significant leukocytosis to 26.  Lactate was 2.6 and then cleared.  Urine consistent with UTI. Radiology: ordered and independent interpretation performed.    Details: No pneumonia ECG/medicine tests: ordered and independent interpretation performed.    Details: No acute ischemia  Risk OTC drugs. Decision regarding hospitalization.   Patient presents with low-grade fever and UTI.  Has had some soft blood pressures but no outright hypotension.  Found to have a significant leukocytosis and an initial lactate over 2.  Treated per sepsis protocol with IV fluids and IV Rocephin.  Fortunately his lactate is clearing and his soft blood pressures are improving with some fluids.  He has not altered or in distress otherwise.  I think he will need admission for IV antibiotics and supportive care for UTI.  Discussed with Dr. Para March for admission.        Final Clinical Impression(s) / ED Diagnoses Final diagnoses:  Sepsis secondary to UTI [facility])    Rx / DC Orders ED Discharge Orders     None         Pricilla Loveless, MD 05/29/23 [phone]

## 2023-05-29 NOTE — H&P (Addendum)
History and Physical    Patient: [name] WUJ:[id number] DOB: 01/25/1941 DOA: 05/29/2023 DOS: the patient was seen and examined on 05/29/2023 PCP: [name], MD  Patient coming from: Home  Chief Complaint:  Chief Complaint  Patient presents with   Hematuria    HPI: [name] is a 82 y.o. male with medical history significant for memory loss, hypertension, dyslipidemia, CVA with residual right hemiplegia, dysarthria , OSA not on CPAP, BPH, who was brought to the ED after he awoke with weakness and hematuria that started on the morning of arrival.  History is provided by son and daughter(POA) who are his caregivers and stated that patient was in his usual state of health the day prior.  Today when they were cleaning him up they noticed his urine had a pink tinge to it and if saw specks of blood on his underwear and thigh.  He had no initial complaints of pain however when they were moving him from the bed he briefly complained of back pain which resolved with repositioning.  He has had no fever, chills, diarrhea, cough or shortness of breath.   ED course and data review: Tmax 100.3 and tachycardic to 114 on arrival with soft BP of 104/59 Labs significant for WBC 26,000 with lactic acid 2.6-->0.8 and urinalysis showing moderate leuks. CMP showed creatinine of 1.62 with no recent baseline (1.303 years prior EKG, personally viewed and interpreted shows sinus rhythm at 88 with no acute ST-T wave changes Chest x-ray no active disease Patient given an IV fluid bolus and started on ceftriaxone. Hospitalist consulted for admission.     Past Medical History:  Diagnosis Date   CKD (chronic kidney disease), stage II    CVA (cerebral infarction)    Left thalamic, right-sided weakness wih aphagia, right leg brace   Dyslipidemia    Elevated PSA    biopsy negative in 2008   GERD (gastroesophageal reflux disease)    Hypertension    without medication since 2006   Major depression     Seasonal rhinitis    Past Surgical History:  Procedure Laterality Date   HIP PINNING,CANNULATED Left 06/20/2019   Procedure: CANNULATED HIP PINNING;  Surgeon: Tarry Kos, MD;  Location: [facility] OR;  Service: Orthopedics;  Laterality: Left;   NASAL SEPTUM SURGERY     VASECTOMY     Social History:  reports that he has never smoked. He has never used smokeless tobacco. He reports that he does not drink alcohol and does not use drugs.  No Known Allergies  Family History  Problem Relation Age of Onset   Colon cancer Brother     Prior to Admission medications   Medication Sig Start Date End Date Taking? Authorizing Provider  amLODipine (NORVASC) 2.5 MG tablet Take 2.5 mg by mouth daily. 09/24/16   [provider]  aspirin 81 MG tablet Take 81 mg by mouth every other day. Sunday, Tuesday, Thursday and Saturday    [provider]  atorvastatin (LIPITOR) 10 MG tablet Take 10 mg by mouth daily.  04/02/18   [provider]  B Complex Vitamins (VITAMIN B COMPLEX PO) Take 1 tablet by mouth daily.     [provider]  buPROPion (WELLBUTRIN XL) 150 MG 24 hr tablet TAKE 1 TABLET(150 MG) BY MOUTH DAILY 04/20/23   Lomax, Amy, NP  Cholecalciferol (VITAMIN D3) 2000 units TABS Take 2,000 Units by mouth.    [provider]  DULoxetine (CYMBALTA) 60 MG capsule Take 60 mg by  mouth daily.    [provider]  memantine (NAMENDA) 10 MG tablet Take 1 tablet (10 mg total) by mouth 2 (two) times daily. 04/20/23   Lomax, Amy, NP  Misc Natural Products (OSTEO BI-FLEX TRIPLE STRENGTH PO) Take 1 tablet by mouth daily.    [provider]  MYRBETRIQ 50 MG TB24 tablet Take 50 mg by mouth daily. 04/09/22   [provider]  oxybutynin (DITROPAN-XL) 10 MG 24 hr tablet Take 10 mg by mouth daily. 04/09/22   [provider]  tamsulosin (FLOMAX) 0.4 MG CAPS capsule Take 0.4 mg by mouth daily. 04/09/22   [provider]    Physical Exam: Vitals:    05/29/23 1414 05/29/23 1530 05/29/23 1615 05/29/23 1630  BP:  (!) 104/59 93/62 100/61  Pulse:  80 72 72  Resp:  15 18 17   Temp: 100.3 F (37.9 C)     TempSrc: Rectal     SpO2:  96% 99% 97%  Weight:      Height:       Physical Exam Vitals and nursing note reviewed.  Constitutional:      General: He is sleeping. He is not in acute distress. HENT:     Head: Normocephalic and atraumatic.  Cardiovascular:     Rate and Rhythm: Normal rate and regular rhythm.     Heart sounds: Normal heart sounds.  Pulmonary:     Effort: Pulmonary effort is normal.     Breath sounds: Normal breath sounds.  Abdominal:     Palpations: Abdomen is soft.     Tenderness: There is no abdominal tenderness.  Neurological:     Mental Status: He is easily aroused. Mental status is at baseline.     Labs on Admission: I have personally reviewed following labs and imaging studies  CBC: Recent Labs  Lab 05/29/23 1430  WBC 26.0*  NEUTROABS 23.3*  HGB 14.3  HCT 44.0  MCV 98.2  PLT 272   Basic Metabolic Panel: Recent Labs  Lab 05/29/23 1430  NA 138  K 4.2  CL 106  CO2 23  GLUCOSE 108*  BUN 18  CREATININE 1.62*  CALCIUM 8.9   GFR: Estimated Creatinine Clearance: 34.2 mL/min (A) (by C-G formula based on SCr of 1.62 mg/dL (H)). Liver Function Tests: Recent Labs  Lab 05/29/23 1430  AST 36  ALT 23  ALKPHOS 80  BILITOT 1.6*  PROT 6.5  ALBUMIN 3.4*   No results for input(s): "LIPASE", "AMYLASE" in the last 168 hours. No results for input(s): "AMMONIA" in the last 168 hours. Coagulation Profile: Recent Labs  Lab 05/29/23 1430  INR 1.0   Cardiac Enzymes: No results for input(s): "CKTOTAL", "CKMB", "CKMBINDEX", "TROPONINI" in the last 168 hours. BNP (last 3 results) No results for input(s): "PROBNP" in the last 8760 hours. HbA1C: No results for input(s): "HGBA1C" in the last 72 hours. CBG: No results for input(s): "GLUCAP" in the last 168 hours. Lipid Profile: No results for  input(s): "CHOL", "HDL", "LDLCALC", "TRIG", "CHOLHDL", "LDLDIRECT" in the last 72 hours. Thyroid Function Tests: No results for input(s): "TSH", "T4TOTAL", "FREET4", "T3FREE", "THYROIDAB" in the last 72 hours. Anemia Panel: No results for input(s): "VITAMINB12", "FOLATE", "FERRITIN", "TIBC", "IRON", "RETICCTPCT" in the last 72 hours. Urine analysis:    Component Value Date/Time   COLORURINE YELLOW 05/29/2023 1551   APPEARANCEUR HAZY (A) 05/29/2023 1551   LABSPEC 1.011 05/29/2023 1551   PHURINE 6.0 05/29/2023 1551   GLUCOSEU NEGATIVE 05/29/2023 1551   HGBUR MODERATE (A)  05/29/2023 1551   BILIRUBINUR NEGATIVE 05/29/2023 1551   KETONESUR NEGATIVE 05/29/2023 1551   PROTEINUR NEGATIVE 05/29/2023 1551   NITRITE NEGATIVE 05/29/2023 1551   LEUKOCYTESUR MODERATE (A) 05/29/2023 1551    Radiological Exams on Admission: DG Chest Port 1 View  Result Date: 05/29/2023 CLINICAL DATA:  Fever.  History of dementia. EXAM: PORTABLE CHEST 1 VIEW COMPARISON:  06/20/2019 FINDINGS: Lungs are adequately inflated and otherwise clear. Cardiomediastinal silhouette and remainder of the exam is unchanged. IMPRESSION: No active disease. Electronically Signed   By: Elberta Fortis M.D.   On: 05/29/2023 14:59     Data Reviewed: Relevant notes from primary care and specialist visits, past discharge summaries as available in EHR, including Care Everywhere. Prior diagnostic testing as pertinent to current admission diagnoses Updated medications and problem lists for reconciliation ED course, including vitals, labs, imaging, treatment and response to treatment Triage notes, nursing and pharmacy notes and ED provider's notes Notable results as noted in HPI   Assessment and Plan: * Sepsis secondary to UTI (HCC) Gross hematuria Sepsis criteria include fever, tachycardia, borderline blood pressure, leukocytosis with lactic acidosis and UTI Will get CT abdomen and pelvis Continue sepsis fluids Continue  Rocephin Follow cultures  Stage 3b chronic kidney disease (HCC) Appear chronic .  Unknown whether acute component IV hydration and monitor for improvement Avoid nephrotoxins  BPH (benign prostatic hyperplasia) Suspect BPH history based on meds (daughter unable to confirm but patient follows with urologist-no history of prostate cancer) Continue tamsulosin  OSA (obstructive sleep apnea) Patient intolerant of CPAP.  Has not used it in 2 years  Depression Mild cognitive impairment Continue Wellbutrin, duloxetine and memantine Follows with neurology outpatient Delirium precautions  Hypertension Hold amlodipine due to soft blood pressure on arrival  Hemiparesis affecting right side as late effect of cerebrovascular accident (HCC) Residual dysarthria and mild dysphagia post CVA Mechanical soft diet Continue atorvastatin and aspirin     DVT prophylaxis: Lovenox  Consults: none  Advance Care Planning:   Code Status: Prior   Family Communication: daughter [name] (POA) at bedside as well as son  Disposition Plan: Back to previous home environment  Severity of Illness: The appropriate patient status for this patient is INPATIENT. Inpatient status is judged to be reasonable and necessary in order to provide the required intensity of service to ensure the patient's safety. The patient's presenting symptoms, physical exam findings, and initial radiographic and laboratory data in the context of their chronic comorbidities is felt to place them at high risk for further clinical deterioration. Furthermore, it is not anticipated that the patient will be medically stable for discharge from the hospital within 2 midnights of admission.   * I certify that at the point of admission it is my clinical judgment that the patient will require inpatient hospital care spanning beyond 2 midnights from the point of admission due to high intensity of service, high risk for further deterioration and high  frequency of surveillance required.*  Author: Andris Baumann, MD 05/29/2023 4:56 PM  For on call review [email or web address].

## 2023-05-30 DIAGNOSIS — A419 Sepsis, unspecified organism: Secondary | ICD-10-CM | POA: Diagnosis not present

## 2023-05-30 DIAGNOSIS — N390 Urinary tract infection, site not specified: Secondary | ICD-10-CM | POA: Diagnosis not present

## 2023-05-30 LAB — BASIC METABOLIC PANEL
Anion gap: 9 (ref 5–15)
BUN: 17 mg/dL (ref 8–23)
CO2: 25 mmol/L (ref 22–32)
Calcium: 8.6 mg/dL — ABNORMAL LOW (ref 8.9–10.3)
Chloride: 105 mmol/L (ref 98–111)
Creatinine, Ser: 1.22 mg/dL (ref 0.61–1.24)
GFR, Estimated: 60 mL/min — ABNORMAL LOW (ref >60)
Glucose, Bld: 88 mg/dL (ref 70–99)
Potassium: 3.9 mmol/L (ref 3.5–5.1)
Sodium: 139 mmol/L (ref 135–145)

## 2023-05-30 LAB — CBC
HCT: 39.9 % (ref 39.0–52.0)
Hemoglobin: 12.5 g/dL — ABNORMAL LOW (ref 13.0–17.0)
MCH: 31.2 pg (ref 26.0–34.0)
MCHC: 31.3 g/dL (ref 30.0–36.0)
MCV: 99.5 fL (ref 80.0–100.0)
Platelets: 218 10*3/uL (ref 150–400)
RBC: 4.01 MIL/uL — ABNORMAL LOW (ref 4.22–5.81)
RDW: 14.8 % (ref 11.5–15.5)
WBC: 14.1 10*3/uL — ABNORMAL HIGH (ref 4.0–10.5)
nRBC: 0 % (ref 0.0–0.2)

## 2023-05-30 MED ORDER — ORAL CARE MOUTH RINSE: 15.0000 mL | OROMUCOSAL | Status: DC | PRN

## 2023-05-30 MED ORDER — SODIUM CHLORIDE 0.9 % IV SOLN: INTRAVENOUS | Status: DC

## 2023-05-30 NOTE — Evaluation (Signed)
Physical Therapy Evaluation Patient Details Name: [name] MRN: [id number] DOB: Oct 01, 1940 Today's Date: 05/30/2023  History of Present Illness  [name] is an 82 y.o. male admitted with sepsis secondary to UTI. PMH: memory loss, HTN, dyslipidemia, CVA with residual right hemiplegia, dysarthria , OSA not on CPAP, BPH  Clinical Impression  Pt admitted with above diagnosis. Pt from home with daughter, amb with RW at baseline, daughter providing SBA to supv with mobility and self care tasks as needed. Pt amb with CGA to min A, easily distracted by conversation needing cues for physical repositioning into RW frame, narrow BOS with R foot drawing in and hitting with forefoot unless cued, pushing RW out to R side with body on the L, no overt LOB. Pt amb 450 ft, noticeable fatigue, needing 3 rest breaks to complete. Recommend HHPT with family support; pt and daughter in agreement. Pt currently with functional limitations due to the deficits listed below (see PT Problem List). Pt will benefit from acute skilled PT to increase their independence and safety with mobility to allow discharge.           If plan is discharge home, recommend the following: A little help with walking and/or transfers;A little help with bathing/dressing/bathroom;Assistance with cooking/housework   Can travel by private vehicle        Equipment Recommendations None recommended by PT  Recommendations for Other Services       Functional Status Assessment Patient has had a recent decline in their functional status and demonstrates the ability to make significant improvements in function in a reasonable and predictable amount of time.     Precautions / Restrictions Precautions Precautions: Fall      Mobility  Bed Mobility               General bed mobility comments: in recliner upon arrival    Transfers Overall transfer level: Needs assistance Equipment used: Rolling walker (2 wheels) Transfers: Sit  to/from Stand Sit to Stand: Contact guard assist           General transfer comment: CGA for STS from recliner, verbal cues for hand placement with powering up and return to sitting    Ambulation/Gait Ambulation/Gait assistance: Contact guard assist, Min assist Gait Distance (Feet): 450 Feet (3 standing rest breaks) Assistive device: Rolling walker (2 wheels) Gait Pattern/deviations: Step-to pattern, Narrow base of support Gait velocity: decreased     General Gait Details: pt needing 3 standing rest breaks to complete, very narrow BOS with RLE adducting and foot inverting and plantarflexing into forefoot initial contact, verbal cues for widening BOS and flat foot contact ("heel down, toes straight" daughter cued), with distraction and fatigue pt with strong push of RW to R and body positioned to the L of the RW neding verbal cues and repositioning assistance back into RW frame, CGA to min A with ambulation to reduce risk for falls  Stairs            Wheelchair Mobility     Tilt Bed    Modified Rankin (Stroke Patients Only)       Balance Overall balance assessment: Needs assistance         Standing balance support: During functional activity, Reliant on assistive device for balance, Bilateral upper extremity supported Standing balance-Leahy Scale: Poor Standing balance comment: RW for static standing, min A to SBA for dynamic with RW  Pertinent Vitals/Pain Pain Assessment Pain Assessment: No/denies pain    Home Living Family/patient expects to be discharged to:: Private residence Living Arrangements: Children Available Help at Discharge: Family;Available 24 hours/day Type of Home: House Home Access: Level entry       Home Layout: One level Home Equipment: Agricultural consultant (2 wheels);Shower seat;Grab bars - toilet;Grab bars - tub/shower Additional Comments: CVA ~20 years ago    Prior Function Prior Level of Function :  Independent/Modified Independent;Needs assist             Mobility Comments: pt mobilizes with RW, supv-SBA from daughter ADLs Comments: pt ind with self care, daughter present to provide supv as needed and daughter completes household chores     Extremity/Trunk Assessment        Lower Extremity Assessment Lower Extremity Assessment: RLE deficits/detail;LLE deficits/detail RLE Deficits / Details: knee and hip AROM slow to initiate and WFL; ankle dorsiflexion to neutral; increased tone throughout extremity with RLE drawing up and adducting with foot curling into inversion and plantarflexion, improves with cues RLE Sensation: WNL LLE Deficits / Details: AROM WFL and strength 5/5 LLE Sensation: WNL LLE Coordination: WNL    Cervical / Trunk Assessment Cervical / Trunk Assessment: Normal  Communication   Communication Communication: No apparent difficulties  Cognition Arousal: Alert Behavior During Therapy: WFL for tasks assessed/performed Overall Cognitive Status: History of cognitive impairments - at baseline                                 General Comments: pt pleasant, follows verbal commands        General Comments      Exercises     Assessment/Plan    PT Assessment Patient needs continued PT services  PT Problem List Decreased strength;Decreased activity tolerance;Decreased balance;Decreased mobility;Decreased coordination;Decreased cognition;Decreased knowledge of use of DME;Decreased safety awareness       PT Treatment Interventions DME instruction;Gait training;Functional mobility training;Therapeutic activities;Therapeutic exercise;Balance training;Neuromuscular re-education;Cognitive remediation;Patient/family education    PT Goals (Current goals can be found in the Care Plan section)  Acute Rehab PT Goals Patient Stated Goal: open to thearpy, return home PT Goal Formulation: With patient/family Time For Goal Achievement:  06/13/23 Potential to Achieve Goals: Good    Frequency Min 1X/week     Co-evaluation               AM-PAC PT "6 Clicks" Mobility  Outcome Measure Help needed turning from your back to your side while in a flat bed without using bedrails?: A Little Help needed moving from lying on your back to sitting on the side of a flat bed without using bedrails?: A Little Help needed moving to and from a bed to a chair (including a wheelchair)?: A Little Help needed standing up from a chair using your arms (e.g., wheelchair or bedside chair)?: A Little Help needed to walk in hospital room?: A Little Help needed climbing 3-5 steps with a railing? : A Little 6 Click Score: 18    End of Session Equipment Utilized During Treatment: Gait belt Activity Tolerance: Patient tolerated treatment well Patient left: in chair;with call bell/phone within reach;with chair alarm set;with family/visitor present;with nursing/sitter in room Nurse Communication: Mobility status PT Visit Diagnosis: Muscle weakness (generalized) (M62.81);Unsteadiness on feet (R26.81);Other symptoms and signs involving the nervous system (R29.898)    Time: 1610-9604 PT Time Calculation (min) (ACUTE ONLY): 33 min   Charges:   PT Evaluation $  PT Eval Moderate Complexity: 1 Mod PT Treatments $Gait Training: 8-22 mins PT General Charges $$ ACUTE PT VISIT: 1 Visit         Tori Caressa Scearce PT, DPT 05/30/23, 1:43 PM

## 2023-05-30 NOTE — TOC Initial Note (Addendum)
Transition of Care [facility]) - Initial/Assessment Note    Patient Details  Name: [name] MRN: [id number] Date of Birth: 07/31/1941  [facility] of [facility]) CM/SW Contact:    Dennis Rucks, RN Phone Number: 05/30/2023, 12:39 PM  Clinical Narrative:   Met with pt and pt's dtr ([name]) at bedside to introduce role of TOC/NCM and review for dc planning. Per [name], she is pt's POA, pt resides with her and she is his primary caregiver. [name] reports pt has an established PCP and pharmacy, no current home care services, reports pt has a walker, potty lyft, shower chair. [name] to provide transportation at discharge. PT eval, await recommendation.    -3:16pm PT eval completed, recommendation HH PT. Call to pt's dtr ([name]) agreeable with HH PT, no preference. NCM will assist with [facility] PT provider.    -3:29pm [facility], rep Morrie Sheldon, accepted for [facility] PT, added to AVS.               Expected Discharge Plan: Home w Home Health Services Barriers to Discharge: Continued Medical Work up   Patient Goals and CMS Choice Patient states their goals for this hospitalization and ongoing recovery are:: return home with family support          Expected Discharge Plan and Services       Living arrangements for the past 2 months: Single Family Home                                      Prior Living Arrangements/Services Living arrangements for the past 2 months: Single Family Home Lives with:: Adult Children Patient language and need for interpreter reviewed:: Yes Do you feel safe going back to the place where you live?: Yes      Need for Family Participation in Patient Care: Yes (Comment) Care giver support system in place?: Yes (comment) Current home services: DME (walker, wheelchair, potty lift, shower chair) Criminal Activity/Legal Involvement Pertinent to Current Situation/Hospitalization: No - Comment as needed  Activities of Daily Living Home Assistive Devices/Equipment: Shower chair  with back, Walker (specify type), Eyeglasses, Grab bars in shower, Grab bars around toilet ADL Screening (condition at time of admission) Patient's cognitive ability adequate to safely complete daily activities?: Yes Is the patient deaf or have difficulty hearing?: No Does the patient have difficulty seeing, even when wearing glasses/contacts?: No Does the patient have difficulty concentrating, remembering, or making decisions?: Yes Patient able to express need for assistance with ADLs?: Yes Does the patient have difficulty dressing or bathing?: No Independently performs ADLs?: No Communication: Independent Dressing (OT): Independent Grooming: Independent Feeding: Independent Bathing: Needs assistance Is this a change from baseline?: Pre-admission baseline Toileting: Independent with device (comment) (walker) In/Out Bed: Independent with device (comment) (walker) Walks in Home: Independent with device (comment) (walker) Does the patient have difficulty walking or climbing stairs?: Yes Weakness of Legs: Right Weakness of Arms/Hands: Right  Permission Sought/Granted Permission sought to share information with : Case Manager Permission granted to share information with : Yes, Verbal Permission Granted  Share Information with NAME: [name], RN           Emotional Assessment Appearance:: Appears stated age Attitude/Demeanor/Rapport: Gracious Affect (typically observed): Accepting Orientation: : Oriented to Self, Oriented to Situation Alcohol / Substance Use: Not Applicable Psych Involvement: No (comment)  Admission diagnosis:  Sepsis secondary to UTI (HCC) [A41.9, N39.0] Patient Active Problem List  Diagnosis Date Noted   Sepsis secondary to UTI (HCC) 05/29/2023   Depression 05/29/2023   Stage 3b chronic kidney disease (HCC) 05/29/2023   OSA (obstructive sleep apnea) 05/29/2023   BPH (benign prostatic hyperplasia) 05/29/2023   Closed dislocation of interphalangeal  joint of right ring finger    Closed fracture of left hip (HCC) 06/20/2019   Hypertension 06/20/2019   Closed fracture of neck of left femur (HCC)    Memory disturbance 07/05/2018   Excessive daytime sleepiness 04/03/2018   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   Snoring 04/03/2018   Hemiplegia affecting dominant side, post-stroke (HCC) 03/07/2017   Spastic hemiplegia affecting dominant side (HCC) 02/07/2017   Hemiparesis affecting right side as late effect of cerebrovascular accident (HCC) 12/02/2016   Right foot drop 12/02/2016   Cognitive and neurobehavioral dysfunction 06/30/2015   Depression due to dementia [facility]) 01/28/2015   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 [place] [address] Phone: [phone] Fax: [phone]  [facility] - [place], [place] - [address] [place] [address] Phone: [phone] Fax: [phone]     Social Determinants of Health (SDOH) Social History: SDOH Screenings   Food Insecurity: No Food Insecurity (05/29/2023)  Housing: Low Risk  (05/29/2023)  Transportation Needs: No Transportation Needs (05/29/2023)  Utilities: Not At Risk (05/29/2023)  Tobacco Use: Low Risk  (05/29/2023)   SDOH Interventions:     Readmission Risk Interventions    05/30/2023   12:37 PM  Readmission Risk Prevention Plan  Post Dischage Appt Complete  Medication Screening Complete  Transportation Screening Complete

## 2023-05-30 NOTE — Progress Notes (Signed)
PROGRESS NOTE  [name]  DOB: Nov 23, 1940  PCP: [name], MD ZOX:[id number]  DOA: 05/29/2023  LOS: 1 day  Hospital Day: 2  Brief narrative: [name] is a 82 y.o. male with PMH significant for HTN, HLD, OSA, CVA with residual right-sided weakness/dysarthria, GERD, BPH, major depression. 9/22, patient was brought to the ED by EMS from home with hematuria since the morning.  While cleaning up in the morning, family noticed blood in the urine as well as specks of blood in the underwear and thigh.  He also complained of lower back pain.  In the ED, patient had a temperature of 100.3, heart rate elevated to 114, blood pressure in low 100s, breathing on room air. Labs with WBC count 26, BUN/creatinine 18/1.62, lactic acid 2.6. Urinalysis showed hazy yellow urine with moderate hemoglobin, moderate leukocytes, negative nitrite Chest x-ray unremarkable CT abdomen showed findings suggestive of cystitis, nonobstructive punctate right nephrolithiasis  Started on management for sepsis secondary to UTI Blood culture and urine culture was sent Given IV fluid, IV antibiotics Admitted to [facility]  Subjective: Patient was seen and examined this morning.  Pleasant elderly Caucasian male.  Sitting up in recliner.  Not in distress.  Daughter at bedside. Overnight, remains afebrile, hemodynamically stable Labs from this morning showed improvement in creatinine, lactic acid Pending CBC  Assessment and plan: Sepsis POA UTI with hematuria Brought to the ED with hematuria, low back pain  Noted to have fever, tachycardia, leukocytosis, lactic acidosis  Urinalysis with leukocytes and hemoglobin.   CT abdomen pelvis with findings of cystitis and nonobstructive nephrolithiasis. Currently being managed for sepsis secondary to UTI Continue IV Rocephin Pending urine culture report No recurrence of fever.  Lactic acid level normalized Repeat WBC this morning Urine cleared up normal Recent Labs  Lab  05/29/23 1430 05/29/23 1435 05/29/23 1513 05/29/23 1657  WBC 26.0*  --   --   --   LATICACIDVEN  --  2.6* 0.8 0.7   AKI on CKD 3B Creatinine was elevated 1.62 like secondary to sepsis.  Improved with hydration. Recent Labs    05/29/23 1430 05/30/23 0444  BUN 18 17  CREATININE 1.62* 1.22   Hypertension PTA meds- amlodipine 5 mg daily Blood pressure running low normal.  Continue to hold amlodipine for now  H/o CVA With residual right sided weakness and dysarthria-  PTA meds- aspirin 81 mg daily, Lipitor 10 mg daily Continue both  Depression Mild cognitive impairment PTA meds- Wellbutrin 150 mg daily, duloxetine 60 mg daily, memantine 10 mg twice daily Continue all. Delirium precautions Follows up with neurology as an outpatient  GERD CT abdomen showed patulous distal esophagus with PO contrast noted within the lumen It seems she does not have any symptoms and not on PPI  BPH Neurogenic bladder Follows up at [facility]rology  PTA meds-chronically on tamsulosin 0.4 mL daily, myrbetriq 50 mg daily and Ditropan 10 mg daily. Continue all   OSA Patient intolerant of CPAP.  Has not used it in 2 years   Mobility: at baseline, he is able to walk with a walker, but lately he has been weak.  PT eval ordered  Goals of care   Code Status: Do not attempt resuscitation (DNR) PRE-ARREST INTERVENTIONS DESIRED     DVT prophylaxis:  enoxaparin (LOVENOX) injection 40 mg Start: 05/29/23 2000   Antimicrobials: IV Rocephin Fluid: Switch to NS at 75 mL/h Consultants: None Family Communication: Daughter/POA at bedside  Status: Inpatient Level of care:  Progressive  Patient is from: Home Needs to continue in-hospital care: On IV antibiotics Anticipated d/c to: Hopefully home tomorrow      Diet:  Diet Order             DIET DYS 3 Room service appropriate? Yes; Fluid consistency: Thin  Diet effective now                   Scheduled Meds:  aspirin EC  81 mg  Oral QODAY   atorvastatin  10 mg Oral Daily   buPROPion  150 mg Oral Daily   DULoxetine  60 mg Oral Daily   enoxaparin (LOVENOX) injection  40 mg Subcutaneous Q24H   influenza vaccine adjuvanted  0.5 mL Intramuscular Tomorrow-1000   memantine  10 mg Oral BID   mirabegron ER  50 mg Oral Daily   oxybutynin  10 mg Oral QPM   tamsulosin  0.4 mg Oral Daily    PRN meds: acetaminophen **OR** acetaminophen, ondansetron **OR** ondansetron (ZOFRAN) IV, mouth rinse   Infusions:   cefTRIAXone (ROCEPHIN)  IV     lactated ringers 150 mL/hr (05/30/23 0732)    Antimicrobials: Anti-infectives (From admission, onward)    Start     Dose/Rate Route Frequency Ordered Stop   05/30/23 1500  cefTRIAXone (ROCEPHIN) 2 g in sodium chloride 0.9 % 100 mL IVPB        2 g 200 mL/hr over 30 Minutes Intravenous Every 24 hours 05/29/23 1716 06/06/23 1459   05/29/23 1500  cefTRIAXone (ROCEPHIN) 1 g in sodium chloride 0.9 % 100 mL IVPB        1 g 200 mL/hr over 30 Minutes Intravenous  Once 05/29/23 1459 05/29/23 1629       Objective: Vitals:   05/29/23 2013 05/30/23 0526  BP: 104/66 135/75  Pulse: 69 81  Resp: 17 17  Temp: 97.8 F (36.6 C) 98.4 F (36.9 C)  SpO2: 100% 97%    Intake/Output Summary (Last 24 hours) at 05/30/2023 0808 Last data filed at 05/30/2023 0600 Gross per 24 hour  Intake 3765.13 ml  Output 1300 ml  Net 2465.13 ml   Filed Weights   05/29/23 1232 05/30/23 0626  Weight: 66.5 kg 67.4 kg   Weight change:  Body mass index is 21.32 kg/m.   Physical Exam: General exam: Pleasant, elderly Caucasian male.  Not in distress Skin: No rashes, lesions or ulcers. HEENT: Atraumatic, normocephalic, no obvious bleeding Lungs: Clear to auscultation bilaterally CVS: Regular rate and rhythm, no murmur GI/Abd soft, nontender, nondistended, bowel sound present CNS: Alert, awake, oriented x 3.  Baseline deficits from stroke Psychiatry: Mood appropriate Extremities: No pedal edema, no calf  tenderness  Data Review: I have personally reviewed the laboratory data and studies available.  F/u labs ordered Unresulted Labs (From admission, onward)     Start     Ordered   05/29/23 1551  Urine Culture  Once,   R        05/29/23 1551            Total time spent in review of labs and imaging, patient evaluation, formulation of plan, documentation and communication with family: 55 minutes  Signed, Lorin Glass, MD [facility] 05/30/2023

## 2023-05-31 ENCOUNTER — Inpatient Hospital Stay (HOSPITAL_COMMUNITY): Payer: Medicare Other

## 2023-05-31 DIAGNOSIS — A419 Sepsis, unspecified organism: Secondary | ICD-10-CM | POA: Diagnosis not present

## 2023-05-31 DIAGNOSIS — N390 Urinary tract infection, site not specified: Secondary | ICD-10-CM | POA: Diagnosis not present

## 2023-05-31 LAB — CBC WITH DIFFERENTIAL/PLATELET
Abs Immature Granulocytes: 0.04 10*3/uL (ref 0.00–0.07)
Basophils Absolute: 0.1 10*3/uL (ref 0.0–0.1)
Basophils Relative: 1 %
Eosinophils Absolute: 0.3 10*3/uL (ref 0.0–0.5)
Eosinophils Relative: 4 %
HCT: 37.8 % — ABNORMAL LOW (ref 39.0–52.0)
Hemoglobin: 12.1 g/dL — ABNORMAL LOW (ref 13.0–17.0)
Immature Granulocytes: 0 %
Lymphocytes Relative: 11 %
Lymphs Abs: 1.1 10*3/uL (ref 0.7–4.0)
MCH: 31.8 pg (ref 26.0–34.0)
MCHC: 32 g/dL (ref 30.0–36.0)
MCV: 99.5 fL (ref 80.0–100.0)
Monocytes Absolute: 1.1 10*3/uL — ABNORMAL HIGH (ref 0.1–1.0)
Monocytes Relative: 11 %
Neutro Abs: 6.9 10*3/uL (ref 1.7–7.7)
Neutrophils Relative %: 73 %
Platelets: 227 10*3/uL (ref 150–400)
RBC: 3.8 MIL/uL — ABNORMAL LOW (ref 4.22–5.81)
RDW: 14.8 % (ref 11.5–15.5)
WBC: 9.5 10*3/uL (ref 4.0–10.5)
nRBC: 0 % (ref 0.0–0.2)

## 2023-05-31 LAB — BASIC METABOLIC PANEL
Anion gap: 9 (ref 5–15)
BUN: 14 mg/dL (ref 8–23)
CO2: 23 mmol/L (ref 22–32)
Calcium: 8.7 mg/dL — ABNORMAL LOW (ref 8.9–10.3)
Chloride: 108 mmol/L (ref 98–111)
Creatinine, Ser: 1.06 mg/dL (ref 0.61–1.24)
GFR, Estimated: >60 mL/min (ref >60)
Glucose, Bld: 98 mg/dL (ref 70–99)
Potassium: 3.5 mmol/L (ref 3.5–5.1)
Sodium: 140 mmol/L (ref 135–145)

## 2023-05-31 LAB — URINE CULTURE: Culture: >=100000 — AB

## 2023-05-31 MED ORDER — AMLODIPINE BESYLATE 5 MG PO TABS
5.0000 mg | ORAL_TABLET | Freq: Every day | ORAL | Status: DC
  Administered 2023-05-31 – 2023-06-02 (×3): 5 mg via ORAL
  Filled 2023-05-31 (×3): qty 1

## 2023-05-31 MED ORDER — SODIUM CHLORIDE 0.9 % IV SOLN: 2.0000 g | Freq: Two times a day (BID) | INTRAVENOUS | Status: DC

## 2023-05-31 MED ORDER — CIPROFLOXACIN HCL 500 MG PO TABS
500.0000 mg | ORAL_TABLET | Freq: Two times a day (BID) | ORAL | Status: DC
  Administered 2023-05-31 – 2023-06-02 (×4): 500 mg via ORAL
  Filled 2023-05-31 (×4): qty 1

## 2023-05-31 NOTE — Progress Notes (Signed)
Called regarding fall while in hospital, CT revealed small right subdural hematoma without mass effect.  Patient is neurologically at his baseline.  He is on aspirin 81 mg.  I recommend repeat CT tomorrow, overall if stable no further follow-up will be needed.  Please call with questions or concerns

## 2023-05-31 NOTE — Progress Notes (Signed)
   05/31/23 0839  What Happened  Was fall witnessed? No  Was patient injured? Yes  Patient found on floor  Found by Staff-comment  Stated prior activity bathroom-unassisted  Provider Notification  Provider Name/Title Dr. Pola Corn  Date Provider Notified 05/31/23  Time Provider Notified [phone]  Method of Notification Call;Page  Notification Reason Fall  Provider response See new orders  Date of Provider Response 05/31/23  Time of Provider Response [phone]  Follow Up  Family notified Yes - comment  Time family notified 0930  Additional tests Yes-comment (CT)  Simple treatment Dressing  Progress note created (see row info) Yes  Vitals  Temp 98.2 F (36.8 C)  Temp Source Oral  BP (!) 146/98  MAP (mmHg) 110  BP Location Left Arm  BP Method Automatic  Patient Position (if appropriate) Lying  Pulse Rate 78  Pulse Rate Source Monitor  ECG Heart Rate 80  Resp (!) 22  Oxygen Therapy  SpO2 98 %  O2 Device Room Air  Neurological  Neuro (WDL) X  Level of Consciousness Alert  Orientation Level Oriented to person;Oriented to place;Oriented to time;Disoriented to situation  Cognition Follows commands;Memory impairment  Speech Clear  Neuro Symptoms Forgetful

## 2023-05-31 NOTE — Evaluation (Signed)
SLP Cancellation Note  Patient Details Name: [name] MRN: [id number] DOB: 1941/01/21   Cancelled treatment:       Reason Eval/Treat Not Completed: Other (comment) (pt s/p fall - will continue efforts)  [name], MS [facility] SLP Acute Rehab Services Office [phone]  Chales Abrahams 05/31/2023, 11:53 AM

## 2023-05-31 NOTE — Progress Notes (Signed)
OT Cancellation Note  Patient Details Name: [name] MRN: [id number] DOB: 09-29-1940   Cancelled Treatment:    Reason Eval/Treat Not Completed: Patient not medically ready: Pt s/p in hospital fall this morning. MD to room and stated pt with 4mm bleed in head and neurosurgery has been consulted. Will hold OT for today and evaluate tomorrow if pt is medically clear.   Theodoro Clock 05/31/2023, 11:17 AM

## 2023-05-31 NOTE — Progress Notes (Signed)
PROGRESS NOTE  [name]  DOB: Aug 18, 1941  PCP: [name], MD ZOX:[id number]  DOA: 05/29/2023  LOS: 2 days  Hospital Day: 3  Brief narrative: [name] is a 82 y.o. male with PMH significant for HTN, HLD, OSA, CVA with residual right-sided weakness/dysarthria, GERD, BPH, major depression. 9/22, patient was brought to the ED by EMS from home with hematuria since the morning.  While cleaning up in the morning, family noticed blood in the urine as well as specks of blood in the underwear and thigh.  He also complained of lower back pain.  In the ED, patient had a temperature of 100.3, heart rate elevated to 114, blood pressure in low 100s, breathing on room air. Labs with WBC count 26, BUN/creatinine 18/1.62, lactic acid 2.6. Urinalysis showed hazy yellow urine with moderate hemoglobin, moderate leukocytes, negative nitrite Chest x-ray unremarkable CT abdomen showed findings suggestive of cystitis, nonobstructive punctate right nephrolithiasis  Started on management for sepsis secondary to UTI Blood culture and urine culture was sent Given IV fluid, IV antibiotics Admitted to [facility]  Subjective: Patient was seen and examined this morning.  Pleasant elderly Caucasian male.   Sitting up in recliner.  Not in distress.  Son and daughter at bedside. Event from earlier this morning noted. Patient tried to get up to use the bathroom and had an unassisted fall.  He sustained an abrasion on right forehead CT head showed 4 mm subdural hematoma.  Assessment and plan: Sepsis POA Pseudomonas UTI with hematuria Brought to the ED with hematuria, low back pain  Noted to have fever, tachycardia, leukocytosis, lactic acidosis  Urinalysis with leukocytes and hemoglobin.   CT abdomen pelvis with findings of cystitis and nonobstructive nephrolithiasis. Urine culture grew more than 100,000 CFU per mL of Pseudomonas aeruginosa. Discussed with pharmacist.  Antibiotics switched to oral  ciprofloxacin for 5 days. Recent Labs  Lab 05/29/23 1430 05/29/23 1435 05/29/23 1513 05/29/23 1657 05/30/23 0824 05/31/23 0452  WBC 26.0*  --   --   --  14.1* 9.5  LATICACIDVEN  --  2.6* 0.8 0.7  --   --    AKI on CKD 3B Creatinine was elevated 1.62 like secondary to sepsis.  Improved with hydration. Recent Labs    05/29/23 1430 05/30/23 0444 05/31/23 0452  BUN 18 17 14   CREATININE 1.62* 1.22 1.06   Subdural hematoma This morning, patient tried to get up to use the bathroom and had an unassisted fall.  He sustained an abrasion on right forehead.  No neurodeficit was noted on neurological exam.  But CT head showed 4 mm subdural hematoma.  Case was discussed with neurosurgery on-call Dr. Jake Samples.  Repeat CT scan ordered for tomorrow. For the time being, I have held aspirin and Lovenox  Hypertension PTA meds- amlodipine 5 mg daily Resume it today.  H/o CVA With residual right sided weakness and dysarthria-  PTA meds- aspirin 81 mg daily, Lipitor 10 mg daily Continue Lipitor.  Hold aspirin for now given subdural hematoma  Depression Mild cognitive impairment PTA meds- Wellbutrin 150 mg daily, duloxetine 60 mg daily, memantine 10 mg twice daily Continue all. Delirium precautions Follows up with neurology as an outpatient  GERD CT abdomen showed patulous distal esophagus with PO contrast noted within the lumen It seems she does not have any symptoms and not on PPI  BPH Neurogenic bladder Follows up at [facility]rology  PTA meds-chronically on tamsulosin 0.4 mL daily, myrbetriq 50 mg daily and Ditropan 10 mg daily. Continue  all   OSA Patient intolerant of CPAP.  Has not used it in 2 years   Mobility: at baseline, he is able to walk with a walker, but lately he has been weak.  PT eval ordered  Goals of care   Code Status: Do not attempt resuscitation (DNR) PRE-ARREST INTERVENTIONS DESIRED     DVT prophylaxis:  Place and maintain sequential compression device  Start: 05/31/23 1646   Antimicrobials: Oral ciprofloxacin Fluid: IV fluids stopped Consultants: None Family Communication: Daughter and son at bedside  Status: Inpatient Level of care:  Progressive   Patient is from: Home Needs to continue in-hospital care: Pending repeat CT head tomorrow Anticipated d/c to: Hopefully home tomorrow with home health if CT head normal    Diet:  Diet Order             DIET DYS 3 Room service appropriate? Yes; Fluid consistency: Thin  Diet effective now                   Scheduled Meds:  amLODipine  5 mg Oral Daily   atorvastatin  10 mg Oral Daily   buPROPion  150 mg Oral Daily   ciprofloxacin  500 mg Oral BID   DULoxetine  60 mg Oral Daily   memantine  10 mg Oral BID   mirabegron ER  50 mg Oral Daily   oxybutynin  10 mg Oral QPM   tamsulosin  0.4 mg Oral Daily    PRN meds: acetaminophen **OR** acetaminophen, ondansetron **OR** ondansetron (ZOFRAN) IV, mouth rinse   Infusions:     Antimicrobials: Anti-infectives (From admission, onward)    Start     Dose/Rate Route Frequency Ordered Stop   05/31/23 1700  ciprofloxacin (CIPRO) tablet 500 mg        500 mg Oral 2 times daily 05/31/23 1524 06/05/23 1959   05/31/23 1600  ceFEPIme (MAXIPIME) 2 g in sodium chloride 0.9 % 100 mL IVPB  Status:  Discontinued        2 g 200 mL/hr over 30 Minutes Intravenous Every 12 hours 05/31/23 1508 05/31/23 1524   05/30/23 1500  cefTRIAXone (ROCEPHIN) 2 g in sodium chloride 0.9 % 100 mL IVPB  Status:  Discontinued        2 g 200 mL/hr over 30 Minutes Intravenous Every 24 hours 05/29/23 1716 05/31/23 1508   05/29/23 1500  cefTRIAXone (ROCEPHIN) 1 g in sodium chloride 0.9 % 100 mL IVPB        1 g 200 mL/hr over 30 Minutes Intravenous  Once 05/29/23 1459 05/29/23 1629       Objective: Vitals:   05/31/23 0839 05/31/23 1334  BP: (!) 146/98 135/82  Pulse: 78 84  Resp: (!) 22 16  Temp: 98.2 F (36.8 C) 98.4 F (36.9 C)  SpO2: 98% 94%     Intake/Output Summary (Last 24 hours) at 05/31/2023 1647 Last data filed at 05/31/2023 1300 Gross per 24 hour  Intake 1419.62 ml  Output 1625 ml  Net -205.38 ml   Filed Weights   05/29/23 1232 05/30/23 0626  Weight: 66.5 kg 67.4 kg   Weight change:  Body mass index is 21.32 kg/m.   Physical Exam: General exam: Pleasant, elderly Caucasian male.  Not in distress Skin: No rashes, lesions or ulcers. HEENT: normocephalic, no obvious bleeding.  Right forehead with taped abrasion Lungs: Clear to auscultation bilaterally CVS: Regular rate and rhythm, no murmur GI/Abd soft, nontender, nondistended, bowel sound present CNS: Alert, awake, oriented  x 3.  Baseline deficits from stroke Psychiatry: Mood appropriate Extremities: No pedal edema, no calf tenderness  Data Review: I have personally reviewed the laboratory data and studies available.  F/u labs ordered Unresulted Labs (From admission, onward)     Start     Ordered   05/31/23 0500  Basic metabolic panel  Daily,   R     Question:  Specimen collection method  Answer:  Lab=Lab collect   05/30/23 0814   05/31/23 0500  CBC with Differential/Platelet  Daily,   R     Question:  Specimen collection method  Answer:  Lab=Lab collect   05/30/23 0814            Total time spent in review of labs and imaging, patient evaluation, formulation of plan, documentation and communication with family: 45 minutes  Signed, Lorin Glass, MD [facility] 05/31/2023

## 2023-05-31 NOTE — Plan of Care (Signed)
  Problem: Fluid Volume: Goal: Hemodynamic stability will improve Outcome: Progressing   Problem: Clinical Measurements: Goal: Signs and symptoms of infection will decrease Outcome: Progressing   Problem: Education: Goal: Knowledge of General Education information will improve Description: Including pain rating scale, medication(s)/side effects and non-pharmacologic comfort measures Outcome: Progressing   Problem: Health Behavior/Discharge Planning: Goal: Ability to manage health-related needs will improve Outcome: Progressing   Problem: Clinical Measurements: Goal: Diagnostic test results will improve Outcome: Progressing   Problem: Activity: Goal: Risk for activity intolerance will decrease Outcome: Progressing   Problem: Nutrition: Goal: Adequate nutrition will be maintained Outcome: Progressing   Problem: Coping: Goal: Level of anxiety will decrease Outcome: Progressing   Problem: Elimination: Goal: Will not experience complications related to urinary retention Outcome: Progressing   Problem: Safety: Goal: Ability to remain free from injury will improve Outcome: Progressing   Problem: Skin Integrity: Goal: Risk for impaired skin integrity will decrease Outcome: Progressing   Problem: Clinical Measurements: Goal: Diagnostic test results will improve Outcome: Adequate for Discharge   Problem: Respiratory: Goal: Ability to maintain adequate ventilation will improve Outcome: Adequate for Discharge   Problem: Clinical Measurements: Goal: Respiratory complications will improve Outcome: Adequate for Discharge   Problem: Pain Managment: Goal: General experience of comfort will improve Outcome: Adequate for Discharge

## 2023-06-01 ENCOUNTER — Inpatient Hospital Stay (HOSPITAL_COMMUNITY): Payer: Medicare Other

## 2023-06-01 DIAGNOSIS — N390 Urinary tract infection, site not specified: Secondary | ICD-10-CM | POA: Diagnosis not present

## 2023-06-01 DIAGNOSIS — N1832 Chronic kidney disease, stage 3b: Secondary | ICD-10-CM | POA: Diagnosis not present

## 2023-06-01 DIAGNOSIS — A419 Sepsis, unspecified organism: Secondary | ICD-10-CM | POA: Diagnosis not present

## 2023-06-01 DIAGNOSIS — I69351 Hemiplegia and hemiparesis following cerebral infarction affecting right dominant side: Secondary | ICD-10-CM | POA: Diagnosis not present

## 2023-06-01 LAB — BASIC METABOLIC PANEL
Anion gap: 6 (ref 5–15)
BUN: 12 mg/dL (ref 8–23)
CO2: 23 mmol/L (ref 22–32)
Calcium: 8.5 mg/dL — ABNORMAL LOW (ref 8.9–10.3)
Chloride: 108 mmol/L (ref 98–111)
Creatinine, Ser: 1.22 mg/dL (ref 0.61–1.24)
GFR, Estimated: 60 mL/min — ABNORMAL LOW (ref >60)
Glucose, Bld: 96 mg/dL (ref 70–99)
Potassium: 4.7 mmol/L (ref 3.5–5.1)
Sodium: 137 mmol/L (ref 135–145)

## 2023-06-01 LAB — CBC WITH DIFFERENTIAL/PLATELET
Abs Immature Granulocytes: 0.03 10*3/uL (ref 0.00–0.07)
Basophils Absolute: 0.1 10*3/uL (ref 0.0–0.1)
Basophils Relative: 1 %
Eosinophils Absolute: 0.4 10*3/uL (ref 0.0–0.5)
Eosinophils Relative: 4 %
HCT: 39.1 % (ref 39.0–52.0)
Hemoglobin: 12.7 g/dL — ABNORMAL LOW (ref 13.0–17.0)
Immature Granulocytes: 0 %
Lymphocytes Relative: 15 %
Lymphs Abs: 1.4 10*3/uL (ref 0.7–4.0)
MCH: 31.5 pg (ref 26.0–34.0)
MCHC: 32.5 g/dL (ref 30.0–36.0)
MCV: 97 fL (ref 80.0–100.0)
Monocytes Absolute: 1 10*3/uL (ref 0.1–1.0)
Monocytes Relative: 11 %
Neutro Abs: 6.3 10*3/uL (ref 1.7–7.7)
Neutrophils Relative %: 69 %
Platelets: 261 10*3/uL (ref 150–400)
RBC: 4.03 MIL/uL — ABNORMAL LOW (ref 4.22–5.81)
RDW: 14.7 % (ref 11.5–15.5)
WBC: 9.1 10*3/uL (ref 4.0–10.5)
nRBC: 0 % (ref 0.0–0.2)

## 2023-06-01 MED ORDER — FINASTERIDE 5 MG PO TABS
5.0000 mg | ORAL_TABLET | Freq: Every day | ORAL | Status: DC
  Administered 2023-06-01 – 2023-06-02 (×2): 5 mg via ORAL
  Filled 2023-06-01 (×2): qty 1

## 2023-06-01 MED ORDER — TAMSULOSIN HCL 0.4 MG PO CAPS
0.8000 mg | ORAL_CAPSULE | Freq: Every day | ORAL | Status: DC
  Administered 2023-06-02: 0.8 mg via ORAL
  Filled 2023-06-01: qty 2

## 2023-06-01 NOTE — Progress Notes (Signed)
Physical Therapy Treatment Patient Details Name: [name] MRN: [id number] DOB: Jan 29, 1941 Today's Date: 06/01/2023   History of Present Illness [name] is an 82 y.o. male admitted with sepsis secondary to UTI. PMH: memory loss, HTN, dyslipidemia, CVA with residual right hemiplegia, dysarthria , OSA not on CPAP, BPH    PT Comments  Pt seen for PT tx with pt agreeable, daughter [name]) present for session. Daughter provides hands on assist throughout session, appropriately cuing pt throughout. Pt is able to ambulate with RW & CGA with PT then hand off to daughter. Pt's daughter voices comfort with assisting pt at home at [facility]. Will continue to follow pt acutely to address balance, strengthening, endurance.    If plan is discharge home, recommend the following: A little help with walking and/or transfers;A little help with bathing/dressing/bathroom;Assistance with cooking/housework   Can travel by private vehicle        Equipment Recommendations  None recommended by PT    Recommendations for Other Services       Precautions / Restrictions Precautions Precautions: Fall Precaution Comments: Fall in hospital on 05/31/23 in AM. Pt was attempting to go to bathroom. Restrictions Weight Bearing Restrictions: No     Mobility  Bed Mobility Overal bed mobility: Needs Assistance Bed Mobility: Supine to Sit     Supine to sit: Supervision, Used rails, HOB elevated          Transfers Overall transfer level: Needs assistance Equipment used: Rolling walker (2 wheels) Transfers: Sit to/from Stand Sit to Stand: Min assist           General transfer comment: posterior lean, cuing for hand placement during sit<>stand    Ambulation/Gait Ambulation/Gait assistance: Contact guard assist Gait Distance (Feet): 450 Feet (2 standing rest breaks 2/2 fatigue) Assistive device: Rolling walker (2 wheels) Gait Pattern/deviations: Step-to pattern, Narrow base of support, Decreased  dorsiflexion - right, Decreased stride length, Decreased step length - right, Decreased step length - left, Decreased weight shift to right, Decreased stance time - right Gait velocity: decreased     General Gait Details: Decreased dorsiflexion RLE during swing phase, decreased hip/knee flexion RLE during swing phase, absent heel strike RLE. PT providing cuing to ambulate within base of RW vs pt leaning to L, pushing RW slightly to R, as well as for increased step width RLE with pt able to return demo.   Stairs             Wheelchair Mobility     Tilt Bed    Modified Rankin (Stroke Patients Only)       Balance Overall balance assessment: Needs assistance Sitting-balance support: Feet supported, Bilateral upper extremity supported Sitting balance-Leahy Scale: Fair Sitting balance - Comments: posterior LOB when sitting without BUE/BLE support while raising BLE to allow daughter to thread pants on BLE   Standing balance support: During functional activity, Bilateral upper extremity supported, Reliant on assistive device for balance Standing balance-Leahy Scale: Fair                              Cognition Arousal: Alert Behavior During Therapy: WFL for tasks assessed/performed Overall Cognitive Status: History of cognitive impairments - at baseline                                 General Comments: decreased safety awareness - fall in hospital, attempting to go to  bathroom        Exercises      General Comments General comments (skin integrity, edema, etc.): HR 114 bpm after gait      Pertinent Vitals/Pain Pain Assessment Pain Assessment: No/denies pain    Home Living                          Prior Function            PT Goals (current goals can now be found in the care plan section) Acute Rehab PT Goals Patient Stated Goal: open to thearpy, return home PT Goal Formulation: With patient/family Time For Goal Achievement:  06/13/23 Potential to Achieve Goals: Good Progress towards PT goals: Progressing toward goals    Frequency    Min 1X/week      PT Plan      Co-evaluation              AM-PAC PT "6 Clicks" Mobility   Outcome Measure  Help needed turning from your back to your side while in a flat bed without using bedrails?: None Help needed moving from lying on your back to sitting on the side of a flat bed without using bedrails?: A Little Help needed moving to and from a bed to a chair (including a wheelchair)?: A Little Help needed standing up from a chair using your arms (e.g., wheelchair or bedside chair)?: A Little Help needed to walk in hospital room?: A Little Help needed climbing 3-5 steps with a railing? : A Little 6 Click Score: 19    End of Session   Activity Tolerance: Patient tolerated treatment well Patient left: in chair;with chair alarm set;with family/visitor present;with call bell/phone within reach   PT Visit Diagnosis: Muscle weakness (generalized) (M62.81);Unsteadiness on feet (R26.81);Other symptoms and signs involving the nervous system (R29.898);Other abnormalities of gait and mobility (R26.89)     Time: 4166-0630 PT Time Calculation (min) (ACUTE ONLY): 26 min  Charges:    $Gait Training: 8-22 mins $Therapeutic Activity: 8-22 mins PT General Charges $$ ACUTE PT VISIT: 1 Visit                     Aleda Grana, PT, DPT 06/01/23, 11:40 AM    [name] 06/01/2023, 11:40 AM

## 2023-06-01 NOTE — Plan of Care (Signed)
°  Problem: Fluid Volume: °Goal: Hemodynamic stability will improve °Outcome: Progressing °  °Problem: Clinical Measurements: °Goal: Diagnostic test results will improve °Outcome: Progressing °Goal: Signs and symptoms of infection will decrease °Outcome: Progressing °  °

## 2023-06-01 NOTE — Progress Notes (Signed)
[facility] PROGRESS NOTE    Progress Note  [name]  UJW:[id number] DOB: 1941/02/24 DOA: 05/29/2023 PCP: [name], MD     Brief Narrative:   [name] is an 82 y.o. 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 Urine culture grew Pseudomonas now transition to ciprofloxacin for 5 days.  Acute kidney injury on Stage 3b chronic kidney disease (HCC) With a baseline creatinine of around 1 on admission 1.6 started on IV fluids and creatinine has improved.  Likely due to sepsis.  Subdural hematoma secondary to a fall in house: CT of the head was done on the day of fall showed 4 mm subdural hematoma. Discontinue all antiplatelet therapy and anticoagulation. The previous physician spoke with neurosurgeon and recommended to repeat the CT scan of the head today awaiting results.  Essential hypertension: Continue amlodipine blood pressure is well-controlled.  History of CVA: With residual right-sided weakness.   DVT prophylaxis: scd Family Communication:daughter Status is: Inpatient Remains inpatient appropriate because: Severe sepsis due to UTI    Code Status:     Code Status Orders  (From admission, onward)           Start     Ordered   05/29/23 1715  Do not attempt resuscitation (DNR) Pre-Arrest Interventions Desired  Continuous       Question Answer Comment  If pulseless and not breathing No CPR or chest compressions.   In Pre-Arrest Conditions (Patient  Has Pulse and Is Breathing) May intubate, use advanced airway interventions and cardioversion/ACLS medications if appropriate or indicated. May transfer to ICU.   Consent: Discussion documented in EHR or advanced directives reviewed      05/29/23 1716           Code Status History     Date Active Date Inactive Code Status Order ID Comments User Context   06/20/2019 0151 06/23/2019 2050 DNR [id number]  Eduard Clos, MD ED   06/20/2019 0024 06/20/2019 0151 DNR [id number]  Eduard Clos, MD ED      Advance Directive Documentation    Flowsheet Row Most Recent Value  Type of Advance Directive Healthcare Power of Attorney  Pre-existing out of facility DNR order (yellow form or pink MOST form) --  "MOST" Form in Place? --         IV Access:   Peripheral IV   Procedures and diagnostic studies:   CT HEAD WO CONTRAST ( )  Result Date: 05/31/2023 CLINICAL DATA:  Head trauma EXAM: CT HEAD WITHOUT CONTRAST TECHNIQUE: Contiguous axial images were obtained from the base of the skull through the vertex without intravenous contrast. RADIATION DOSE REDUCTION: This exam was performed according to the departmental dose-optimization program which includes automated exposure control, adjustment of the mA and/or kV according to patient size and/or use of iterative reconstruction technique. COMPARISON:  CT head 04/02/2004 FINDINGS: Brain: There is a 4 mm thick right frontal subdural hematoma without mass effect or midline shift. There is no other acute intracranial hemorrhage there is no evidence of acute territorial infarct. There is background  parenchymal volume loss with prominence of the ventricular system and extra-axial CSF spaces. There are remote infarcts in the thalami, left larger than right, and right cerebellar hemisphere. Patchy hypodensity in the supratentorial white matter likely reflects sequela of underlying chronic small-vessel ischemic change. The pituitary and  suprasellar region are normal. There is no mass lesion. There is no mass effect or midline shift. Vascular: There is calcification of the bilateral carotid siphons. Skull: Normal. Negative for fracture or focal lesion. Sinuses/Orbits: The imaged paranasal sinuses are clear. The globes and orbits are unremarkable. Other: There is mild right frontal scalp swelling. IMPRESSION: 1. 4 mm thick acute right frontal subdural hematoma without mass effect or midline shift. 2. Mild right frontal scalp swelling without underlying calvarial fracture. Critical Value/emergent results were called by telephone at the time of interpretation on 05/31/2023 at 11:09 am to provider [facility] , who verbally acknowledged these results. Electronically Signed   By: Lesia Hausen M.D.   On: 05/31/2023 11:09     Medical Consultants:   None.   Subjective:    [name] has no new complaints  Objective:    Vitals:   05/31/23 1334 05/31/23 1703 05/31/23 2042 06/01/23 0538  BP: 135/82 (!) 154/79 135/79 131/65  Pulse: 84 81 87 68  Resp: 16 16 17 19   Temp: 98.4 F (36.9 C) 98.1 F (36.7 C) 98.6 F (37 C) 98.7 F (37.1 C)  TempSrc: Oral  Oral Oral  SpO2: 94% 96% 96% 94%  Weight:      Height:       SpO2: 94 %   Intake/Output Summary (Last 24 hours) at 06/01/2023 0848 Last data filed at 06/01/2023 0600 Gross per 24 hour  Intake 340 ml  Output 2425 ml  Net -2085 ml   Filed Weights   05/29/23 1232 05/30/23 0626  Weight: 66.5 kg 67.4 kg    Exam: General exam: In no acute distress. Respiratory system: Good air movement and clear to auscultation. Cardiovascular system: S1 & S2 heard, RRR. No JVD. Gastrointestinal system: Abdomen is nondistended, soft and nontender.  Extremities: No pedal edema. Skin: No rashes, lesions or ulcers Psychiatry: Judgement and insight appear normal. Mood & affect appropriate.    Data Reviewed:    Labs: Basic Metabolic Panel: Recent Labs  Lab 05/29/23 1430  05/30/23 0444 05/31/23 0452 06/01/23 0459  NA 138 139 140 137  K 4.2 3.9 3.5 4.7  CL 106 105 108 108  CO2 23 25 23 23   GLUCOSE 108* 88 98 96  BUN 18 17 14 12   CREATININE 1.62* 1.22 1.06 1.22  CALCIUM 8.9 8.6* 8.7* 8.5*   GFR Estimated Creatinine Clearance: 46 mL/min (by C-G formula based on SCr of 1.22 mg/dL). Liver Function Tests: Recent Labs  Lab 05/29/23 1430  AST 36  ALT 23  ALKPHOS 80  BILITOT 1.6*  PROT 6.5  ALBUMIN 3.4*   No results for input(s): "LIPASE", "AMYLASE" in the last 168 hours. No results for input(s): "AMMONIA" in the last 168 hours. Coagulation profile Recent Labs  Lab 05/29/23 1430  INR 1.0   COVID-19 Labs  No results for input(s): "DDIMER", "FERRITIN", "LDH", "CRP" in the last 72 hours.  Lab Results  Component Value Date   SARSCOV2NAA NEGATIVE 06/20/2019    CBC: Recent Labs  Lab 05/29/23 1430 05/30/23 0824 05/31/23 0452 06/01/23 0459  WBC 26.0* 14.1* 9.5 9.1  NEUTROABS 23.3*  --  6.9 6.3  HGB 14.3 12.5* 12.1* 12.7*  HCT 44.0 39.9 37.8*  39.1  MCV 98.2 99.5 99.5 97.0  PLT 272 218 227 261   Cardiac Enzymes: No results for input(s): "CKTOTAL", "CKMB", "CKMBINDEX", "TROPONINI" in the last 168 hours. BNP (last 3 results) No results for input(s): "PROBNP" in the last 8760 hours. CBG: No results for input(s): "GLUCAP" in the last 168 hours. D-Dimer: No results for input(s): "DDIMER" in the last 72 hours. Hgb A1c: No results for input(s): "HGBA1C" in the last 72 hours. Lipid Profile: No results for input(s): "CHOL", "HDL", "LDLCALC", "TRIG", "CHOLHDL", "LDLDIRECT" in the last 72 hours. Thyroid function studies: No results for input(s): "TSH", "T4TOTAL", "T3FREE", "THYROIDAB" in the last 72 hours.  Invalid input(s): "FREET3" Anemia work up: No results for input(s): "VITAMINB12", "FOLATE", "FERRITIN", "TIBC", "IRON", "RETICCTPCT" in the last 72 hours. Sepsis Labs: Recent Labs  Lab 05/29/23 1430 05/29/23 1435 05/29/23 1513  05/29/23 1657 05/30/23 0824 05/31/23 0452 06/01/23 0459  WBC 26.0*  --   --   --  14.1* 9.5 9.1  LATICACIDVEN  --  2.6* 0.8 0.7  --   --   --    Microbiology Recent Results (from the past 240 hour(s))  Blood Culture (routine x 2)     Status: None (Preliminary result)   Collection Time: 05/29/23  2:15 PM   Specimen: BLOOD  Result Value Ref Range Status   Specimen Description   Final    BLOOD RIGHT ANTECUBITAL Performed at [facility], [address] [place], [place] [address]    Special Requests   Final    BOTTLES DRAWN AEROBIC ONLY Blood Culture results may not be optimal due to an inadequate volume of blood received in culture bottles Performed at [facility], [address] [place], [place] [address]    Culture   Final    NO GROWTH 3 DAYS Performed at [facility], [address], [place], [place] [address]    Report Status PENDING  Incomplete  Blood Culture (routine x 2)     Status: None (Preliminary result)   Collection Time: 05/29/23  2:20 PM   Specimen: BLOOD RIGHT FOREARM  Result Value Ref Range Status   Specimen Description   Final    BLOOD RIGHT FOREARM Performed at [facility], [address] [place], [place] [address]    Special Requests   Final    BOTTLES DRAWN AEROBIC AND ANAEROBIC Blood Culture results may not be optimal due to an inadequate volume of blood received in culture bottles Performed at [facility], [address], [place], [place] [address]    Culture   Final    NO GROWTH 3 DAYS Performed at [facility], [address], [place], [place] [address]    Report Status PENDING  Incomplete  Urine Culture     Status: Abnormal   Collection Time: 05/29/23  3:51 PM   Specimen: Urine, Random  Result Value Ref Range Status   Specimen Description   Final    URINE, RANDOM Performed at [facility], [address], [place], [place] [address]    Special  Requests   Final    NONE Reflexed from [id number] Performed at [facility], [address], [place], [place] [address]    Culture >=100,000 COLONIES/mL PSEUDOMONAS AERUGINOSA (A)  Final   Report Status 05/31/2023 FINAL  Final   Organism ID, Bacteria PSEUDOMONAS AERUGINOSA (A)  Final      Susceptibility   Pseudomonas aeruginosa - MIC*  CEFTAZIDIME <=1 SENSITIVE Sensitive     CIPROFLOXACIN <=0.25 SENSITIVE Sensitive     GENTAMICIN <=1 SENSITIVE Sensitive     IMIPENEM <=0.25 SENSITIVE Sensitive     PIP/TAZO <=4 SENSITIVE Sensitive     CEFEPIME 0.25 SENSITIVE Sensitive     * >=100,000 COLONIES/mL PSEUDOMONAS AERUGINOSA     Medications:    amLODipine  5 mg Oral Daily   atorvastatin  10 mg Oral Daily   buPROPion  150 mg Oral Daily   ciprofloxacin  500 mg Oral BID   DULoxetine  60 mg Oral Daily   memantine  10 mg Oral BID   mirabegron ER  50 mg Oral Daily   oxybutynin  10 mg Oral QPM   tamsulosin  0.4 mg Oral Daily   Continuous Infusions:    LOS: 3 days   Marinda Elk  [facility]  06/01/2023, 8:48 AM

## 2023-06-01 NOTE — Evaluation (Signed)
Occupational Therapy Evaluation Patient Details Name: [name] MRN: [id number] DOB: December 09, 1940 Today's Date: 06/01/2023   History of Present Illness [name] is an 82 yr old male admitted with sepsis secondary to UTI. PMH: memory loss, HTN, dyslipidemia, CVA with residual right weakness, dysarthria , OSA not on CPAP, BPH   Clinical Impression   The pt is currently limited by the below listed deficits, which compromise his ADL performance and overall functional independence (see OT problem list).  He currently requires CGA to min assist for tasks, including, sit to stand with RW, lower body dressing, and short distance ambulation with RW. He will benefit from further OT services to maximize his safety and independence with ADLs & to facilitate his safe return home. Home health therapy recommended upon discharge.       If plan is discharge home, recommend the following: A little help with walking and/or transfers;A little help with bathing/dressing/bathroom;Assistance with cooking/housework;Assist for transportation;Help with stairs or ramp for entrance    Functional Status Assessment  Patient has had a recent decline in their functional status and demonstrates the ability to make significant improvements in function in a reasonable and predictable amount of time.  Equipment Recommendations  None recommended by OT    Recommendations for Other Services       Precautions / Restrictions Precautions Precautions: Fall Precaution Comments: Fall in hospital on 05/31/23 in AM. Pt was attempting to go to bathroom. Restrictions Weight Bearing Restrictions: No      Mobility Bed Mobility Overal bed mobility: Needs Assistance Bed Mobility: Supine to Sit     Supine to sit: Supervision, Used rails, HOB elevated          Transfers Overall transfer level: Needs assistance Equipment used: Rolling walker (2 wheels) Transfers: Sit to/from Stand Sit to Stand: Contact guard assist,  From elevated surface           Balance       Sitting balance - Comments: static sitting-good. dynamic sitting- fair to fair+       Standing balance comment: CGA to min assist with RW                           ADL either performed or assessed with clinical judgement   ADL Overall ADL's : Needs assistance/impaired Eating/Feeding: Set up;Sitting   Grooming: Set up;Sitting           Upper Body Dressing : Set up;Sitting   Lower Body Dressing: Minimal assistance;Sit to/from stand   Toilet Transfer: Contact guard assist;Rolling walker (2 wheels);Regular Toilet;Ambulation Toilet Transfer Details (indicate cue type and reason): at bathroom level, based on clinical judgement Toileting- Clothing Manipulation and Hygiene: Contact guard assist;Sit to/from stand Toileting - Clothing Manipulation Details (indicate cue type and reason): based on clinical judgement                          Pertinent Vitals/Pain Pain Assessment Pain Assessment: No/denies pain     Extremity/Trunk Assessment Upper Extremity Assessment Upper Extremity Assessment: Overall WFL for tasks assessed   Lower Extremity Assessment RLE Deficits / Details: knee and hip AROM slow to initiate and WFL; ankle dorsiflexion to neutral; increased tone throughout extremity with RLE drawing up and adducting with foot curling into inversion and plantarflexion, improves with cues (per PT evaluation) LLE Deficits / Details: AROM WFL and strength 5/5 (per PT evaluation)       Communication  History of dysarthria   Cognition Arousal: Alert Behavior During Therapy: WFL for tasks assessed/performed          General Comments: Oriented to person, place, and year. Disoriented to month. Able to follow 1 step commands consistently.                Home Living Family/patient expects to be discharged to:: Private residence Living Arrangements: Children (daughter)   Type of Home: House Home  Access: Stairs to enter Secretary/administrator of Steps: 1   Home Layout: One level     Bathroom Shower/Tub: Walk-in shower         Home Equipment: Agricultural consultant (2 wheels);Wheelchair - manual;Shower seat          Prior Functioning/Environment Prior Level of Function : Independent/Modified Independent;Needs assist             Mobility Comments: He ambulated in the home using a RW. ADLs Comments: He was modified independent to independent with ADLs; his daughter managed the cooking, cleaning, and driving.        OT Problem List: Impaired balance (sitting and/or standing);Decreased coordination;Decreased safety awareness;Decreased knowledge of use of DME or AE      OT Treatment/Interventions: Self-care/ADL training;Therapeutic exercise;Energy conservation;DME and/or AE instruction;Therapeutic activities;Balance training;Patient/family education    OT Goals(Current goals can be found in the care plan section) Acute Rehab OT Goals Patient Stated Goal: to return home tomorrow OT Goal Formulation: With patient Time For Goal Achievement: 06/15/23 Potential to Achieve Goals: Good ADL Goals Pt Will Perform Grooming: with supervision;standing Pt Will Perform Lower Body Dressing: with supervision;sitting/lateral leans;sit to/from stand Pt Will Transfer to Toilet: with supervision;ambulating;grab bars Pt Will Perform Toileting - Clothing Manipulation and hygiene: with supervision;sit to/from stand  OT Frequency: Min 1X/week       AM-PAC OT "6 Clicks" Daily Activity     Outcome Measure Help from another person eating meals?: A Little Help from another person taking care of personal grooming?: A Little Help from another person toileting, which includes using toliet, bedpan, or urinal?: A Little Help from another person bathing (including washing, rinsing, drying)?: A Lot Help from another person to put on and taking off regular upper body clothing?: A Little Help from  another person to put on and taking off regular lower body clothing?: A Little 6 Click Score: 17   End of Session Equipment Utilized During Treatment: Gait belt;Rolling walker (2 wheels) Nurse Communication: Mobility status  Activity Tolerance: Patient tolerated treatment well Patient left: in chair;with call bell/phone within reach;with chair alarm set  OT Visit Diagnosis: Unsteadiness on feet (R26.81);History of falling (Z91.81);Other abnormalities of gait and mobility (R26.89)                Time: 1610-9604 OT Time Calculation (min): 13 min Charges:  OT General Charges $OT Visit: 1 Visit OT Evaluation $OT Eval Moderate Complexity: 1 Mod   Kristof Nadeem L Hale Chalfin, OTR/L 06/01/2023, 5:14 PM

## 2023-06-02 DIAGNOSIS — A419 Sepsis, unspecified organism: Secondary | ICD-10-CM | POA: Diagnosis not present

## 2023-06-02 DIAGNOSIS — N390 Urinary tract infection, site not specified: Secondary | ICD-10-CM | POA: Diagnosis not present

## 2023-06-02 DIAGNOSIS — I69351 Hemiplegia and hemiparesis following cerebral infarction affecting right dominant side: Secondary | ICD-10-CM | POA: Diagnosis not present

## 2023-06-02 DIAGNOSIS — N1832 Chronic kidney disease, stage 3b: Secondary | ICD-10-CM | POA: Diagnosis not present

## 2023-06-02 LAB — CBC WITH DIFFERENTIAL/PLATELET
Abs Immature Granulocytes: 0.05 10*3/uL (ref 0.00–0.07)
Basophils Absolute: 0.1 10*3/uL (ref 0.0–0.1)
Basophils Relative: 1 %
Eosinophils Absolute: 0.6 10*3/uL — ABNORMAL HIGH (ref 0.0–0.5)
Eosinophils Relative: 8 %
HCT: 39.2 % (ref 39.0–52.0)
Hemoglobin: 12.5 g/dL — ABNORMAL LOW (ref 13.0–17.0)
Immature Granulocytes: 1 %
Lymphocytes Relative: 19 %
Lymphs Abs: 1.4 10*3/uL (ref 0.7–4.0)
MCH: 31 pg (ref 26.0–34.0)
MCHC: 31.9 g/dL (ref 30.0–36.0)
MCV: 97.3 fL (ref 80.0–100.0)
Monocytes Absolute: 0.7 10*3/uL (ref 0.1–1.0)
Monocytes Relative: 10 %
Neutro Abs: 4.5 10*3/uL (ref 1.7–7.7)
Neutrophils Relative %: 61 %
Platelets: 260 10*3/uL (ref 150–400)
RBC: 4.03 MIL/uL — ABNORMAL LOW (ref 4.22–5.81)
RDW: 14.7 % (ref 11.5–15.5)
WBC: 7.2 10*3/uL (ref 4.0–10.5)
nRBC: 0 % (ref 0.0–0.2)

## 2023-06-02 LAB — CULTURE, BLOOD (ROUTINE X 2)
Culture: NO GROWTH
Culture: NO GROWTH

## 2023-06-02 LAB — BASIC METABOLIC PANEL
Anion gap: 8 (ref 5–15)
BUN: 16 mg/dL (ref 8–23)
CO2: 24 mmol/L (ref 22–32)
Calcium: 8.5 mg/dL — ABNORMAL LOW (ref 8.9–10.3)
Chloride: 103 mmol/L (ref 98–111)
Creatinine, Ser: 1.18 mg/dL (ref 0.61–1.24)
GFR, Estimated: >60 mL/min (ref >60)
Glucose, Bld: 100 mg/dL — ABNORMAL HIGH (ref 70–99)
Potassium: 3.7 mmol/L (ref 3.5–5.1)
Sodium: 135 mmol/L (ref 135–145)

## 2023-06-02 MED ORDER — TAMSULOSIN HCL 0.4 MG PO CAPS: 0.8000 mg | ORAL_CAPSULE | Freq: Every day | ORAL | 0 refills | Status: AC

## 2023-06-02 MED ORDER — CIPROFLOXACIN HCL 500 MG PO TABS: 500.0000 mg | ORAL_TABLET | Freq: Two times a day (BID) | ORAL | 0 refills | Status: DC

## 2023-06-02 MED ORDER — CIPROFLOXACIN HCL 500 MG PO TABS: 500.0000 mg | ORAL_TABLET | Freq: Two times a day (BID) | ORAL | 0 refills | Status: AC

## 2023-06-02 NOTE — Progress Notes (Signed)
SLP Cancellation Note  Patient Details Name: [name] MRN: [id number] DOB: 03-22-1941   Cancelled treatment:       Reason Eval/Treat Not Completed: SLP screened, no needs identified, will sign off Patient with plans to discharge today. According to chart review, family and RN, patient at baseline from a cognitive-linguistic standpoint with known deficits. No SLP eval needed at this time.   Klint Lezcano MA, CCC-SLP   [name] 06/02/2023, 11:10 AM

## 2023-06-02 NOTE — Plan of Care (Signed)
°  Problem: Fluid Volume: °Goal: Hemodynamic stability will improve °Outcome: Progressing °  °Problem: Clinical Measurements: °Goal: Diagnostic test results will improve °Outcome: Progressing °Goal: Signs and symptoms of infection will decrease °Outcome: Progressing °  °

## 2023-06-02 NOTE — Discharge Summary (Signed)
Physician Discharge Summary  [name] ZOX:[id number] DOB: 10/16/1940 DOA: 05/29/2023  PCP: [name], MD  Admit date: 05/29/2023 Discharge date: 06/02/2023  Admitted From: Home Disposition:  Home  Recommendations for Outpatient Follow-up:  Follow up with Urology in 1-2 weeks Please obtain BMP/CBC in one week   Home Health:No Equipment/Devices:None  Discharge Condition:Stable CODE STATUS:Full Diet recommendation: Heart Healthy  Brief/Interim Summary: 82 y.o. 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, major depression was brought in family due to hematuria on aspirin and also some back pain, in the ED Tmax of 100.3 tachycardic white count of 26,000 lactic acid 1.6 CT scan of the abdomen and pelvis showed acute cystitis nonobstructive nephrolithiasis   Discharge Diagnoses:  Principal Problem:   Sepsis secondary to UTI [facility]) Active Problems:   Stage 3b chronic kidney disease (HCC)   MCI (mild cognitive impairment) with memory loss   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 He will follow-up with urology as an outpatient he relates is not emptying his bladder well.  Acute kidney injury chronic kidney disease stage IIIb: Resolved with IV fluids likely due to sepsis.  Subdural hematoma secondary to fall: CT of the head was done that showed for millimeter subdural hematoma. All antiplatelet therapy were discontinued. Neurology was curb sided recommended to repeat the CT scan that showed complete resolution of the hematoma.  Essential hypertension: No changes made to his  medication.  History of CVA: With residual right-sided weakness he will go home with home health PT.  Discharge Instructions  Discharge Instructions     Diet - low sodium heart healthy   Complete by: As directed    Increase activity slowly   Complete by: As directed       Allergies as of 06/02/2023   No Known Allergies      Medication List     TAKE these medications    amLODipine 5 MG tablet Commonly known as: NORVASC Take 5 mg by mouth in the morning.   atorvastatin 10 MG tablet Commonly known as: LIPITOR Take 10 mg by mouth every evening.   Bayer Low Dose 81 MG tablet Generic drug: aspirin EC Take 81 mg by mouth daily. Swallow whole.   buPROPion 150 MG 24 hr tablet Commonly known as: WELLBUTRIN XL TAKE 1 TABLET(150 MG) BY MOUTH DAILY What changed:  how much to take how to take this when to take this additional instructions   ciprofloxacin 500 MG tablet Commonly known as: CIPRO Take 1 tablet (500 mg total) by mouth 2 (two) times daily for 2 days.   DULoxetine 60 MG capsule Commonly known as: CYMBALTA Take 60 mg by mouth every evening.   memantine 10 MG tablet Commonly known as: NAMENDA Take 1 tablet (10 mg total) by mouth 2 (two) times daily.   Myrbetriq 50 MG Tb24 tablet Generic drug: mirabegron ER Take 50 mg by mouth daily.   oxybutynin 10 MG 24 hr tablet Commonly known as: DITROPAN-XL Take 10 mg by mouth every evening.   tamsulosin 0.4 MG Caps capsule Commonly known as: FLOMAX Take 2 capsules (0.8 mg total) by mouth daily. Start taking on: June 03, 2023 What changed:  how much to take when to take this   VITAMIN B COMPLEX PO Take 1 tablet by mouth daily with breakfast.   Vitamin D3 1000 units Caps Take 1,000 Units by mouth daily with breakfast.        Follow-up Information     [place], [facility] Follow up.   Why: [facility]   Home Health Physical Therapy and Occupational Therapy Contact  information: [address] [place] [address] [phone]                No Known Allergies  Consultations: None   Procedures/Studies: CT HEAD WO CONTRAST ( )  Result Date: 06/01/2023 CLINICAL DATA:  Follow-up subdural hematoma EXAM: CT HEAD WITHOUT CONTRAST TECHNIQUE: Contiguous axial images were obtained from the base of the skull through the vertex without intravenous contrast. RADIATION DOSE REDUCTION: This exam was performed according to the departmental dose-optimization program which includes automated exposure control, adjustment of the mA and/or kV according to patient size and/or use of iterative reconstruction technique. COMPARISON:  05/30/2024 FINDINGS: Brain: Age related volume loss. Old small vessel infarctions of the brainstem, cerebellum, thalami and hemispheric white matter. No sign of acute infarction. Resolution of the previously seen right lateral convexity subdural hematoma. No new or additional bleeding. No hydrocephalus. Vascular: There is atherosclerotic calcification of the major vessels at the base of the brain. Skull: No skull fracture. Sinuses/Orbits: Clear/normal Other: Right frontal scalp swelling is diminished. IMPRESSION: 1. Resolution of the previously seen right lateral convexity subdural hematoma. No new or additional bleeding. 2. Age related volume loss. Old small vessel infarctions of the brainstem, cerebellum, thalami and hemispheric white matter. Electronically Signed   By: Paulina Fusi M.D.   On: 06/01/2023 12:37   CT HEAD WO CONTRAST ( )  Result Date: 05/31/2023 CLINICAL DATA:  Head trauma EXAM: CT HEAD WITHOUT CONTRAST TECHNIQUE: Contiguous axial images were obtained from the base of the skull through the vertex without intravenous contrast. RADIATION DOSE REDUCTION: This exam was performed according to the departmental dose-optimization program which includes automated exposure control, adjustment of the mA and/or kV according to  patient size and/or use of iterative reconstruction technique. COMPARISON:  CT head 04/02/2004 FINDINGS: Brain: There is a 4 mm thick right frontal subdural hematoma without mass effect or midline shift. There is no other acute intracranial hemorrhage there is no evidence of acute territorial infarct. There is background parenchymal volume loss with prominence of the ventricular system and extra-axial CSF spaces. There are remote infarcts in the thalami, left larger than right, and right cerebellar hemisphere. Patchy hypodensity in the supratentorial white matter likely reflects sequela of underlying chronic small-vessel ischemic change. The pituitary and suprasellar region are normal. There is no mass lesion. There is no mass effect or midline shift. Vascular: There is calcification of the bilateral carotid siphons. Skull: Normal. Negative for fracture or focal lesion. Sinuses/Orbits: The imaged paranasal sinuses are clear. The globes and orbits are unremarkable. Other: There is mild right frontal scalp swelling. IMPRESSION: 1. 4 mm thick acute right frontal subdural hematoma without mass effect or midline shift. 2. Mild right frontal scalp swelling without underlying calvarial fracture. Critical Value/emergent results were called by telephone at the time of interpretation on 05/31/2023 at 11:09 am to provider [facility] , who verbally acknowledged these results. Electronically Signed   By: Lesia Hausen M.D.   On: 05/31/2023 11:09   CT ABDOMEN PELVIS WO CONTRAST  Result Date:  05/29/2023 CLINICAL DATA:  Abdominal pain, acute, nonlocalized Low back/ abdominal pain. Hematuria. Hx of UTI EXAM: CT ABDOMEN AND PELVIS WITHOUT CONTRAST TECHNIQUE: Multidetector CT imaging of the abdomen and pelvis was performed following the standard protocol without IV contrast. RADIATION DOSE REDUCTION: This exam was performed according to the departmental dose-optimization program which includes automated exposure control,  adjustment of the mA and/or kV according to patient size and/or use of iterative reconstruction technique. COMPARISON:  None Available. FINDINGS: Lower chest: Coronary artery calcification. Patulous distal esophagus with PO contrast noted within the lumen of the esophagus. Bilateral lower lobe atelectasis. Hepatobiliary: No focal liver abnormality. No gallstones, gallbladder wall thickening, or pericholecystic fluid. No biliary dilatation. Pancreas: No focal lesion. Normal pancreatic contour. No surrounding inflammatory changes. No main pancreatic ductal dilatation. Spleen: Normal in size without focal abnormality. Adrenals/Urinary Tract: No adrenal nodule bilaterally. Punctate right nephrolithiasis. No left nephrolithiasis. No hydronephrosis. Fluid density lesions likely represent simple renal cysts. Simple renal cysts, in the absence of clinically indicated signs/symptoms, require no independent follow-up. No ureterolithiasis or hydroureter. Circumferential urinary bladder wall thickening. Stomach/Bowel: Stomach is within normal limits. No evidence of bowel wall thickening or dilatation. The appendix is not definitely identified with no inflammatory changes in the right lower quadrant to suggest acute appendicitis. Vascular/Lymphatic: No abdominal aorta or iliac aneurysm. Severe atherosclerotic plaque of the aorta and its branches. No abdominal, pelvic, or inguinal lymphadenopathy. Reproductive: Prostate is unremarkable. Other: No intraperitoneal free fluid. No intraperitoneal free gas. No organized fluid collection. Musculoskeletal: No abdominal wall hernia or abnormality. No suspicious lytic or blastic osseous lesions. No acute displaced fracture. Screw fixation of a proximal left femoral fracture. IMPRESSION: 1. Likely cystitis.  Correlate with urinalysis. 2. Patulous distal esophagus with PO contrast noted within the lumen of the esophagus. Correlate with signs and symptoms of GERD. 3. Nonobstructive punctate  right nephrolithiasis. 4.  Aortic Atherosclerosis (ICD10-I70.0). Electronically Signed   By: Tish Frederickson M.D.   On: 05/29/2023 22:47   DG Chest Port 1 View  Result Date: 05/29/2023 CLINICAL DATA:  Fever.  History of dementia. EXAM: PORTABLE CHEST 1 VIEW COMPARISON:  06/20/2019 FINDINGS: Lungs are adequately inflated and otherwise clear. Cardiomediastinal silhouette and remainder of the exam is unchanged. IMPRESSION: No active disease. Electronically Signed   By: Elberta Fortis M.D.   On: 05/29/2023 14:59   (Echo, Carotid, EGD, Colonoscopy, ERCP)    Subjective: No complaints  Discharge Exam: Vitals:   06/01/23 2015 06/02/23 0516  BP: 131/78 132/78  Pulse: 74 73  Resp: 18 17  Temp: 97.7 F (36.5 C) 98.2 F (36.8 C)  SpO2: 95% 95%   Vitals:   06/01/23 0538 06/01/23 1343 06/01/23 2015 06/02/23 0516  BP: 131/65 119/71 131/78 132/78  Pulse: 68 79 74 73  Resp: 19 16 18 17   Temp: 98.7 F (37.1 C) 98 F (36.7 C) 97.7 F (36.5 C) 98.2 F (36.8 C)  TempSrc: Oral Oral Oral Oral  SpO2: 94% 95% 95% 95%  Weight:      Height:        General: Pt is alert, awake, not in acute distress Cardiovascular: RRR, S1/S2 +, no rubs, no gallops Respiratory: CTA bilaterally, no wheezing, no rhonchi Abdominal: Soft, NT, ND, bowel sounds + Extremities: no edema, no cyanosis    The results of significant diagnostics from this hospitalization (including imaging, microbiology, ancillary and laboratory) are listed below for reference.     Microbiology: Recent Results (from the past 240 hour(s))  Blood Culture (routine  x 2)     Status: None (Preliminary result)   Collection Time: 05/29/23  2:15 PM   Specimen: BLOOD  Result Value Ref Range Status   Specimen Description   Final    BLOOD RIGHT ANTECUBITAL Performed at [facility], [address], [place], [place] [address]    Special Requests   Final    BOTTLES DRAWN AEROBIC ONLY Blood Culture results may not be optimal due to an  inadequate volume of blood received in culture bottles Performed at [facility], [address] [place], [place] [address]    Culture   Final    NO GROWTH 4 DAYS Performed at [facility], [address] [place], [place] [address]    Report Status PENDING  Incomplete  Blood Culture (routine x 2)     Status: None (Preliminary result)   Collection Time: 05/29/23  2:20 PM   Specimen: BLOOD RIGHT FOREARM  Result Value Ref Range Status   Specimen Description   Final    BLOOD RIGHT FOREARM Performed at [facility], [address], [place], [place] [address]    Special Requests   Final    BOTTLES DRAWN AEROBIC AND ANAEROBIC Blood Culture results may not be optimal due to an inadequate volume of blood received in culture bottles Performed at [facility], [address] [place], [place] [address]    Culture   Final    NO GROWTH 4 DAYS Performed at [facility], [address] [place], [place] [address]    Report Status PENDING  Incomplete  Urine Culture     Status: Abnormal   Collection Time: 05/29/23  3:51 PM   Specimen: Urine, Random  Result Value Ref Range Status   Specimen Description   Final    URINE, RANDOM Performed at [facility], [address], [place], [place] [address]    Special Requests   Final    NONE Reflexed from [phone] [id number] Performed at [facility], [address], [place], [place] [address]    Culture >=100,000 COLONIES/mL PSEUDOMONAS AERUGINOSA (A)  Final   Report Status 05/31/2023 FINAL  Final   Organism ID, Bacteria PSEUDOMONAS AERUGINOSA (A)  Final      Susceptibility   Pseudomonas aeruginosa - MIC*    CEFTAZIDIME <=1 SENSITIVE Sensitive     CIPROFLOXACIN <=0.25 SENSITIVE Sensitive     GENTAMICIN <=1 SENSITIVE Sensitive     IMIPENEM <=0.25 SENSITIVE Sensitive     PIP/TAZO <=4 SENSITIVE Sensitive     CEFEPIME 0.25 SENSITIVE Sensitive     *  >=100,000 COLONIES/mL PSEUDOMONAS AERUGINOSA     Labs: BNP (last 3 results) No results for input(s): "BNP" in the last 8760 hours. Basic Metabolic Panel: Recent Labs  Lab 05/29/23 1430 05/30/23 0444 05/31/23 0452 06/01/23 0459 06/02/23 0433  NA 138 139 140 137 135  K 4.2 3.9 3.5 4.7 3.7  CL 106 105 108 108 103  CO2 23 25 23 23 24   GLUCOSE 108* 88 98 96 100*  BUN 18 17 14 12 16   CREATININE 1.62* 1.22 1.06 1.22 1.18  CALCIUM 8.9 8.6* 8.7* 8.5* 8.5*   Liver Function Tests: Recent Labs  Lab 05/29/23 1430  AST 36  ALT 23  ALKPHOS 80  BILITOT 1.6*  PROT 6.5  ALBUMIN 3.4*   No results for input(s): "LIPASE", "AMYLASE" in the last 168 hours. No results for input(s): "AMMONIA" in the last 168  hours. CBC: Recent Labs  Lab 05/29/23 1430 05/30/23 0824 05/31/23 0452 06/01/23 0459 06/02/23 0433  WBC 26.0* 14.1* 9.5 9.1 7.2  NEUTROABS 23.3*  --  6.9 6.3 4.5  HGB 14.3 12.5* 12.1* 12.7* 12.5*  HCT 44.0 39.9 37.8* 39.1 39.2  MCV 98.2 99.5 99.5 97.0 97.3  PLT 272 218 227 261 260   Cardiac Enzymes: No results for input(s): "CKTOTAL", "CKMB", "CKMBINDEX", "TROPONINI" in the last 168 hours. BNP: Invalid input(s): "POCBNP" CBG: No results for input(s): "GLUCAP" in the last 168 hours. D-Dimer No results for input(s): "DDIMER" in the last 72 hours. Hgb A1c No results for input(s): "HGBA1C" in the last 72 hours. Lipid Profile No results for input(s): "CHOL", "HDL", "LDLCALC", "TRIG", "CHOLHDL", "LDLDIRECT" in the last 72 hours. Thyroid function studies No results for input(s): "TSH", "T4TOTAL", "T3FREE", "THYROIDAB" in the last 72 hours.  Invalid input(s): "FREET3" Anemia work up No results for input(s): "VITAMINB12", "FOLATE", "FERRITIN", "TIBC", "IRON", "RETICCTPCT" in the last 72 hours. Urinalysis    Component Value Date/Time   COLORURINE YELLOW 05/29/2023 1551   APPEARANCEUR HAZY (A) 05/29/2023 1551   LABSPEC 1.011 05/29/2023 1551   PHURINE 6.0 05/29/2023 1551    GLUCOSEU NEGATIVE 05/29/2023 1551   HGBUR MODERATE (A) 05/29/2023 1551   BILIRUBINUR NEGATIVE 05/29/2023 1551   KETONESUR NEGATIVE 05/29/2023 1551   PROTEINUR NEGATIVE 05/29/2023 1551   NITRITE NEGATIVE 05/29/2023 1551   LEUKOCYTESUR MODERATE (A) 05/29/2023 1551   Sepsis Labs Recent Labs  Lab 05/30/23 0824 05/31/23 0452 06/01/23 0459 06/02/23 0433  WBC 14.1* 9.5 9.1 7.2   Microbiology Recent Results (from the past 240 hour(s))  Blood Culture (routine x 2)     Status: None (Preliminary result)   Collection Time: 05/29/23  2:15 PM   Specimen: BLOOD  Result Value Ref Range Status   Specimen Description   Final    BLOOD RIGHT ANTECUBITAL Performed at [facility], [address] [place], [place] [address]    Special Requests   Final    BOTTLES DRAWN AEROBIC ONLY Blood Culture results may not be optimal due to an inadequate volume of blood received in culture bottles Performed at [facility], [address], [place], [place] [address]    Culture   Final    NO GROWTH 4 DAYS Performed at [facility], [address], [place], [place] [address]    Report Status PENDING  Incomplete  Blood Culture (routine x 2)     Status: None (Preliminary result)   Collection Time: 05/29/23  2:20 PM   Specimen: BLOOD RIGHT FOREARM  Result Value Ref Range Status   Specimen Description   Final    BLOOD RIGHT FOREARM Performed at [facility], [address], [place], [place] [address]    Special Requests   Final    BOTTLES DRAWN AEROBIC AND ANAEROBIC Blood Culture results may not be optimal due to an inadequate volume of blood received in culture bottles Performed at [facility], [address], [place], [place] [address]    Culture   Final    NO GROWTH 4 DAYS Performed at [facility], [address], [place], [place] [address]    Report Status PENDING  Incomplete  Urine Culture     Status: Abnormal   Collection  Time: 05/29/23  3:51 PM   Specimen: Urine, Random  Result Value Ref Range Status   Specimen Description   Final    URINE,  RANDOM Performed at [facility], [address], [place], [place] [address]    Special Requests   Final    NONE Reflexed from [phone] [phone] Performed at [facility], [address], [place], [place] [address]    Culture >=100,000 COLONIES/mL PSEUDOMONAS AERUGINOSA (A)  Final   Report Status 05/31/2023 FINAL  Final   Organism ID, Bacteria PSEUDOMONAS AERUGINOSA (A)  Final      Susceptibility   Pseudomonas aeruginosa - MIC*    CEFTAZIDIME <=1 SENSITIVE Sensitive     CIPROFLOXACIN <=0.25 SENSITIVE Sensitive     GENTAMICIN <=1 SENSITIVE Sensitive     IMIPENEM <=0.25 SENSITIVE Sensitive     PIP/TAZO <=4 SENSITIVE Sensitive     CEFEPIME 0.25 SENSITIVE Sensitive     * >=100,000 COLONIES/mL PSEUDOMONAS AERUGINOSA     Time coordinating discharge: Over 35 minutes  SIGNED:   Marinda Elk, MD  [facility] 06/02/2023, 9:57 AM Pager   If 7PM-7AM, please contact night-coverage [email or web address] Password [email or web address]

## 2023-06-03 DIAGNOSIS — N1832 Chronic kidney disease, stage 3b: Secondary | ICD-10-CM | POA: Diagnosis not present

## 2023-06-03 DIAGNOSIS — I129 Hypertensive chronic kidney disease with stage 1 through stage 4 chronic kidney disease, or unspecified chronic kidney disease: Secondary | ICD-10-CM | POA: Diagnosis not present

## 2023-06-03 DIAGNOSIS — Z604 Social exclusion and rejection: Secondary | ICD-10-CM | POA: Diagnosis not present

## 2023-06-03 DIAGNOSIS — S065X0D Traumatic subdural hemorrhage without loss of consciousness, subsequent encounter: Secondary | ICD-10-CM | POA: Diagnosis not present

## 2023-06-03 DIAGNOSIS — Z7982 Long term (current) use of aspirin: Secondary | ICD-10-CM | POA: Diagnosis not present

## 2023-06-03 DIAGNOSIS — A419 Sepsis, unspecified organism: Secondary | ICD-10-CM | POA: Diagnosis not present

## 2023-06-03 DIAGNOSIS — I69351 Hemiplegia and hemiparesis following cerebral infarction affecting right dominant side: Secondary | ICD-10-CM | POA: Diagnosis not present

## 2023-06-03 DIAGNOSIS — I700 Atherosclerosis of aorta: Secondary | ICD-10-CM | POA: Diagnosis not present

## 2023-06-03 DIAGNOSIS — Z9181 History of falling: Secondary | ICD-10-CM | POA: Diagnosis not present

## 2023-06-03 DIAGNOSIS — A4152 Sepsis due to Pseudomonas: Secondary | ICD-10-CM | POA: Diagnosis not present

## 2023-06-03 DIAGNOSIS — G4733 Obstructive sleep apnea (adult) (pediatric): Secondary | ICD-10-CM | POA: Diagnosis not present

## 2023-06-03 DIAGNOSIS — N390 Urinary tract infection, site not specified: Secondary | ICD-10-CM | POA: Diagnosis not present

## 2023-06-06 DIAGNOSIS — R3916 Straining to void: Secondary | ICD-10-CM | POA: Diagnosis not present

## 2023-06-06 DIAGNOSIS — N3020 Other chronic cystitis without hematuria: Secondary | ICD-10-CM | POA: Diagnosis not present

## 2023-06-07 DIAGNOSIS — N3020 Other chronic cystitis without hematuria: Secondary | ICD-10-CM | POA: Diagnosis not present

## 2023-06-08 DIAGNOSIS — Z9181 History of falling: Secondary | ICD-10-CM | POA: Diagnosis not present

## 2023-06-08 DIAGNOSIS — Z604 Social exclusion and rejection: Secondary | ICD-10-CM | POA: Diagnosis not present

## 2023-06-08 DIAGNOSIS — N1832 Chronic kidney disease, stage 3b: Secondary | ICD-10-CM | POA: Diagnosis not present

## 2023-06-08 DIAGNOSIS — S065X0D Traumatic subdural hemorrhage without loss of consciousness, subsequent encounter: Secondary | ICD-10-CM | POA: Diagnosis not present

## 2023-06-08 DIAGNOSIS — I69351 Hemiplegia and hemiparesis following cerebral infarction affecting right dominant side: Secondary | ICD-10-CM | POA: Diagnosis not present

## 2023-06-08 DIAGNOSIS — I129 Hypertensive chronic kidney disease with stage 1 through stage 4 chronic kidney disease, or unspecified chronic kidney disease: Secondary | ICD-10-CM | POA: Diagnosis not present

## 2023-06-08 DIAGNOSIS — Z7982 Long term (current) use of aspirin: Secondary | ICD-10-CM | POA: Diagnosis not present

## 2023-06-08 DIAGNOSIS — A419 Sepsis, unspecified organism: Secondary | ICD-10-CM | POA: Diagnosis not present

## 2023-06-08 DIAGNOSIS — A4152 Sepsis due to Pseudomonas: Secondary | ICD-10-CM | POA: Diagnosis not present

## 2023-06-08 DIAGNOSIS — G4733 Obstructive sleep apnea (adult) (pediatric): Secondary | ICD-10-CM | POA: Diagnosis not present

## 2023-06-08 DIAGNOSIS — I700 Atherosclerosis of aorta: Secondary | ICD-10-CM | POA: Diagnosis not present

## 2023-06-08 DIAGNOSIS — N390 Urinary tract infection, site not specified: Secondary | ICD-10-CM | POA: Diagnosis not present

## 2023-06-09 DIAGNOSIS — Z9181 History of falling: Secondary | ICD-10-CM | POA: Diagnosis not present

## 2023-06-09 DIAGNOSIS — N1832 Chronic kidney disease, stage 3b: Secondary | ICD-10-CM | POA: Diagnosis not present

## 2023-06-09 DIAGNOSIS — Z7982 Long term (current) use of aspirin: Secondary | ICD-10-CM | POA: Diagnosis not present

## 2023-06-09 DIAGNOSIS — Z604 Social exclusion and rejection: Secondary | ICD-10-CM | POA: Diagnosis not present

## 2023-06-09 DIAGNOSIS — I69351 Hemiplegia and hemiparesis following cerebral infarction affecting right dominant side: Secondary | ICD-10-CM | POA: Diagnosis not present

## 2023-06-09 DIAGNOSIS — I700 Atherosclerosis of aorta: Secondary | ICD-10-CM | POA: Diagnosis not present

## 2023-06-09 DIAGNOSIS — I129 Hypertensive chronic kidney disease with stage 1 through stage 4 chronic kidney disease, or unspecified chronic kidney disease: Secondary | ICD-10-CM | POA: Diagnosis not present

## 2023-06-09 DIAGNOSIS — S065X0D Traumatic subdural hemorrhage without loss of consciousness, subsequent encounter: Secondary | ICD-10-CM | POA: Diagnosis not present

## 2023-06-09 DIAGNOSIS — A419 Sepsis, unspecified organism: Secondary | ICD-10-CM | POA: Diagnosis not present

## 2023-06-09 DIAGNOSIS — N390 Urinary tract infection, site not specified: Secondary | ICD-10-CM | POA: Diagnosis not present

## 2023-06-09 DIAGNOSIS — G4733 Obstructive sleep apnea (adult) (pediatric): Secondary | ICD-10-CM | POA: Diagnosis not present

## 2023-06-09 DIAGNOSIS — A4152 Sepsis due to Pseudomonas: Secondary | ICD-10-CM | POA: Diagnosis not present

## 2023-06-10 DIAGNOSIS — Z604 Social exclusion and rejection: Secondary | ICD-10-CM | POA: Diagnosis not present

## 2023-06-10 DIAGNOSIS — S065X0D Traumatic subdural hemorrhage without loss of consciousness, subsequent encounter: Secondary | ICD-10-CM | POA: Diagnosis not present

## 2023-06-10 DIAGNOSIS — A4152 Sepsis due to Pseudomonas: Secondary | ICD-10-CM | POA: Diagnosis not present

## 2023-06-10 DIAGNOSIS — A419 Sepsis, unspecified organism: Secondary | ICD-10-CM | POA: Diagnosis not present

## 2023-06-10 DIAGNOSIS — Z9181 History of falling: Secondary | ICD-10-CM | POA: Diagnosis not present

## 2023-06-10 DIAGNOSIS — I700 Atherosclerosis of aorta: Secondary | ICD-10-CM | POA: Diagnosis not present

## 2023-06-10 DIAGNOSIS — Z7982 Long term (current) use of aspirin: Secondary | ICD-10-CM | POA: Diagnosis not present

## 2023-06-10 DIAGNOSIS — G4733 Obstructive sleep apnea (adult) (pediatric): Secondary | ICD-10-CM | POA: Diagnosis not present

## 2023-06-10 DIAGNOSIS — I129 Hypertensive chronic kidney disease with stage 1 through stage 4 chronic kidney disease, or unspecified chronic kidney disease: Secondary | ICD-10-CM | POA: Diagnosis not present

## 2023-06-10 DIAGNOSIS — N390 Urinary tract infection, site not specified: Secondary | ICD-10-CM | POA: Diagnosis not present

## 2023-06-10 DIAGNOSIS — I69351 Hemiplegia and hemiparesis following cerebral infarction affecting right dominant side: Secondary | ICD-10-CM | POA: Diagnosis not present

## 2023-06-10 DIAGNOSIS — N1832 Chronic kidney disease, stage 3b: Secondary | ICD-10-CM | POA: Diagnosis not present

## 2023-06-13 DIAGNOSIS — I69351 Hemiplegia and hemiparesis following cerebral infarction affecting right dominant side: Secondary | ICD-10-CM | POA: Diagnosis not present

## 2023-06-13 DIAGNOSIS — Z7982 Long term (current) use of aspirin: Secondary | ICD-10-CM | POA: Diagnosis not present

## 2023-06-13 DIAGNOSIS — S065X0D Traumatic subdural hemorrhage without loss of consciousness, subsequent encounter: Secondary | ICD-10-CM | POA: Diagnosis not present

## 2023-06-13 DIAGNOSIS — N390 Urinary tract infection, site not specified: Secondary | ICD-10-CM | POA: Diagnosis not present

## 2023-06-13 DIAGNOSIS — I700 Atherosclerosis of aorta: Secondary | ICD-10-CM | POA: Diagnosis not present

## 2023-06-13 DIAGNOSIS — G4733 Obstructive sleep apnea (adult) (pediatric): Secondary | ICD-10-CM | POA: Diagnosis not present

## 2023-06-13 DIAGNOSIS — N1832 Chronic kidney disease, stage 3b: Secondary | ICD-10-CM | POA: Diagnosis not present

## 2023-06-13 DIAGNOSIS — A419 Sepsis, unspecified organism: Secondary | ICD-10-CM | POA: Diagnosis not present

## 2023-06-13 DIAGNOSIS — I129 Hypertensive chronic kidney disease with stage 1 through stage 4 chronic kidney disease, or unspecified chronic kidney disease: Secondary | ICD-10-CM | POA: Diagnosis not present

## 2023-06-13 DIAGNOSIS — Z604 Social exclusion and rejection: Secondary | ICD-10-CM | POA: Diagnosis not present

## 2023-06-13 DIAGNOSIS — Z9181 History of falling: Secondary | ICD-10-CM | POA: Diagnosis not present

## 2023-06-13 DIAGNOSIS — A4152 Sepsis due to Pseudomonas: Secondary | ICD-10-CM | POA: Diagnosis not present

## 2023-06-15 DIAGNOSIS — G4733 Obstructive sleep apnea (adult) (pediatric): Secondary | ICD-10-CM | POA: Diagnosis not present

## 2023-06-15 DIAGNOSIS — Z604 Social exclusion and rejection: Secondary | ICD-10-CM | POA: Diagnosis not present

## 2023-06-15 DIAGNOSIS — I700 Atherosclerosis of aorta: Secondary | ICD-10-CM | POA: Diagnosis not present

## 2023-06-15 DIAGNOSIS — N390 Urinary tract infection, site not specified: Secondary | ICD-10-CM | POA: Diagnosis not present

## 2023-06-15 DIAGNOSIS — A4152 Sepsis due to Pseudomonas: Secondary | ICD-10-CM | POA: Diagnosis not present

## 2023-06-15 DIAGNOSIS — A419 Sepsis, unspecified organism: Secondary | ICD-10-CM | POA: Diagnosis not present

## 2023-06-15 DIAGNOSIS — Z7982 Long term (current) use of aspirin: Secondary | ICD-10-CM | POA: Diagnosis not present

## 2023-06-15 DIAGNOSIS — I129 Hypertensive chronic kidney disease with stage 1 through stage 4 chronic kidney disease, or unspecified chronic kidney disease: Secondary | ICD-10-CM | POA: Diagnosis not present

## 2023-06-15 DIAGNOSIS — I69351 Hemiplegia and hemiparesis following cerebral infarction affecting right dominant side: Secondary | ICD-10-CM | POA: Diagnosis not present

## 2023-06-15 DIAGNOSIS — Z9181 History of falling: Secondary | ICD-10-CM | POA: Diagnosis not present

## 2023-06-15 DIAGNOSIS — N1832 Chronic kidney disease, stage 3b: Secondary | ICD-10-CM | POA: Diagnosis not present

## 2023-06-15 DIAGNOSIS — S065X0D Traumatic subdural hemorrhage without loss of consciousness, subsequent encounter: Secondary | ICD-10-CM | POA: Diagnosis not present

## 2023-06-16 DIAGNOSIS — S065X0D Traumatic subdural hemorrhage without loss of consciousness, subsequent encounter: Secondary | ICD-10-CM | POA: Diagnosis not present

## 2023-06-16 DIAGNOSIS — A4152 Sepsis due to Pseudomonas: Secondary | ICD-10-CM | POA: Diagnosis not present

## 2023-06-16 DIAGNOSIS — A419 Sepsis, unspecified organism: Secondary | ICD-10-CM | POA: Diagnosis not present

## 2023-06-16 DIAGNOSIS — I69351 Hemiplegia and hemiparesis following cerebral infarction affecting right dominant side: Secondary | ICD-10-CM | POA: Diagnosis not present

## 2023-06-16 DIAGNOSIS — N390 Urinary tract infection, site not specified: Secondary | ICD-10-CM | POA: Diagnosis not present

## 2023-06-16 DIAGNOSIS — I129 Hypertensive chronic kidney disease with stage 1 through stage 4 chronic kidney disease, or unspecified chronic kidney disease: Secondary | ICD-10-CM | POA: Diagnosis not present

## 2023-06-16 DIAGNOSIS — Z604 Social exclusion and rejection: Secondary | ICD-10-CM | POA: Diagnosis not present

## 2023-06-16 DIAGNOSIS — N1832 Chronic kidney disease, stage 3b: Secondary | ICD-10-CM | POA: Diagnosis not present

## 2023-06-16 DIAGNOSIS — Z9181 History of falling: Secondary | ICD-10-CM | POA: Diagnosis not present

## 2023-06-16 DIAGNOSIS — Z7982 Long term (current) use of aspirin: Secondary | ICD-10-CM | POA: Diagnosis not present

## 2023-06-16 DIAGNOSIS — I700 Atherosclerosis of aorta: Secondary | ICD-10-CM | POA: Diagnosis not present

## 2023-06-16 DIAGNOSIS — G4733 Obstructive sleep apnea (adult) (pediatric): Secondary | ICD-10-CM | POA: Diagnosis not present

## 2023-06-21 DIAGNOSIS — G4733 Obstructive sleep apnea (adult) (pediatric): Secondary | ICD-10-CM | POA: Diagnosis not present

## 2023-06-21 DIAGNOSIS — Z604 Social exclusion and rejection: Secondary | ICD-10-CM | POA: Diagnosis not present

## 2023-06-21 DIAGNOSIS — Z7982 Long term (current) use of aspirin: Secondary | ICD-10-CM | POA: Diagnosis not present

## 2023-06-21 DIAGNOSIS — I69351 Hemiplegia and hemiparesis following cerebral infarction affecting right dominant side: Secondary | ICD-10-CM | POA: Diagnosis not present

## 2023-06-21 DIAGNOSIS — A4152 Sepsis due to Pseudomonas: Secondary | ICD-10-CM | POA: Diagnosis not present

## 2023-06-21 DIAGNOSIS — A419 Sepsis, unspecified organism: Secondary | ICD-10-CM | POA: Diagnosis not present

## 2023-06-21 DIAGNOSIS — N390 Urinary tract infection, site not specified: Secondary | ICD-10-CM | POA: Diagnosis not present

## 2023-06-21 DIAGNOSIS — S065X0D Traumatic subdural hemorrhage without loss of consciousness, subsequent encounter: Secondary | ICD-10-CM | POA: Diagnosis not present

## 2023-06-21 DIAGNOSIS — I700 Atherosclerosis of aorta: Secondary | ICD-10-CM | POA: Diagnosis not present

## 2023-06-21 DIAGNOSIS — I129 Hypertensive chronic kidney disease with stage 1 through stage 4 chronic kidney disease, or unspecified chronic kidney disease: Secondary | ICD-10-CM | POA: Diagnosis not present

## 2023-06-21 DIAGNOSIS — Z9181 History of falling: Secondary | ICD-10-CM | POA: Diagnosis not present

## 2023-06-21 DIAGNOSIS — N1832 Chronic kidney disease, stage 3b: Secondary | ICD-10-CM | POA: Diagnosis not present

## 2023-06-22 DIAGNOSIS — I700 Atherosclerosis of aorta: Secondary | ICD-10-CM | POA: Diagnosis not present

## 2023-06-22 DIAGNOSIS — N390 Urinary tract infection, site not specified: Secondary | ICD-10-CM | POA: Diagnosis not present

## 2023-06-22 DIAGNOSIS — Z9181 History of falling: Secondary | ICD-10-CM | POA: Diagnosis not present

## 2023-06-22 DIAGNOSIS — I69351 Hemiplegia and hemiparesis following cerebral infarction affecting right dominant side: Secondary | ICD-10-CM | POA: Diagnosis not present

## 2023-06-22 DIAGNOSIS — A419 Sepsis, unspecified organism: Secondary | ICD-10-CM | POA: Diagnosis not present

## 2023-06-22 DIAGNOSIS — Z7982 Long term (current) use of aspirin: Secondary | ICD-10-CM | POA: Diagnosis not present

## 2023-06-22 DIAGNOSIS — Z604 Social exclusion and rejection: Secondary | ICD-10-CM | POA: Diagnosis not present

## 2023-06-22 DIAGNOSIS — N1832 Chronic kidney disease, stage 3b: Secondary | ICD-10-CM | POA: Diagnosis not present

## 2023-06-22 DIAGNOSIS — G4733 Obstructive sleep apnea (adult) (pediatric): Secondary | ICD-10-CM | POA: Diagnosis not present

## 2023-06-22 DIAGNOSIS — A4152 Sepsis due to Pseudomonas: Secondary | ICD-10-CM | POA: Diagnosis not present

## 2023-06-22 DIAGNOSIS — I129 Hypertensive chronic kidney disease with stage 1 through stage 4 chronic kidney disease, or unspecified chronic kidney disease: Secondary | ICD-10-CM | POA: Diagnosis not present

## 2023-06-22 DIAGNOSIS — S065X0D Traumatic subdural hemorrhage without loss of consciousness, subsequent encounter: Secondary | ICD-10-CM | POA: Diagnosis not present

## 2023-06-23 DIAGNOSIS — N390 Urinary tract infection, site not specified: Secondary | ICD-10-CM | POA: Diagnosis not present

## 2023-06-23 DIAGNOSIS — G4733 Obstructive sleep apnea (adult) (pediatric): Secondary | ICD-10-CM | POA: Diagnosis not present

## 2023-06-23 DIAGNOSIS — N1832 Chronic kidney disease, stage 3b: Secondary | ICD-10-CM | POA: Diagnosis not present

## 2023-06-23 DIAGNOSIS — I129 Hypertensive chronic kidney disease with stage 1 through stage 4 chronic kidney disease, or unspecified chronic kidney disease: Secondary | ICD-10-CM | POA: Diagnosis not present

## 2023-06-23 DIAGNOSIS — A419 Sepsis, unspecified organism: Secondary | ICD-10-CM | POA: Diagnosis not present

## 2023-06-23 DIAGNOSIS — Z7982 Long term (current) use of aspirin: Secondary | ICD-10-CM | POA: Diagnosis not present

## 2023-06-23 DIAGNOSIS — I69351 Hemiplegia and hemiparesis following cerebral infarction affecting right dominant side: Secondary | ICD-10-CM | POA: Diagnosis not present

## 2023-06-23 DIAGNOSIS — I700 Atherosclerosis of aorta: Secondary | ICD-10-CM | POA: Diagnosis not present

## 2023-06-23 DIAGNOSIS — Z604 Social exclusion and rejection: Secondary | ICD-10-CM | POA: Diagnosis not present

## 2023-06-23 DIAGNOSIS — S065X0D Traumatic subdural hemorrhage without loss of consciousness, subsequent encounter: Secondary | ICD-10-CM | POA: Diagnosis not present

## 2023-06-23 DIAGNOSIS — Z9181 History of falling: Secondary | ICD-10-CM | POA: Diagnosis not present

## 2023-06-23 DIAGNOSIS — A4152 Sepsis due to Pseudomonas: Secondary | ICD-10-CM | POA: Diagnosis not present

## 2023-06-27 DIAGNOSIS — I69351 Hemiplegia and hemiparesis following cerebral infarction affecting right dominant side: Secondary | ICD-10-CM | POA: Diagnosis not present

## 2023-06-27 DIAGNOSIS — I700 Atherosclerosis of aorta: Secondary | ICD-10-CM | POA: Diagnosis not present

## 2023-06-27 DIAGNOSIS — A4152 Sepsis due to Pseudomonas: Secondary | ICD-10-CM | POA: Diagnosis not present

## 2023-06-27 DIAGNOSIS — N1832 Chronic kidney disease, stage 3b: Secondary | ICD-10-CM | POA: Diagnosis not present

## 2023-06-27 DIAGNOSIS — Z604 Social exclusion and rejection: Secondary | ICD-10-CM | POA: Diagnosis not present

## 2023-06-27 DIAGNOSIS — G4733 Obstructive sleep apnea (adult) (pediatric): Secondary | ICD-10-CM | POA: Diagnosis not present

## 2023-06-27 DIAGNOSIS — S065X0D Traumatic subdural hemorrhage without loss of consciousness, subsequent encounter: Secondary | ICD-10-CM | POA: Diagnosis not present

## 2023-06-27 DIAGNOSIS — I129 Hypertensive chronic kidney disease with stage 1 through stage 4 chronic kidney disease, or unspecified chronic kidney disease: Secondary | ICD-10-CM | POA: Diagnosis not present

## 2023-06-27 DIAGNOSIS — Z7982 Long term (current) use of aspirin: Secondary | ICD-10-CM | POA: Diagnosis not present

## 2023-06-27 DIAGNOSIS — N390 Urinary tract infection, site not specified: Secondary | ICD-10-CM | POA: Diagnosis not present

## 2023-06-27 DIAGNOSIS — A419 Sepsis, unspecified organism: Secondary | ICD-10-CM | POA: Diagnosis not present

## 2023-06-27 DIAGNOSIS — Z9181 History of falling: Secondary | ICD-10-CM | POA: Diagnosis not present

## 2023-06-28 DIAGNOSIS — A4152 Sepsis due to Pseudomonas: Secondary | ICD-10-CM | POA: Diagnosis not present

## 2023-06-28 DIAGNOSIS — Z9181 History of falling: Secondary | ICD-10-CM | POA: Diagnosis not present

## 2023-06-28 DIAGNOSIS — I129 Hypertensive chronic kidney disease with stage 1 through stage 4 chronic kidney disease, or unspecified chronic kidney disease: Secondary | ICD-10-CM | POA: Diagnosis not present

## 2023-06-28 DIAGNOSIS — A419 Sepsis, unspecified organism: Secondary | ICD-10-CM | POA: Diagnosis not present

## 2023-06-28 DIAGNOSIS — N390 Urinary tract infection, site not specified: Secondary | ICD-10-CM | POA: Diagnosis not present

## 2023-06-28 DIAGNOSIS — I69351 Hemiplegia and hemiparesis following cerebral infarction affecting right dominant side: Secondary | ICD-10-CM | POA: Diagnosis not present

## 2023-06-28 DIAGNOSIS — I700 Atherosclerosis of aorta: Secondary | ICD-10-CM | POA: Diagnosis not present

## 2023-06-28 DIAGNOSIS — S065X0D Traumatic subdural hemorrhage without loss of consciousness, subsequent encounter: Secondary | ICD-10-CM | POA: Diagnosis not present

## 2023-06-28 DIAGNOSIS — Z7982 Long term (current) use of aspirin: Secondary | ICD-10-CM | POA: Diagnosis not present

## 2023-06-28 DIAGNOSIS — G4733 Obstructive sleep apnea (adult) (pediatric): Secondary | ICD-10-CM | POA: Diagnosis not present

## 2023-06-28 DIAGNOSIS — Z604 Social exclusion and rejection: Secondary | ICD-10-CM | POA: Diagnosis not present

## 2023-06-28 DIAGNOSIS — N1832 Chronic kidney disease, stage 3b: Secondary | ICD-10-CM | POA: Diagnosis not present

## 2023-06-29 DIAGNOSIS — A419 Sepsis, unspecified organism: Secondary | ICD-10-CM | POA: Diagnosis not present

## 2023-06-29 DIAGNOSIS — Z23 Encounter for immunization: Secondary | ICD-10-CM | POA: Diagnosis not present

## 2023-06-29 DIAGNOSIS — I69351 Hemiplegia and hemiparesis following cerebral infarction affecting right dominant side: Secondary | ICD-10-CM | POA: Diagnosis not present

## 2023-06-29 DIAGNOSIS — N1832 Chronic kidney disease, stage 3b: Secondary | ICD-10-CM | POA: Diagnosis not present

## 2023-06-29 DIAGNOSIS — Z9989 Dependence on other enabling machines and devices: Secondary | ICD-10-CM | POA: Diagnosis not present

## 2023-06-29 DIAGNOSIS — I10 Essential (primary) hypertension: Secondary | ICD-10-CM | POA: Diagnosis not present

## 2023-06-29 DIAGNOSIS — I129 Hypertensive chronic kidney disease with stage 1 through stage 4 chronic kidney disease, or unspecified chronic kidney disease: Secondary | ICD-10-CM | POA: Diagnosis not present

## 2023-06-29 DIAGNOSIS — S065X0D Traumatic subdural hemorrhage without loss of consciousness, subsequent encounter: Secondary | ICD-10-CM | POA: Diagnosis not present

## 2023-06-29 DIAGNOSIS — Z9181 History of falling: Secondary | ICD-10-CM | POA: Diagnosis not present

## 2023-06-29 DIAGNOSIS — G4733 Obstructive sleep apnea (adult) (pediatric): Secondary | ICD-10-CM | POA: Diagnosis not present

## 2023-06-29 DIAGNOSIS — Z0000 Encounter for general adult medical examination without abnormal findings: Secondary | ICD-10-CM | POA: Diagnosis not present

## 2023-06-29 DIAGNOSIS — N3020 Other chronic cystitis without hematuria: Secondary | ICD-10-CM | POA: Diagnosis not present

## 2023-06-29 DIAGNOSIS — Z604 Social exclusion and rejection: Secondary | ICD-10-CM | POA: Diagnosis not present

## 2023-06-29 DIAGNOSIS — N1831 Chronic kidney disease, stage 3a: Secondary | ICD-10-CM | POA: Diagnosis not present

## 2023-06-29 DIAGNOSIS — N390 Urinary tract infection, site not specified: Secondary | ICD-10-CM | POA: Diagnosis not present

## 2023-06-29 DIAGNOSIS — Z7982 Long term (current) use of aspirin: Secondary | ICD-10-CM | POA: Diagnosis not present

## 2023-06-29 DIAGNOSIS — E559 Vitamin D deficiency, unspecified: Secondary | ICD-10-CM | POA: Diagnosis not present

## 2023-06-29 DIAGNOSIS — G8191 Hemiplegia, unspecified affecting right dominant side: Secondary | ICD-10-CM | POA: Diagnosis not present

## 2023-06-29 DIAGNOSIS — E782 Mixed hyperlipidemia: Secondary | ICD-10-CM | POA: Diagnosis not present

## 2023-06-29 DIAGNOSIS — A4152 Sepsis due to Pseudomonas: Secondary | ICD-10-CM | POA: Diagnosis not present

## 2023-06-29 DIAGNOSIS — I700 Atherosclerosis of aorta: Secondary | ICD-10-CM | POA: Diagnosis not present

## 2023-07-04 DIAGNOSIS — A4152 Sepsis due to Pseudomonas: Secondary | ICD-10-CM | POA: Diagnosis not present

## 2023-07-04 DIAGNOSIS — Z9181 History of falling: Secondary | ICD-10-CM | POA: Diagnosis not present

## 2023-07-04 DIAGNOSIS — N1832 Chronic kidney disease, stage 3b: Secondary | ICD-10-CM | POA: Diagnosis not present

## 2023-07-04 DIAGNOSIS — S065X0D Traumatic subdural hemorrhage without loss of consciousness, subsequent encounter: Secondary | ICD-10-CM | POA: Diagnosis not present

## 2023-07-04 DIAGNOSIS — Z604 Social exclusion and rejection: Secondary | ICD-10-CM | POA: Diagnosis not present

## 2023-07-04 DIAGNOSIS — I69351 Hemiplegia and hemiparesis following cerebral infarction affecting right dominant side: Secondary | ICD-10-CM | POA: Diagnosis not present

## 2023-07-04 DIAGNOSIS — I129 Hypertensive chronic kidney disease with stage 1 through stage 4 chronic kidney disease, or unspecified chronic kidney disease: Secondary | ICD-10-CM | POA: Diagnosis not present

## 2023-07-04 DIAGNOSIS — A419 Sepsis, unspecified organism: Secondary | ICD-10-CM | POA: Diagnosis not present

## 2023-07-04 DIAGNOSIS — Z7982 Long term (current) use of aspirin: Secondary | ICD-10-CM | POA: Diagnosis not present

## 2023-07-04 DIAGNOSIS — G4733 Obstructive sleep apnea (adult) (pediatric): Secondary | ICD-10-CM | POA: Diagnosis not present

## 2023-07-04 DIAGNOSIS — I700 Atherosclerosis of aorta: Secondary | ICD-10-CM | POA: Diagnosis not present

## 2023-07-04 DIAGNOSIS — N390 Urinary tract infection, site not specified: Secondary | ICD-10-CM | POA: Diagnosis not present

## 2023-07-06 DIAGNOSIS — N390 Urinary tract infection, site not specified: Secondary | ICD-10-CM | POA: Diagnosis not present

## 2023-07-06 DIAGNOSIS — I129 Hypertensive chronic kidney disease with stage 1 through stage 4 chronic kidney disease, or unspecified chronic kidney disease: Secondary | ICD-10-CM | POA: Diagnosis not present

## 2023-07-06 DIAGNOSIS — A4152 Sepsis due to Pseudomonas: Secondary | ICD-10-CM | POA: Diagnosis not present

## 2023-07-06 DIAGNOSIS — G4733 Obstructive sleep apnea (adult) (pediatric): Secondary | ICD-10-CM | POA: Diagnosis not present

## 2023-07-06 DIAGNOSIS — Z604 Social exclusion and rejection: Secondary | ICD-10-CM | POA: Diagnosis not present

## 2023-07-06 DIAGNOSIS — I700 Atherosclerosis of aorta: Secondary | ICD-10-CM | POA: Diagnosis not present

## 2023-07-06 DIAGNOSIS — I69351 Hemiplegia and hemiparesis following cerebral infarction affecting right dominant side: Secondary | ICD-10-CM | POA: Diagnosis not present

## 2023-07-06 DIAGNOSIS — S065X0D Traumatic subdural hemorrhage without loss of consciousness, subsequent encounter: Secondary | ICD-10-CM | POA: Diagnosis not present

## 2023-07-06 DIAGNOSIS — Z9181 History of falling: Secondary | ICD-10-CM | POA: Diagnosis not present

## 2023-07-06 DIAGNOSIS — A419 Sepsis, unspecified organism: Secondary | ICD-10-CM | POA: Diagnosis not present

## 2023-07-06 DIAGNOSIS — N1832 Chronic kidney disease, stage 3b: Secondary | ICD-10-CM | POA: Diagnosis not present

## 2023-07-06 DIAGNOSIS — Z7982 Long term (current) use of aspirin: Secondary | ICD-10-CM | POA: Diagnosis not present

## 2023-07-13 DIAGNOSIS — Z9181 History of falling: Secondary | ICD-10-CM | POA: Diagnosis not present

## 2023-07-13 DIAGNOSIS — Z7982 Long term (current) use of aspirin: Secondary | ICD-10-CM | POA: Diagnosis not present

## 2023-07-13 DIAGNOSIS — I129 Hypertensive chronic kidney disease with stage 1 through stage 4 chronic kidney disease, or unspecified chronic kidney disease: Secondary | ICD-10-CM | POA: Diagnosis not present

## 2023-07-13 DIAGNOSIS — Z604 Social exclusion and rejection: Secondary | ICD-10-CM | POA: Diagnosis not present

## 2023-07-13 DIAGNOSIS — N390 Urinary tract infection, site not specified: Secondary | ICD-10-CM | POA: Diagnosis not present

## 2023-07-13 DIAGNOSIS — G4733 Obstructive sleep apnea (adult) (pediatric): Secondary | ICD-10-CM | POA: Diagnosis not present

## 2023-07-13 DIAGNOSIS — I700 Atherosclerosis of aorta: Secondary | ICD-10-CM | POA: Diagnosis not present

## 2023-07-13 DIAGNOSIS — S065X0D Traumatic subdural hemorrhage without loss of consciousness, subsequent encounter: Secondary | ICD-10-CM | POA: Diagnosis not present

## 2023-07-13 DIAGNOSIS — A4152 Sepsis due to Pseudomonas: Secondary | ICD-10-CM | POA: Diagnosis not present

## 2023-07-13 DIAGNOSIS — A419 Sepsis, unspecified organism: Secondary | ICD-10-CM | POA: Diagnosis not present

## 2023-07-13 DIAGNOSIS — N1832 Chronic kidney disease, stage 3b: Secondary | ICD-10-CM | POA: Diagnosis not present

## 2023-07-13 DIAGNOSIS — I69351 Hemiplegia and hemiparesis following cerebral infarction affecting right dominant side: Secondary | ICD-10-CM | POA: Diagnosis not present

## 2023-07-19 DIAGNOSIS — N390 Urinary tract infection, site not specified: Secondary | ICD-10-CM | POA: Diagnosis not present

## 2023-07-19 DIAGNOSIS — Z604 Social exclusion and rejection: Secondary | ICD-10-CM | POA: Diagnosis not present

## 2023-07-19 DIAGNOSIS — I700 Atherosclerosis of aorta: Secondary | ICD-10-CM | POA: Diagnosis not present

## 2023-07-19 DIAGNOSIS — Z7982 Long term (current) use of aspirin: Secondary | ICD-10-CM | POA: Diagnosis not present

## 2023-07-19 DIAGNOSIS — I129 Hypertensive chronic kidney disease with stage 1 through stage 4 chronic kidney disease, or unspecified chronic kidney disease: Secondary | ICD-10-CM | POA: Diagnosis not present

## 2023-07-19 DIAGNOSIS — Z9181 History of falling: Secondary | ICD-10-CM | POA: Diagnosis not present

## 2023-07-19 DIAGNOSIS — N1832 Chronic kidney disease, stage 3b: Secondary | ICD-10-CM | POA: Diagnosis not present

## 2023-07-19 DIAGNOSIS — S065X0D Traumatic subdural hemorrhage without loss of consciousness, subsequent encounter: Secondary | ICD-10-CM | POA: Diagnosis not present

## 2023-07-19 DIAGNOSIS — A419 Sepsis, unspecified organism: Secondary | ICD-10-CM | POA: Diagnosis not present

## 2023-07-19 DIAGNOSIS — I69351 Hemiplegia and hemiparesis following cerebral infarction affecting right dominant side: Secondary | ICD-10-CM | POA: Diagnosis not present

## 2023-07-19 DIAGNOSIS — A4152 Sepsis due to Pseudomonas: Secondary | ICD-10-CM | POA: Diagnosis not present

## 2023-07-19 DIAGNOSIS — G4733 Obstructive sleep apnea (adult) (pediatric): Secondary | ICD-10-CM | POA: Diagnosis not present

## 2023-07-20 DIAGNOSIS — N3020 Other chronic cystitis without hematuria: Secondary | ICD-10-CM | POA: Diagnosis not present

## 2023-07-29 DIAGNOSIS — A419 Sepsis, unspecified organism: Secondary | ICD-10-CM | POA: Diagnosis not present

## 2023-07-29 DIAGNOSIS — A4152 Sepsis due to Pseudomonas: Secondary | ICD-10-CM | POA: Diagnosis not present

## 2023-07-29 DIAGNOSIS — Z7982 Long term (current) use of aspirin: Secondary | ICD-10-CM | POA: Diagnosis not present

## 2023-07-29 DIAGNOSIS — I129 Hypertensive chronic kidney disease with stage 1 through stage 4 chronic kidney disease, or unspecified chronic kidney disease: Secondary | ICD-10-CM | POA: Diagnosis not present

## 2023-07-29 DIAGNOSIS — N1832 Chronic kidney disease, stage 3b: Secondary | ICD-10-CM | POA: Diagnosis not present

## 2023-07-29 DIAGNOSIS — N390 Urinary tract infection, site not specified: Secondary | ICD-10-CM | POA: Diagnosis not present

## 2023-07-29 DIAGNOSIS — G4733 Obstructive sleep apnea (adult) (pediatric): Secondary | ICD-10-CM | POA: Diagnosis not present

## 2023-07-29 DIAGNOSIS — Z9181 History of falling: Secondary | ICD-10-CM | POA: Diagnosis not present

## 2023-07-29 DIAGNOSIS — S065X0D Traumatic subdural hemorrhage without loss of consciousness, subsequent encounter: Secondary | ICD-10-CM | POA: Diagnosis not present

## 2023-07-29 DIAGNOSIS — I69351 Hemiplegia and hemiparesis following cerebral infarction affecting right dominant side: Secondary | ICD-10-CM | POA: Diagnosis not present

## 2023-07-29 DIAGNOSIS — I700 Atherosclerosis of aorta: Secondary | ICD-10-CM | POA: Diagnosis not present

## 2023-07-29 DIAGNOSIS — Z604 Social exclusion and rejection: Secondary | ICD-10-CM | POA: Diagnosis not present

## 2023-08-02 DIAGNOSIS — Z87440 Personal history of urinary (tract) infections: Secondary | ICD-10-CM | POA: Diagnosis not present

## 2023-08-02 DIAGNOSIS — Z604 Social exclusion and rejection: Secondary | ICD-10-CM | POA: Diagnosis not present

## 2023-08-02 DIAGNOSIS — Z7982 Long term (current) use of aspirin: Secondary | ICD-10-CM | POA: Diagnosis not present

## 2023-08-02 DIAGNOSIS — I700 Atherosclerosis of aorta: Secondary | ICD-10-CM | POA: Diagnosis not present

## 2023-08-02 DIAGNOSIS — N1832 Chronic kidney disease, stage 3b: Secondary | ICD-10-CM | POA: Diagnosis not present

## 2023-08-02 DIAGNOSIS — S065X0D Traumatic subdural hemorrhage without loss of consciousness, subsequent encounter: Secondary | ICD-10-CM | POA: Diagnosis not present

## 2023-08-02 DIAGNOSIS — Z9181 History of falling: Secondary | ICD-10-CM | POA: Diagnosis not present

## 2023-08-02 DIAGNOSIS — I129 Hypertensive chronic kidney disease with stage 1 through stage 4 chronic kidney disease, or unspecified chronic kidney disease: Secondary | ICD-10-CM | POA: Diagnosis not present

## 2023-08-02 DIAGNOSIS — G4733 Obstructive sleep apnea (adult) (pediatric): Secondary | ICD-10-CM | POA: Diagnosis not present

## 2023-08-02 DIAGNOSIS — I69351 Hemiplegia and hemiparesis following cerebral infarction affecting right dominant side: Secondary | ICD-10-CM | POA: Diagnosis not present

## 2023-08-03 DIAGNOSIS — S065X0D Traumatic subdural hemorrhage without loss of consciousness, subsequent encounter: Secondary | ICD-10-CM | POA: Diagnosis not present

## 2023-08-03 DIAGNOSIS — Z9181 History of falling: Secondary | ICD-10-CM | POA: Diagnosis not present

## 2023-08-03 DIAGNOSIS — Z87440 Personal history of urinary (tract) infections: Secondary | ICD-10-CM | POA: Diagnosis not present

## 2023-08-03 DIAGNOSIS — N1832 Chronic kidney disease, stage 3b: Secondary | ICD-10-CM | POA: Diagnosis not present

## 2023-08-03 DIAGNOSIS — Z7982 Long term (current) use of aspirin: Secondary | ICD-10-CM | POA: Diagnosis not present

## 2023-08-03 DIAGNOSIS — I69351 Hemiplegia and hemiparesis following cerebral infarction affecting right dominant side: Secondary | ICD-10-CM | POA: Diagnosis not present

## 2023-08-03 DIAGNOSIS — Z604 Social exclusion and rejection: Secondary | ICD-10-CM | POA: Diagnosis not present

## 2023-08-03 DIAGNOSIS — G4733 Obstructive sleep apnea (adult) (pediatric): Secondary | ICD-10-CM | POA: Diagnosis not present

## 2023-08-03 DIAGNOSIS — I129 Hypertensive chronic kidney disease with stage 1 through stage 4 chronic kidney disease, or unspecified chronic kidney disease: Secondary | ICD-10-CM | POA: Diagnosis not present

## 2023-08-03 DIAGNOSIS — I700 Atherosclerosis of aorta: Secondary | ICD-10-CM | POA: Diagnosis not present

## 2023-08-09 DIAGNOSIS — Z9181 History of falling: Secondary | ICD-10-CM | POA: Diagnosis not present

## 2023-08-09 DIAGNOSIS — S065X0D Traumatic subdural hemorrhage without loss of consciousness, subsequent encounter: Secondary | ICD-10-CM | POA: Diagnosis not present

## 2023-08-09 DIAGNOSIS — Z7982 Long term (current) use of aspirin: Secondary | ICD-10-CM | POA: Diagnosis not present

## 2023-08-09 DIAGNOSIS — I129 Hypertensive chronic kidney disease with stage 1 through stage 4 chronic kidney disease, or unspecified chronic kidney disease: Secondary | ICD-10-CM | POA: Diagnosis not present

## 2023-08-09 DIAGNOSIS — Z87440 Personal history of urinary (tract) infections: Secondary | ICD-10-CM | POA: Diagnosis not present

## 2023-08-09 DIAGNOSIS — G4733 Obstructive sleep apnea (adult) (pediatric): Secondary | ICD-10-CM | POA: Diagnosis not present

## 2023-08-09 DIAGNOSIS — N1832 Chronic kidney disease, stage 3b: Secondary | ICD-10-CM | POA: Diagnosis not present

## 2023-08-09 DIAGNOSIS — I69351 Hemiplegia and hemiparesis following cerebral infarction affecting right dominant side: Secondary | ICD-10-CM | POA: Diagnosis not present

## 2023-08-09 DIAGNOSIS — I700 Atherosclerosis of aorta: Secondary | ICD-10-CM | POA: Diagnosis not present

## 2023-08-09 DIAGNOSIS — Z604 Social exclusion and rejection: Secondary | ICD-10-CM | POA: Diagnosis not present

## 2023-08-11 DIAGNOSIS — I69351 Hemiplegia and hemiparesis following cerebral infarction affecting right dominant side: Secondary | ICD-10-CM | POA: Diagnosis not present

## 2023-08-11 DIAGNOSIS — N1832 Chronic kidney disease, stage 3b: Secondary | ICD-10-CM | POA: Diagnosis not present

## 2023-08-11 DIAGNOSIS — Z9181 History of falling: Secondary | ICD-10-CM | POA: Diagnosis not present

## 2023-08-11 DIAGNOSIS — Z87440 Personal history of urinary (tract) infections: Secondary | ICD-10-CM | POA: Diagnosis not present

## 2023-08-11 DIAGNOSIS — Z7982 Long term (current) use of aspirin: Secondary | ICD-10-CM | POA: Diagnosis not present

## 2023-08-11 DIAGNOSIS — Z604 Social exclusion and rejection: Secondary | ICD-10-CM | POA: Diagnosis not present

## 2023-08-11 DIAGNOSIS — S065X0D Traumatic subdural hemorrhage without loss of consciousness, subsequent encounter: Secondary | ICD-10-CM | POA: Diagnosis not present

## 2023-08-11 DIAGNOSIS — I700 Atherosclerosis of aorta: Secondary | ICD-10-CM | POA: Diagnosis not present

## 2023-08-11 DIAGNOSIS — I129 Hypertensive chronic kidney disease with stage 1 through stage 4 chronic kidney disease, or unspecified chronic kidney disease: Secondary | ICD-10-CM | POA: Diagnosis not present

## 2023-08-11 DIAGNOSIS — G4733 Obstructive sleep apnea (adult) (pediatric): Secondary | ICD-10-CM | POA: Diagnosis not present

## 2023-08-16 DIAGNOSIS — Z9181 History of falling: Secondary | ICD-10-CM | POA: Diagnosis not present

## 2023-08-16 DIAGNOSIS — G4733 Obstructive sleep apnea (adult) (pediatric): Secondary | ICD-10-CM | POA: Diagnosis not present

## 2023-08-16 DIAGNOSIS — N1832 Chronic kidney disease, stage 3b: Secondary | ICD-10-CM | POA: Diagnosis not present

## 2023-08-16 DIAGNOSIS — Z87440 Personal history of urinary (tract) infections: Secondary | ICD-10-CM | POA: Diagnosis not present

## 2023-08-16 DIAGNOSIS — S065X0D Traumatic subdural hemorrhage without loss of consciousness, subsequent encounter: Secondary | ICD-10-CM | POA: Diagnosis not present

## 2023-08-16 DIAGNOSIS — I700 Atherosclerosis of aorta: Secondary | ICD-10-CM | POA: Diagnosis not present

## 2023-08-16 DIAGNOSIS — Z7982 Long term (current) use of aspirin: Secondary | ICD-10-CM | POA: Diagnosis not present

## 2023-08-16 DIAGNOSIS — I129 Hypertensive chronic kidney disease with stage 1 through stage 4 chronic kidney disease, or unspecified chronic kidney disease: Secondary | ICD-10-CM | POA: Diagnosis not present

## 2023-08-16 DIAGNOSIS — I69351 Hemiplegia and hemiparesis following cerebral infarction affecting right dominant side: Secondary | ICD-10-CM | POA: Diagnosis not present

## 2023-08-16 DIAGNOSIS — Z604 Social exclusion and rejection: Secondary | ICD-10-CM | POA: Diagnosis not present

## 2023-08-17 DIAGNOSIS — N1832 Chronic kidney disease, stage 3b: Secondary | ICD-10-CM | POA: Diagnosis not present

## 2023-08-17 DIAGNOSIS — Z9181 History of falling: Secondary | ICD-10-CM | POA: Diagnosis not present

## 2023-08-17 DIAGNOSIS — Z87440 Personal history of urinary (tract) infections: Secondary | ICD-10-CM | POA: Diagnosis not present

## 2023-08-17 DIAGNOSIS — I69351 Hemiplegia and hemiparesis following cerebral infarction affecting right dominant side: Secondary | ICD-10-CM | POA: Diagnosis not present

## 2023-08-17 DIAGNOSIS — Z604 Social exclusion and rejection: Secondary | ICD-10-CM | POA: Diagnosis not present

## 2023-08-17 DIAGNOSIS — G4733 Obstructive sleep apnea (adult) (pediatric): Secondary | ICD-10-CM | POA: Diagnosis not present

## 2023-08-17 DIAGNOSIS — Z7982 Long term (current) use of aspirin: Secondary | ICD-10-CM | POA: Diagnosis not present

## 2023-08-17 DIAGNOSIS — S065X0D Traumatic subdural hemorrhage without loss of consciousness, subsequent encounter: Secondary | ICD-10-CM | POA: Diagnosis not present

## 2023-08-17 DIAGNOSIS — I700 Atherosclerosis of aorta: Secondary | ICD-10-CM | POA: Diagnosis not present

## 2023-08-17 DIAGNOSIS — I129 Hypertensive chronic kidney disease with stage 1 through stage 4 chronic kidney disease, or unspecified chronic kidney disease: Secondary | ICD-10-CM | POA: Diagnosis not present

## 2023-08-22 DIAGNOSIS — I69351 Hemiplegia and hemiparesis following cerebral infarction affecting right dominant side: Secondary | ICD-10-CM | POA: Diagnosis not present

## 2023-08-22 DIAGNOSIS — Z7982 Long term (current) use of aspirin: Secondary | ICD-10-CM | POA: Diagnosis not present

## 2023-08-22 DIAGNOSIS — I129 Hypertensive chronic kidney disease with stage 1 through stage 4 chronic kidney disease, or unspecified chronic kidney disease: Secondary | ICD-10-CM | POA: Diagnosis not present

## 2023-08-22 DIAGNOSIS — G4733 Obstructive sleep apnea (adult) (pediatric): Secondary | ICD-10-CM | POA: Diagnosis not present

## 2023-08-22 DIAGNOSIS — Z9181 History of falling: Secondary | ICD-10-CM | POA: Diagnosis not present

## 2023-08-22 DIAGNOSIS — N1832 Chronic kidney disease, stage 3b: Secondary | ICD-10-CM | POA: Diagnosis not present

## 2023-08-22 DIAGNOSIS — I700 Atherosclerosis of aorta: Secondary | ICD-10-CM | POA: Diagnosis not present

## 2023-08-22 DIAGNOSIS — S065X0D Traumatic subdural hemorrhage without loss of consciousness, subsequent encounter: Secondary | ICD-10-CM | POA: Diagnosis not present

## 2023-08-22 DIAGNOSIS — Z87440 Personal history of urinary (tract) infections: Secondary | ICD-10-CM | POA: Diagnosis not present

## 2023-08-22 DIAGNOSIS — Z604 Social exclusion and rejection: Secondary | ICD-10-CM | POA: Diagnosis not present

## 2023-08-23 DIAGNOSIS — H5213 Myopia, bilateral: Secondary | ICD-10-CM | POA: Diagnosis not present

## 2023-08-23 DIAGNOSIS — H43393 Other vitreous opacities, bilateral: Secondary | ICD-10-CM | POA: Diagnosis not present

## 2023-08-23 DIAGNOSIS — H35371 Puckering of macula, right eye: Secondary | ICD-10-CM | POA: Diagnosis not present

## 2023-08-23 DIAGNOSIS — H52223 Regular astigmatism, bilateral: Secondary | ICD-10-CM | POA: Diagnosis not present

## 2023-08-23 DIAGNOSIS — H53143 Visual discomfort, bilateral: Secondary | ICD-10-CM | POA: Diagnosis not present

## 2023-08-23 DIAGNOSIS — H524 Presbyopia: Secondary | ICD-10-CM | POA: Diagnosis not present

## 2023-08-23 DIAGNOSIS — H2513 Age-related nuclear cataract, bilateral: Secondary | ICD-10-CM | POA: Diagnosis not present

## 2023-08-26 DIAGNOSIS — G4733 Obstructive sleep apnea (adult) (pediatric): Secondary | ICD-10-CM | POA: Diagnosis not present

## 2023-08-26 DIAGNOSIS — N1832 Chronic kidney disease, stage 3b: Secondary | ICD-10-CM | POA: Diagnosis not present

## 2023-08-26 DIAGNOSIS — Z7982 Long term (current) use of aspirin: Secondary | ICD-10-CM | POA: Diagnosis not present

## 2023-08-26 DIAGNOSIS — Z604 Social exclusion and rejection: Secondary | ICD-10-CM | POA: Diagnosis not present

## 2023-08-26 DIAGNOSIS — Z87440 Personal history of urinary (tract) infections: Secondary | ICD-10-CM | POA: Diagnosis not present

## 2023-08-26 DIAGNOSIS — I700 Atherosclerosis of aorta: Secondary | ICD-10-CM | POA: Diagnosis not present

## 2023-08-26 DIAGNOSIS — I129 Hypertensive chronic kidney disease with stage 1 through stage 4 chronic kidney disease, or unspecified chronic kidney disease: Secondary | ICD-10-CM | POA: Diagnosis not present

## 2023-08-26 DIAGNOSIS — Z9181 History of falling: Secondary | ICD-10-CM | POA: Diagnosis not present

## 2023-08-26 DIAGNOSIS — S065X0D Traumatic subdural hemorrhage without loss of consciousness, subsequent encounter: Secondary | ICD-10-CM | POA: Diagnosis not present

## 2023-08-26 DIAGNOSIS — I69351 Hemiplegia and hemiparesis following cerebral infarction affecting right dominant side: Secondary | ICD-10-CM | POA: Diagnosis not present

## 2023-09-05 DIAGNOSIS — N1832 Chronic kidney disease, stage 3b: Secondary | ICD-10-CM | POA: Diagnosis not present

## 2023-09-05 DIAGNOSIS — Z87440 Personal history of urinary (tract) infections: Secondary | ICD-10-CM | POA: Diagnosis not present

## 2023-09-05 DIAGNOSIS — Z9181 History of falling: Secondary | ICD-10-CM | POA: Diagnosis not present

## 2023-09-05 DIAGNOSIS — Z604 Social exclusion and rejection: Secondary | ICD-10-CM | POA: Diagnosis not present

## 2023-09-05 DIAGNOSIS — I129 Hypertensive chronic kidney disease with stage 1 through stage 4 chronic kidney disease, or unspecified chronic kidney disease: Secondary | ICD-10-CM | POA: Diagnosis not present

## 2023-09-05 DIAGNOSIS — I69351 Hemiplegia and hemiparesis following cerebral infarction affecting right dominant side: Secondary | ICD-10-CM | POA: Diagnosis not present

## 2023-09-05 DIAGNOSIS — G4733 Obstructive sleep apnea (adult) (pediatric): Secondary | ICD-10-CM | POA: Diagnosis not present

## 2023-09-05 DIAGNOSIS — I700 Atherosclerosis of aorta: Secondary | ICD-10-CM | POA: Diagnosis not present

## 2023-09-05 DIAGNOSIS — S065X0D Traumatic subdural hemorrhage without loss of consciousness, subsequent encounter: Secondary | ICD-10-CM | POA: Diagnosis not present

## 2023-09-05 DIAGNOSIS — Z7982 Long term (current) use of aspirin: Secondary | ICD-10-CM | POA: Diagnosis not present

## 2023-09-21 DIAGNOSIS — I69351 Hemiplegia and hemiparesis following cerebral infarction affecting right dominant side: Secondary | ICD-10-CM | POA: Diagnosis not present

## 2023-09-21 DIAGNOSIS — S065X0D Traumatic subdural hemorrhage without loss of consciousness, subsequent encounter: Secondary | ICD-10-CM | POA: Diagnosis not present

## 2023-09-21 DIAGNOSIS — I129 Hypertensive chronic kidney disease with stage 1 through stage 4 chronic kidney disease, or unspecified chronic kidney disease: Secondary | ICD-10-CM | POA: Diagnosis not present

## 2023-09-21 DIAGNOSIS — Z87440 Personal history of urinary (tract) infections: Secondary | ICD-10-CM | POA: Diagnosis not present

## 2023-09-21 DIAGNOSIS — Z7982 Long term (current) use of aspirin: Secondary | ICD-10-CM | POA: Diagnosis not present

## 2023-09-21 DIAGNOSIS — Z604 Social exclusion and rejection: Secondary | ICD-10-CM | POA: Diagnosis not present

## 2023-09-21 DIAGNOSIS — Z9181 History of falling: Secondary | ICD-10-CM | POA: Diagnosis not present

## 2023-09-21 DIAGNOSIS — N1832 Chronic kidney disease, stage 3b: Secondary | ICD-10-CM | POA: Diagnosis not present

## 2023-09-21 DIAGNOSIS — I700 Atherosclerosis of aorta: Secondary | ICD-10-CM | POA: Diagnosis not present

## 2023-09-21 DIAGNOSIS — G4733 Obstructive sleep apnea (adult) (pediatric): Secondary | ICD-10-CM | POA: Diagnosis not present

## 2023-09-22 DIAGNOSIS — Z9181 History of falling: Secondary | ICD-10-CM | POA: Diagnosis not present

## 2023-09-22 DIAGNOSIS — Z7982 Long term (current) use of aspirin: Secondary | ICD-10-CM | POA: Diagnosis not present

## 2023-09-22 DIAGNOSIS — S065X0D Traumatic subdural hemorrhage without loss of consciousness, subsequent encounter: Secondary | ICD-10-CM | POA: Diagnosis not present

## 2023-09-22 DIAGNOSIS — I129 Hypertensive chronic kidney disease with stage 1 through stage 4 chronic kidney disease, or unspecified chronic kidney disease: Secondary | ICD-10-CM | POA: Diagnosis not present

## 2023-09-22 DIAGNOSIS — N1832 Chronic kidney disease, stage 3b: Secondary | ICD-10-CM | POA: Diagnosis not present

## 2023-09-22 DIAGNOSIS — Z604 Social exclusion and rejection: Secondary | ICD-10-CM | POA: Diagnosis not present

## 2023-09-22 DIAGNOSIS — Z87440 Personal history of urinary (tract) infections: Secondary | ICD-10-CM | POA: Diagnosis not present

## 2023-09-22 DIAGNOSIS — G4733 Obstructive sleep apnea (adult) (pediatric): Secondary | ICD-10-CM | POA: Diagnosis not present

## 2023-09-22 DIAGNOSIS — I69351 Hemiplegia and hemiparesis following cerebral infarction affecting right dominant side: Secondary | ICD-10-CM | POA: Diagnosis not present

## 2023-09-22 DIAGNOSIS — I700 Atherosclerosis of aorta: Secondary | ICD-10-CM | POA: Diagnosis not present

## 2023-09-27 DIAGNOSIS — S065X0D Traumatic subdural hemorrhage without loss of consciousness, subsequent encounter: Secondary | ICD-10-CM | POA: Diagnosis not present

## 2023-09-27 DIAGNOSIS — Z87440 Personal history of urinary (tract) infections: Secondary | ICD-10-CM | POA: Diagnosis not present

## 2023-09-27 DIAGNOSIS — I700 Atherosclerosis of aorta: Secondary | ICD-10-CM | POA: Diagnosis not present

## 2023-09-27 DIAGNOSIS — Z7982 Long term (current) use of aspirin: Secondary | ICD-10-CM | POA: Diagnosis not present

## 2023-09-27 DIAGNOSIS — Z9181 History of falling: Secondary | ICD-10-CM | POA: Diagnosis not present

## 2023-09-27 DIAGNOSIS — I69351 Hemiplegia and hemiparesis following cerebral infarction affecting right dominant side: Secondary | ICD-10-CM | POA: Diagnosis not present

## 2023-09-27 DIAGNOSIS — Z604 Social exclusion and rejection: Secondary | ICD-10-CM | POA: Diagnosis not present

## 2023-09-27 DIAGNOSIS — G4733 Obstructive sleep apnea (adult) (pediatric): Secondary | ICD-10-CM | POA: Diagnosis not present

## 2023-09-27 DIAGNOSIS — N1832 Chronic kidney disease, stage 3b: Secondary | ICD-10-CM | POA: Diagnosis not present

## 2023-09-27 DIAGNOSIS — I129 Hypertensive chronic kidney disease with stage 1 through stage 4 chronic kidney disease, or unspecified chronic kidney disease: Secondary | ICD-10-CM | POA: Diagnosis not present

## 2023-09-30 DIAGNOSIS — S065X0D Traumatic subdural hemorrhage without loss of consciousness, subsequent encounter: Secondary | ICD-10-CM | POA: Diagnosis not present

## 2023-09-30 DIAGNOSIS — I129 Hypertensive chronic kidney disease with stage 1 through stage 4 chronic kidney disease, or unspecified chronic kidney disease: Secondary | ICD-10-CM | POA: Diagnosis not present

## 2023-09-30 DIAGNOSIS — I700 Atherosclerosis of aorta: Secondary | ICD-10-CM | POA: Diagnosis not present

## 2023-09-30 DIAGNOSIS — G4733 Obstructive sleep apnea (adult) (pediatric): Secondary | ICD-10-CM | POA: Diagnosis not present

## 2023-09-30 DIAGNOSIS — Z87440 Personal history of urinary (tract) infections: Secondary | ICD-10-CM | POA: Diagnosis not present

## 2023-09-30 DIAGNOSIS — Z9181 History of falling: Secondary | ICD-10-CM | POA: Diagnosis not present

## 2023-09-30 DIAGNOSIS — I69351 Hemiplegia and hemiparesis following cerebral infarction affecting right dominant side: Secondary | ICD-10-CM | POA: Diagnosis not present

## 2023-09-30 DIAGNOSIS — Z604 Social exclusion and rejection: Secondary | ICD-10-CM | POA: Diagnosis not present

## 2023-09-30 DIAGNOSIS — Z7982 Long term (current) use of aspirin: Secondary | ICD-10-CM | POA: Diagnosis not present

## 2023-09-30 DIAGNOSIS — N1832 Chronic kidney disease, stage 3b: Secondary | ICD-10-CM | POA: Diagnosis not present

## 2023-10-04 DIAGNOSIS — Z604 Social exclusion and rejection: Secondary | ICD-10-CM | POA: Diagnosis not present

## 2023-10-04 DIAGNOSIS — I69391 Dysphagia following cerebral infarction: Secondary | ICD-10-CM | POA: Diagnosis not present

## 2023-10-04 DIAGNOSIS — Z7982 Long term (current) use of aspirin: Secondary | ICD-10-CM | POA: Diagnosis not present

## 2023-10-04 DIAGNOSIS — Z87820 Personal history of traumatic brain injury: Secondary | ICD-10-CM | POA: Diagnosis not present

## 2023-10-04 DIAGNOSIS — E785 Hyperlipidemia, unspecified: Secondary | ICD-10-CM | POA: Diagnosis not present

## 2023-10-04 DIAGNOSIS — Z87440 Personal history of urinary (tract) infections: Secondary | ICD-10-CM | POA: Diagnosis not present

## 2023-10-04 DIAGNOSIS — I69351 Hemiplegia and hemiparesis following cerebral infarction affecting right dominant side: Secondary | ICD-10-CM | POA: Diagnosis not present

## 2023-10-04 DIAGNOSIS — I129 Hypertensive chronic kidney disease with stage 1 through stage 4 chronic kidney disease, or unspecified chronic kidney disease: Secondary | ICD-10-CM | POA: Diagnosis not present

## 2023-10-04 DIAGNOSIS — Z9181 History of falling: Secondary | ICD-10-CM | POA: Diagnosis not present

## 2023-10-04 DIAGNOSIS — G4733 Obstructive sleep apnea (adult) (pediatric): Secondary | ICD-10-CM | POA: Diagnosis not present

## 2023-10-04 DIAGNOSIS — I69322 Dysarthria following cerebral infarction: Secondary | ICD-10-CM | POA: Diagnosis not present

## 2023-10-04 DIAGNOSIS — N1832 Chronic kidney disease, stage 3b: Secondary | ICD-10-CM | POA: Diagnosis not present

## 2023-10-04 DIAGNOSIS — I700 Atherosclerosis of aorta: Secondary | ICD-10-CM | POA: Diagnosis not present

## 2023-10-10 DIAGNOSIS — Z9181 History of falling: Secondary | ICD-10-CM | POA: Diagnosis not present

## 2023-10-10 DIAGNOSIS — N1832 Chronic kidney disease, stage 3b: Secondary | ICD-10-CM | POA: Diagnosis not present

## 2023-10-10 DIAGNOSIS — E785 Hyperlipidemia, unspecified: Secondary | ICD-10-CM | POA: Diagnosis not present

## 2023-10-10 DIAGNOSIS — I69391 Dysphagia following cerebral infarction: Secondary | ICD-10-CM | POA: Diagnosis not present

## 2023-10-10 DIAGNOSIS — I69322 Dysarthria following cerebral infarction: Secondary | ICD-10-CM | POA: Diagnosis not present

## 2023-10-10 DIAGNOSIS — Z604 Social exclusion and rejection: Secondary | ICD-10-CM | POA: Diagnosis not present

## 2023-10-10 DIAGNOSIS — I700 Atherosclerosis of aorta: Secondary | ICD-10-CM | POA: Diagnosis not present

## 2023-10-10 DIAGNOSIS — Z7982 Long term (current) use of aspirin: Secondary | ICD-10-CM | POA: Diagnosis not present

## 2023-10-10 DIAGNOSIS — I69351 Hemiplegia and hemiparesis following cerebral infarction affecting right dominant side: Secondary | ICD-10-CM | POA: Diagnosis not present

## 2023-10-10 DIAGNOSIS — R1312 Dysphagia, oropharyngeal phase: Secondary | ICD-10-CM | POA: Diagnosis not present

## 2023-10-10 DIAGNOSIS — G4733 Obstructive sleep apnea (adult) (pediatric): Secondary | ICD-10-CM | POA: Diagnosis not present

## 2023-10-10 DIAGNOSIS — Z87440 Personal history of urinary (tract) infections: Secondary | ICD-10-CM | POA: Diagnosis not present

## 2023-10-10 DIAGNOSIS — Z87820 Personal history of traumatic brain injury: Secondary | ICD-10-CM | POA: Diagnosis not present

## 2023-10-10 DIAGNOSIS — I129 Hypertensive chronic kidney disease with stage 1 through stage 4 chronic kidney disease, or unspecified chronic kidney disease: Secondary | ICD-10-CM | POA: Diagnosis not present

## 2023-10-12 DIAGNOSIS — I129 Hypertensive chronic kidney disease with stage 1 through stage 4 chronic kidney disease, or unspecified chronic kidney disease: Secondary | ICD-10-CM | POA: Diagnosis not present

## 2023-10-12 DIAGNOSIS — Z7982 Long term (current) use of aspirin: Secondary | ICD-10-CM | POA: Diagnosis not present

## 2023-10-12 DIAGNOSIS — E785 Hyperlipidemia, unspecified: Secondary | ICD-10-CM | POA: Diagnosis not present

## 2023-10-12 DIAGNOSIS — Z604 Social exclusion and rejection: Secondary | ICD-10-CM | POA: Diagnosis not present

## 2023-10-12 DIAGNOSIS — Z9181 History of falling: Secondary | ICD-10-CM | POA: Diagnosis not present

## 2023-10-12 DIAGNOSIS — I69322 Dysarthria following cerebral infarction: Secondary | ICD-10-CM | POA: Diagnosis not present

## 2023-10-12 DIAGNOSIS — Z87440 Personal history of urinary (tract) infections: Secondary | ICD-10-CM | POA: Diagnosis not present

## 2023-10-12 DIAGNOSIS — I700 Atherosclerosis of aorta: Secondary | ICD-10-CM | POA: Diagnosis not present

## 2023-10-12 DIAGNOSIS — I69351 Hemiplegia and hemiparesis following cerebral infarction affecting right dominant side: Secondary | ICD-10-CM | POA: Diagnosis not present

## 2023-10-12 DIAGNOSIS — G4733 Obstructive sleep apnea (adult) (pediatric): Secondary | ICD-10-CM | POA: Diagnosis not present

## 2023-10-12 DIAGNOSIS — R1312 Dysphagia, oropharyngeal phase: Secondary | ICD-10-CM | POA: Diagnosis not present

## 2023-10-12 DIAGNOSIS — I69391 Dysphagia following cerebral infarction: Secondary | ICD-10-CM | POA: Diagnosis not present

## 2023-10-12 DIAGNOSIS — N1832 Chronic kidney disease, stage 3b: Secondary | ICD-10-CM | POA: Diagnosis not present

## 2023-10-12 DIAGNOSIS — Z87820 Personal history of traumatic brain injury: Secondary | ICD-10-CM | POA: Diagnosis not present

## 2023-10-17 DIAGNOSIS — R1312 Dysphagia, oropharyngeal phase: Secondary | ICD-10-CM | POA: Diagnosis not present

## 2023-10-17 DIAGNOSIS — N1832 Chronic kidney disease, stage 3b: Secondary | ICD-10-CM | POA: Diagnosis not present

## 2023-10-17 DIAGNOSIS — E785 Hyperlipidemia, unspecified: Secondary | ICD-10-CM | POA: Diagnosis not present

## 2023-10-17 DIAGNOSIS — Z604 Social exclusion and rejection: Secondary | ICD-10-CM | POA: Diagnosis not present

## 2023-10-17 DIAGNOSIS — Z9181 History of falling: Secondary | ICD-10-CM | POA: Diagnosis not present

## 2023-10-17 DIAGNOSIS — G4733 Obstructive sleep apnea (adult) (pediatric): Secondary | ICD-10-CM | POA: Diagnosis not present

## 2023-10-17 DIAGNOSIS — Z87820 Personal history of traumatic brain injury: Secondary | ICD-10-CM | POA: Diagnosis not present

## 2023-10-17 DIAGNOSIS — Z87440 Personal history of urinary (tract) infections: Secondary | ICD-10-CM | POA: Diagnosis not present

## 2023-10-17 DIAGNOSIS — I129 Hypertensive chronic kidney disease with stage 1 through stage 4 chronic kidney disease, or unspecified chronic kidney disease: Secondary | ICD-10-CM | POA: Diagnosis not present

## 2023-10-17 DIAGNOSIS — I69322 Dysarthria following cerebral infarction: Secondary | ICD-10-CM | POA: Diagnosis not present

## 2023-10-17 DIAGNOSIS — I69351 Hemiplegia and hemiparesis following cerebral infarction affecting right dominant side: Secondary | ICD-10-CM | POA: Diagnosis not present

## 2023-10-17 DIAGNOSIS — I700 Atherosclerosis of aorta: Secondary | ICD-10-CM | POA: Diagnosis not present

## 2023-10-17 DIAGNOSIS — Z7982 Long term (current) use of aspirin: Secondary | ICD-10-CM | POA: Diagnosis not present

## 2023-10-17 DIAGNOSIS — I69391 Dysphagia following cerebral infarction: Secondary | ICD-10-CM | POA: Diagnosis not present

## 2023-10-21 DIAGNOSIS — R1312 Dysphagia, oropharyngeal phase: Secondary | ICD-10-CM | POA: Diagnosis not present

## 2023-10-21 DIAGNOSIS — Z7982 Long term (current) use of aspirin: Secondary | ICD-10-CM | POA: Diagnosis not present

## 2023-10-21 DIAGNOSIS — I700 Atherosclerosis of aorta: Secondary | ICD-10-CM | POA: Diagnosis not present

## 2023-10-21 DIAGNOSIS — Z87820 Personal history of traumatic brain injury: Secondary | ICD-10-CM | POA: Diagnosis not present

## 2023-10-21 DIAGNOSIS — I69322 Dysarthria following cerebral infarction: Secondary | ICD-10-CM | POA: Diagnosis not present

## 2023-10-21 DIAGNOSIS — I129 Hypertensive chronic kidney disease with stage 1 through stage 4 chronic kidney disease, or unspecified chronic kidney disease: Secondary | ICD-10-CM | POA: Diagnosis not present

## 2023-10-21 DIAGNOSIS — N1832 Chronic kidney disease, stage 3b: Secondary | ICD-10-CM | POA: Diagnosis not present

## 2023-10-21 DIAGNOSIS — I69391 Dysphagia following cerebral infarction: Secondary | ICD-10-CM | POA: Diagnosis not present

## 2023-10-21 DIAGNOSIS — Z9181 History of falling: Secondary | ICD-10-CM | POA: Diagnosis not present

## 2023-10-21 DIAGNOSIS — Z604 Social exclusion and rejection: Secondary | ICD-10-CM | POA: Diagnosis not present

## 2023-10-21 DIAGNOSIS — I69351 Hemiplegia and hemiparesis following cerebral infarction affecting right dominant side: Secondary | ICD-10-CM | POA: Diagnosis not present

## 2023-10-21 DIAGNOSIS — E785 Hyperlipidemia, unspecified: Secondary | ICD-10-CM | POA: Diagnosis not present

## 2023-10-21 DIAGNOSIS — G4733 Obstructive sleep apnea (adult) (pediatric): Secondary | ICD-10-CM | POA: Diagnosis not present

## 2023-10-21 DIAGNOSIS — Z87440 Personal history of urinary (tract) infections: Secondary | ICD-10-CM | POA: Diagnosis not present

## 2023-10-24 DIAGNOSIS — I69351 Hemiplegia and hemiparesis following cerebral infarction affecting right dominant side: Secondary | ICD-10-CM | POA: Diagnosis not present

## 2023-10-24 DIAGNOSIS — G4733 Obstructive sleep apnea (adult) (pediatric): Secondary | ICD-10-CM | POA: Diagnosis not present

## 2023-10-24 DIAGNOSIS — Z9181 History of falling: Secondary | ICD-10-CM | POA: Diagnosis not present

## 2023-10-24 DIAGNOSIS — Z7982 Long term (current) use of aspirin: Secondary | ICD-10-CM | POA: Diagnosis not present

## 2023-10-24 DIAGNOSIS — Z87820 Personal history of traumatic brain injury: Secondary | ICD-10-CM | POA: Diagnosis not present

## 2023-10-24 DIAGNOSIS — R1312 Dysphagia, oropharyngeal phase: Secondary | ICD-10-CM | POA: Diagnosis not present

## 2023-10-24 DIAGNOSIS — I69391 Dysphagia following cerebral infarction: Secondary | ICD-10-CM | POA: Diagnosis not present

## 2023-10-24 DIAGNOSIS — N1832 Chronic kidney disease, stage 3b: Secondary | ICD-10-CM | POA: Diagnosis not present

## 2023-10-24 DIAGNOSIS — I700 Atherosclerosis of aorta: Secondary | ICD-10-CM | POA: Diagnosis not present

## 2023-10-24 DIAGNOSIS — Z604 Social exclusion and rejection: Secondary | ICD-10-CM | POA: Diagnosis not present

## 2023-10-24 DIAGNOSIS — I69322 Dysarthria following cerebral infarction: Secondary | ICD-10-CM | POA: Diagnosis not present

## 2023-10-24 DIAGNOSIS — I129 Hypertensive chronic kidney disease with stage 1 through stage 4 chronic kidney disease, or unspecified chronic kidney disease: Secondary | ICD-10-CM | POA: Diagnosis not present

## 2023-10-24 DIAGNOSIS — Z87440 Personal history of urinary (tract) infections: Secondary | ICD-10-CM | POA: Diagnosis not present

## 2023-10-24 DIAGNOSIS — E785 Hyperlipidemia, unspecified: Secondary | ICD-10-CM | POA: Diagnosis not present

## 2023-10-26 DIAGNOSIS — R1312 Dysphagia, oropharyngeal phase: Secondary | ICD-10-CM | POA: Diagnosis not present

## 2023-10-26 DIAGNOSIS — Z604 Social exclusion and rejection: Secondary | ICD-10-CM | POA: Diagnosis not present

## 2023-10-26 DIAGNOSIS — Z9181 History of falling: Secondary | ICD-10-CM | POA: Diagnosis not present

## 2023-10-26 DIAGNOSIS — Z87820 Personal history of traumatic brain injury: Secondary | ICD-10-CM | POA: Diagnosis not present

## 2023-10-26 DIAGNOSIS — I69322 Dysarthria following cerebral infarction: Secondary | ICD-10-CM | POA: Diagnosis not present

## 2023-10-26 DIAGNOSIS — I69351 Hemiplegia and hemiparesis following cerebral infarction affecting right dominant side: Secondary | ICD-10-CM | POA: Diagnosis not present

## 2023-10-26 DIAGNOSIS — Z7982 Long term (current) use of aspirin: Secondary | ICD-10-CM | POA: Diagnosis not present

## 2023-10-26 DIAGNOSIS — I69391 Dysphagia following cerebral infarction: Secondary | ICD-10-CM | POA: Diagnosis not present

## 2023-10-26 DIAGNOSIS — N1832 Chronic kidney disease, stage 3b: Secondary | ICD-10-CM | POA: Diagnosis not present

## 2023-10-26 DIAGNOSIS — E785 Hyperlipidemia, unspecified: Secondary | ICD-10-CM | POA: Diagnosis not present

## 2023-10-26 DIAGNOSIS — Z87440 Personal history of urinary (tract) infections: Secondary | ICD-10-CM | POA: Diagnosis not present

## 2023-10-26 DIAGNOSIS — I700 Atherosclerosis of aorta: Secondary | ICD-10-CM | POA: Diagnosis not present

## 2023-10-26 DIAGNOSIS — G4733 Obstructive sleep apnea (adult) (pediatric): Secondary | ICD-10-CM | POA: Diagnosis not present

## 2023-10-26 DIAGNOSIS — I129 Hypertensive chronic kidney disease with stage 1 through stage 4 chronic kidney disease, or unspecified chronic kidney disease: Secondary | ICD-10-CM | POA: Diagnosis not present

## 2023-11-02 DIAGNOSIS — Z87820 Personal history of traumatic brain injury: Secondary | ICD-10-CM | POA: Diagnosis not present

## 2023-11-02 DIAGNOSIS — R1312 Dysphagia, oropharyngeal phase: Secondary | ICD-10-CM | POA: Diagnosis not present

## 2023-11-02 DIAGNOSIS — I69391 Dysphagia following cerebral infarction: Secondary | ICD-10-CM | POA: Diagnosis not present

## 2023-11-02 DIAGNOSIS — I69322 Dysarthria following cerebral infarction: Secondary | ICD-10-CM | POA: Diagnosis not present

## 2023-11-02 DIAGNOSIS — I69351 Hemiplegia and hemiparesis following cerebral infarction affecting right dominant side: Secondary | ICD-10-CM | POA: Diagnosis not present

## 2023-11-02 DIAGNOSIS — I129 Hypertensive chronic kidney disease with stage 1 through stage 4 chronic kidney disease, or unspecified chronic kidney disease: Secondary | ICD-10-CM | POA: Diagnosis not present

## 2023-11-02 DIAGNOSIS — Z9181 History of falling: Secondary | ICD-10-CM | POA: Diagnosis not present

## 2023-11-02 DIAGNOSIS — G4733 Obstructive sleep apnea (adult) (pediatric): Secondary | ICD-10-CM | POA: Diagnosis not present

## 2023-11-02 DIAGNOSIS — Z604 Social exclusion and rejection: Secondary | ICD-10-CM | POA: Diagnosis not present

## 2023-11-02 DIAGNOSIS — Z87440 Personal history of urinary (tract) infections: Secondary | ICD-10-CM | POA: Diagnosis not present

## 2023-11-02 DIAGNOSIS — N1832 Chronic kidney disease, stage 3b: Secondary | ICD-10-CM | POA: Diagnosis not present

## 2023-11-02 DIAGNOSIS — Z7982 Long term (current) use of aspirin: Secondary | ICD-10-CM | POA: Diagnosis not present

## 2023-11-02 DIAGNOSIS — E785 Hyperlipidemia, unspecified: Secondary | ICD-10-CM | POA: Diagnosis not present

## 2023-11-02 DIAGNOSIS — I700 Atherosclerosis of aorta: Secondary | ICD-10-CM | POA: Diagnosis not present

## 2023-11-03 DIAGNOSIS — Z87820 Personal history of traumatic brain injury: Secondary | ICD-10-CM | POA: Diagnosis not present

## 2023-11-03 DIAGNOSIS — I69391 Dysphagia following cerebral infarction: Secondary | ICD-10-CM | POA: Diagnosis not present

## 2023-11-03 DIAGNOSIS — Z7982 Long term (current) use of aspirin: Secondary | ICD-10-CM | POA: Diagnosis not present

## 2023-11-03 DIAGNOSIS — N1832 Chronic kidney disease, stage 3b: Secondary | ICD-10-CM | POA: Diagnosis not present

## 2023-11-03 DIAGNOSIS — G4733 Obstructive sleep apnea (adult) (pediatric): Secondary | ICD-10-CM | POA: Diagnosis not present

## 2023-11-03 DIAGNOSIS — R1312 Dysphagia, oropharyngeal phase: Secondary | ICD-10-CM | POA: Diagnosis not present

## 2023-11-03 DIAGNOSIS — I69322 Dysarthria following cerebral infarction: Secondary | ICD-10-CM | POA: Diagnosis not present

## 2023-11-03 DIAGNOSIS — Z604 Social exclusion and rejection: Secondary | ICD-10-CM | POA: Diagnosis not present

## 2023-11-03 DIAGNOSIS — I69351 Hemiplegia and hemiparesis following cerebral infarction affecting right dominant side: Secondary | ICD-10-CM | POA: Diagnosis not present

## 2023-11-03 DIAGNOSIS — I129 Hypertensive chronic kidney disease with stage 1 through stage 4 chronic kidney disease, or unspecified chronic kidney disease: Secondary | ICD-10-CM | POA: Diagnosis not present

## 2023-11-03 DIAGNOSIS — Z87440 Personal history of urinary (tract) infections: Secondary | ICD-10-CM | POA: Diagnosis not present

## 2023-11-03 DIAGNOSIS — E785 Hyperlipidemia, unspecified: Secondary | ICD-10-CM | POA: Diagnosis not present

## 2023-11-03 DIAGNOSIS — Z9181 History of falling: Secondary | ICD-10-CM | POA: Diagnosis not present

## 2023-11-03 DIAGNOSIS — I700 Atherosclerosis of aorta: Secondary | ICD-10-CM | POA: Diagnosis not present

## 2023-11-08 DIAGNOSIS — N1832 Chronic kidney disease, stage 3b: Secondary | ICD-10-CM | POA: Diagnosis not present

## 2023-11-08 DIAGNOSIS — Z87820 Personal history of traumatic brain injury: Secondary | ICD-10-CM | POA: Diagnosis not present

## 2023-11-08 DIAGNOSIS — I700 Atherosclerosis of aorta: Secondary | ICD-10-CM | POA: Diagnosis not present

## 2023-11-08 DIAGNOSIS — Z87440 Personal history of urinary (tract) infections: Secondary | ICD-10-CM | POA: Diagnosis not present

## 2023-11-08 DIAGNOSIS — E785 Hyperlipidemia, unspecified: Secondary | ICD-10-CM | POA: Diagnosis not present

## 2023-11-08 DIAGNOSIS — I69391 Dysphagia following cerebral infarction: Secondary | ICD-10-CM | POA: Diagnosis not present

## 2023-11-08 DIAGNOSIS — I69351 Hemiplegia and hemiparesis following cerebral infarction affecting right dominant side: Secondary | ICD-10-CM | POA: Diagnosis not present

## 2023-11-08 DIAGNOSIS — I69322 Dysarthria following cerebral infarction: Secondary | ICD-10-CM | POA: Diagnosis not present

## 2023-11-08 DIAGNOSIS — R1312 Dysphagia, oropharyngeal phase: Secondary | ICD-10-CM | POA: Diagnosis not present

## 2023-11-08 DIAGNOSIS — Z7982 Long term (current) use of aspirin: Secondary | ICD-10-CM | POA: Diagnosis not present

## 2023-11-08 DIAGNOSIS — I129 Hypertensive chronic kidney disease with stage 1 through stage 4 chronic kidney disease, or unspecified chronic kidney disease: Secondary | ICD-10-CM | POA: Diagnosis not present

## 2023-11-08 DIAGNOSIS — Z604 Social exclusion and rejection: Secondary | ICD-10-CM | POA: Diagnosis not present

## 2023-11-08 DIAGNOSIS — Z9181 History of falling: Secondary | ICD-10-CM | POA: Diagnosis not present

## 2023-11-08 DIAGNOSIS — G4733 Obstructive sleep apnea (adult) (pediatric): Secondary | ICD-10-CM | POA: Diagnosis not present

## 2023-11-09 DIAGNOSIS — G4733 Obstructive sleep apnea (adult) (pediatric): Secondary | ICD-10-CM | POA: Diagnosis not present

## 2023-11-09 DIAGNOSIS — I129 Hypertensive chronic kidney disease with stage 1 through stage 4 chronic kidney disease, or unspecified chronic kidney disease: Secondary | ICD-10-CM | POA: Diagnosis not present

## 2023-11-09 DIAGNOSIS — I69351 Hemiplegia and hemiparesis following cerebral infarction affecting right dominant side: Secondary | ICD-10-CM | POA: Diagnosis not present

## 2023-11-09 DIAGNOSIS — E785 Hyperlipidemia, unspecified: Secondary | ICD-10-CM | POA: Diagnosis not present

## 2023-11-09 DIAGNOSIS — Z7982 Long term (current) use of aspirin: Secondary | ICD-10-CM | POA: Diagnosis not present

## 2023-11-09 DIAGNOSIS — Z9181 History of falling: Secondary | ICD-10-CM | POA: Diagnosis not present

## 2023-11-09 DIAGNOSIS — N1832 Chronic kidney disease, stage 3b: Secondary | ICD-10-CM | POA: Diagnosis not present

## 2023-11-09 DIAGNOSIS — Z87820 Personal history of traumatic brain injury: Secondary | ICD-10-CM | POA: Diagnosis not present

## 2023-11-09 DIAGNOSIS — I69391 Dysphagia following cerebral infarction: Secondary | ICD-10-CM | POA: Diagnosis not present

## 2023-11-09 DIAGNOSIS — Z604 Social exclusion and rejection: Secondary | ICD-10-CM | POA: Diagnosis not present

## 2023-11-09 DIAGNOSIS — I700 Atherosclerosis of aorta: Secondary | ICD-10-CM | POA: Diagnosis not present

## 2023-11-09 DIAGNOSIS — R1312 Dysphagia, oropharyngeal phase: Secondary | ICD-10-CM | POA: Diagnosis not present

## 2023-11-09 DIAGNOSIS — I69322 Dysarthria following cerebral infarction: Secondary | ICD-10-CM | POA: Diagnosis not present

## 2023-11-09 DIAGNOSIS — Z87440 Personal history of urinary (tract) infections: Secondary | ICD-10-CM | POA: Diagnosis not present

## 2023-11-14 DIAGNOSIS — E785 Hyperlipidemia, unspecified: Secondary | ICD-10-CM | POA: Diagnosis not present

## 2023-11-14 DIAGNOSIS — I129 Hypertensive chronic kidney disease with stage 1 through stage 4 chronic kidney disease, or unspecified chronic kidney disease: Secondary | ICD-10-CM | POA: Diagnosis not present

## 2023-11-14 DIAGNOSIS — N1832 Chronic kidney disease, stage 3b: Secondary | ICD-10-CM | POA: Diagnosis not present

## 2023-11-14 DIAGNOSIS — Z87820 Personal history of traumatic brain injury: Secondary | ICD-10-CM | POA: Diagnosis not present

## 2023-11-14 DIAGNOSIS — Z9181 History of falling: Secondary | ICD-10-CM | POA: Diagnosis not present

## 2023-11-14 DIAGNOSIS — Z7982 Long term (current) use of aspirin: Secondary | ICD-10-CM | POA: Diagnosis not present

## 2023-11-14 DIAGNOSIS — R1312 Dysphagia, oropharyngeal phase: Secondary | ICD-10-CM | POA: Diagnosis not present

## 2023-11-14 DIAGNOSIS — Z604 Social exclusion and rejection: Secondary | ICD-10-CM | POA: Diagnosis not present

## 2023-11-14 DIAGNOSIS — G4733 Obstructive sleep apnea (adult) (pediatric): Secondary | ICD-10-CM | POA: Diagnosis not present

## 2023-11-14 DIAGNOSIS — I69322 Dysarthria following cerebral infarction: Secondary | ICD-10-CM | POA: Diagnosis not present

## 2023-11-14 DIAGNOSIS — I69351 Hemiplegia and hemiparesis following cerebral infarction affecting right dominant side: Secondary | ICD-10-CM | POA: Diagnosis not present

## 2023-11-14 DIAGNOSIS — Z87440 Personal history of urinary (tract) infections: Secondary | ICD-10-CM | POA: Diagnosis not present

## 2023-11-14 DIAGNOSIS — I700 Atherosclerosis of aorta: Secondary | ICD-10-CM | POA: Diagnosis not present

## 2023-11-14 DIAGNOSIS — I69391 Dysphagia following cerebral infarction: Secondary | ICD-10-CM | POA: Diagnosis not present

## 2023-11-16 DIAGNOSIS — Z7982 Long term (current) use of aspirin: Secondary | ICD-10-CM | POA: Diagnosis not present

## 2023-11-16 DIAGNOSIS — I69351 Hemiplegia and hemiparesis following cerebral infarction affecting right dominant side: Secondary | ICD-10-CM | POA: Diagnosis not present

## 2023-11-16 DIAGNOSIS — G4733 Obstructive sleep apnea (adult) (pediatric): Secondary | ICD-10-CM | POA: Diagnosis not present

## 2023-11-16 DIAGNOSIS — Z87440 Personal history of urinary (tract) infections: Secondary | ICD-10-CM | POA: Diagnosis not present

## 2023-11-16 DIAGNOSIS — Z87820 Personal history of traumatic brain injury: Secondary | ICD-10-CM | POA: Diagnosis not present

## 2023-11-16 DIAGNOSIS — I69391 Dysphagia following cerebral infarction: Secondary | ICD-10-CM | POA: Diagnosis not present

## 2023-11-16 DIAGNOSIS — Z9181 History of falling: Secondary | ICD-10-CM | POA: Diagnosis not present

## 2023-11-16 DIAGNOSIS — E785 Hyperlipidemia, unspecified: Secondary | ICD-10-CM | POA: Diagnosis not present

## 2023-11-16 DIAGNOSIS — N1832 Chronic kidney disease, stage 3b: Secondary | ICD-10-CM | POA: Diagnosis not present

## 2023-11-16 DIAGNOSIS — R1312 Dysphagia, oropharyngeal phase: Secondary | ICD-10-CM | POA: Diagnosis not present

## 2023-11-16 DIAGNOSIS — I700 Atherosclerosis of aorta: Secondary | ICD-10-CM | POA: Diagnosis not present

## 2023-11-16 DIAGNOSIS — I69322 Dysarthria following cerebral infarction: Secondary | ICD-10-CM | POA: Diagnosis not present

## 2023-11-16 DIAGNOSIS — Z604 Social exclusion and rejection: Secondary | ICD-10-CM | POA: Diagnosis not present

## 2023-11-16 DIAGNOSIS — I129 Hypertensive chronic kidney disease with stage 1 through stage 4 chronic kidney disease, or unspecified chronic kidney disease: Secondary | ICD-10-CM | POA: Diagnosis not present

## 2023-11-24 DIAGNOSIS — Z87820 Personal history of traumatic brain injury: Secondary | ICD-10-CM | POA: Diagnosis not present

## 2023-11-24 DIAGNOSIS — I700 Atherosclerosis of aorta: Secondary | ICD-10-CM | POA: Diagnosis not present

## 2023-11-24 DIAGNOSIS — N1832 Chronic kidney disease, stage 3b: Secondary | ICD-10-CM | POA: Diagnosis not present

## 2023-11-24 DIAGNOSIS — I129 Hypertensive chronic kidney disease with stage 1 through stage 4 chronic kidney disease, or unspecified chronic kidney disease: Secondary | ICD-10-CM | POA: Diagnosis not present

## 2023-11-24 DIAGNOSIS — G4733 Obstructive sleep apnea (adult) (pediatric): Secondary | ICD-10-CM | POA: Diagnosis not present

## 2023-11-24 DIAGNOSIS — E785 Hyperlipidemia, unspecified: Secondary | ICD-10-CM | POA: Diagnosis not present

## 2023-11-24 DIAGNOSIS — I69391 Dysphagia following cerebral infarction: Secondary | ICD-10-CM | POA: Diagnosis not present

## 2023-11-24 DIAGNOSIS — R1312 Dysphagia, oropharyngeal phase: Secondary | ICD-10-CM | POA: Diagnosis not present

## 2023-11-24 DIAGNOSIS — Z604 Social exclusion and rejection: Secondary | ICD-10-CM | POA: Diagnosis not present

## 2023-11-24 DIAGNOSIS — I69322 Dysarthria following cerebral infarction: Secondary | ICD-10-CM | POA: Diagnosis not present

## 2023-11-24 DIAGNOSIS — Z9181 History of falling: Secondary | ICD-10-CM | POA: Diagnosis not present

## 2023-11-24 DIAGNOSIS — I69351 Hemiplegia and hemiparesis following cerebral infarction affecting right dominant side: Secondary | ICD-10-CM | POA: Diagnosis not present

## 2023-11-24 DIAGNOSIS — Z7982 Long term (current) use of aspirin: Secondary | ICD-10-CM | POA: Diagnosis not present

## 2023-11-24 DIAGNOSIS — Z87440 Personal history of urinary (tract) infections: Secondary | ICD-10-CM | POA: Diagnosis not present

## 2023-11-25 DIAGNOSIS — I69322 Dysarthria following cerebral infarction: Secondary | ICD-10-CM | POA: Diagnosis not present

## 2023-11-25 DIAGNOSIS — I129 Hypertensive chronic kidney disease with stage 1 through stage 4 chronic kidney disease, or unspecified chronic kidney disease: Secondary | ICD-10-CM | POA: Diagnosis not present

## 2023-11-25 DIAGNOSIS — Z7982 Long term (current) use of aspirin: Secondary | ICD-10-CM | POA: Diagnosis not present

## 2023-11-25 DIAGNOSIS — I700 Atherosclerosis of aorta: Secondary | ICD-10-CM | POA: Diagnosis not present

## 2023-11-25 DIAGNOSIS — Z9181 History of falling: Secondary | ICD-10-CM | POA: Diagnosis not present

## 2023-11-25 DIAGNOSIS — I69351 Hemiplegia and hemiparesis following cerebral infarction affecting right dominant side: Secondary | ICD-10-CM | POA: Diagnosis not present

## 2023-11-25 DIAGNOSIS — I69391 Dysphagia following cerebral infarction: Secondary | ICD-10-CM | POA: Diagnosis not present

## 2023-11-25 DIAGNOSIS — N1832 Chronic kidney disease, stage 3b: Secondary | ICD-10-CM | POA: Diagnosis not present

## 2023-11-25 DIAGNOSIS — Z87820 Personal history of traumatic brain injury: Secondary | ICD-10-CM | POA: Diagnosis not present

## 2023-11-25 DIAGNOSIS — G4733 Obstructive sleep apnea (adult) (pediatric): Secondary | ICD-10-CM | POA: Diagnosis not present

## 2023-11-25 DIAGNOSIS — Z604 Social exclusion and rejection: Secondary | ICD-10-CM | POA: Diagnosis not present

## 2023-11-25 DIAGNOSIS — R1312 Dysphagia, oropharyngeal phase: Secondary | ICD-10-CM | POA: Diagnosis not present

## 2023-11-25 DIAGNOSIS — Z87440 Personal history of urinary (tract) infections: Secondary | ICD-10-CM | POA: Diagnosis not present

## 2023-11-25 DIAGNOSIS — E785 Hyperlipidemia, unspecified: Secondary | ICD-10-CM | POA: Diagnosis not present

## 2023-11-30 DIAGNOSIS — I700 Atherosclerosis of aorta: Secondary | ICD-10-CM | POA: Diagnosis not present

## 2023-11-30 DIAGNOSIS — E785 Hyperlipidemia, unspecified: Secondary | ICD-10-CM | POA: Diagnosis not present

## 2023-11-30 DIAGNOSIS — Z7982 Long term (current) use of aspirin: Secondary | ICD-10-CM | POA: Diagnosis not present

## 2023-11-30 DIAGNOSIS — Z87440 Personal history of urinary (tract) infections: Secondary | ICD-10-CM | POA: Diagnosis not present

## 2023-11-30 DIAGNOSIS — I129 Hypertensive chronic kidney disease with stage 1 through stage 4 chronic kidney disease, or unspecified chronic kidney disease: Secondary | ICD-10-CM | POA: Diagnosis not present

## 2023-11-30 DIAGNOSIS — N1832 Chronic kidney disease, stage 3b: Secondary | ICD-10-CM | POA: Diagnosis not present

## 2023-11-30 DIAGNOSIS — I69351 Hemiplegia and hemiparesis following cerebral infarction affecting right dominant side: Secondary | ICD-10-CM | POA: Diagnosis not present

## 2023-11-30 DIAGNOSIS — G4733 Obstructive sleep apnea (adult) (pediatric): Secondary | ICD-10-CM | POA: Diagnosis not present

## 2023-11-30 DIAGNOSIS — Z87820 Personal history of traumatic brain injury: Secondary | ICD-10-CM | POA: Diagnosis not present

## 2023-11-30 DIAGNOSIS — I69322 Dysarthria following cerebral infarction: Secondary | ICD-10-CM | POA: Diagnosis not present

## 2023-11-30 DIAGNOSIS — R1312 Dysphagia, oropharyngeal phase: Secondary | ICD-10-CM | POA: Diagnosis not present

## 2023-11-30 DIAGNOSIS — Z9181 History of falling: Secondary | ICD-10-CM | POA: Diagnosis not present

## 2023-11-30 DIAGNOSIS — Z604 Social exclusion and rejection: Secondary | ICD-10-CM | POA: Diagnosis not present

## 2023-11-30 DIAGNOSIS — I69391 Dysphagia following cerebral infarction: Secondary | ICD-10-CM | POA: Diagnosis not present

## 2023-12-01 DIAGNOSIS — R8271 Bacteriuria: Secondary | ICD-10-CM | POA: Diagnosis not present

## 2023-12-01 DIAGNOSIS — N3020 Other chronic cystitis without hematuria: Secondary | ICD-10-CM | POA: Diagnosis not present

## 2023-12-07 DIAGNOSIS — Z7982 Long term (current) use of aspirin: Secondary | ICD-10-CM | POA: Diagnosis not present

## 2023-12-07 DIAGNOSIS — Z9181 History of falling: Secondary | ICD-10-CM | POA: Diagnosis not present

## 2023-12-07 DIAGNOSIS — R1312 Dysphagia, oropharyngeal phase: Secondary | ICD-10-CM | POA: Diagnosis not present

## 2023-12-07 DIAGNOSIS — Z604 Social exclusion and rejection: Secondary | ICD-10-CM | POA: Diagnosis not present

## 2023-12-07 DIAGNOSIS — N1832 Chronic kidney disease, stage 3b: Secondary | ICD-10-CM | POA: Diagnosis not present

## 2023-12-07 DIAGNOSIS — Z87820 Personal history of traumatic brain injury: Secondary | ICD-10-CM | POA: Diagnosis not present

## 2023-12-07 DIAGNOSIS — I69351 Hemiplegia and hemiparesis following cerebral infarction affecting right dominant side: Secondary | ICD-10-CM | POA: Diagnosis not present

## 2023-12-07 DIAGNOSIS — I700 Atherosclerosis of aorta: Secondary | ICD-10-CM | POA: Diagnosis not present

## 2023-12-07 DIAGNOSIS — I129 Hypertensive chronic kidney disease with stage 1 through stage 4 chronic kidney disease, or unspecified chronic kidney disease: Secondary | ICD-10-CM | POA: Diagnosis not present

## 2023-12-07 DIAGNOSIS — Z87440 Personal history of urinary (tract) infections: Secondary | ICD-10-CM | POA: Diagnosis not present

## 2023-12-07 DIAGNOSIS — I69391 Dysphagia following cerebral infarction: Secondary | ICD-10-CM | POA: Diagnosis not present

## 2023-12-07 DIAGNOSIS — E785 Hyperlipidemia, unspecified: Secondary | ICD-10-CM | POA: Diagnosis not present

## 2023-12-07 DIAGNOSIS — G4733 Obstructive sleep apnea (adult) (pediatric): Secondary | ICD-10-CM | POA: Diagnosis not present

## 2023-12-07 DIAGNOSIS — I69322 Dysarthria following cerebral infarction: Secondary | ICD-10-CM | POA: Diagnosis not present

## 2023-12-21 DIAGNOSIS — I69322 Dysarthria following cerebral infarction: Secondary | ICD-10-CM | POA: Diagnosis not present

## 2023-12-21 DIAGNOSIS — G4733 Obstructive sleep apnea (adult) (pediatric): Secondary | ICD-10-CM | POA: Diagnosis not present

## 2023-12-21 DIAGNOSIS — Z604 Social exclusion and rejection: Secondary | ICD-10-CM | POA: Diagnosis not present

## 2023-12-21 DIAGNOSIS — I700 Atherosclerosis of aorta: Secondary | ICD-10-CM | POA: Diagnosis not present

## 2023-12-21 DIAGNOSIS — R1312 Dysphagia, oropharyngeal phase: Secondary | ICD-10-CM | POA: Diagnosis not present

## 2023-12-21 DIAGNOSIS — Z87440 Personal history of urinary (tract) infections: Secondary | ICD-10-CM | POA: Diagnosis not present

## 2023-12-21 DIAGNOSIS — Z9181 History of falling: Secondary | ICD-10-CM | POA: Diagnosis not present

## 2023-12-21 DIAGNOSIS — I129 Hypertensive chronic kidney disease with stage 1 through stage 4 chronic kidney disease, or unspecified chronic kidney disease: Secondary | ICD-10-CM | POA: Diagnosis not present

## 2023-12-21 DIAGNOSIS — E785 Hyperlipidemia, unspecified: Secondary | ICD-10-CM | POA: Diagnosis not present

## 2023-12-21 DIAGNOSIS — I69391 Dysphagia following cerebral infarction: Secondary | ICD-10-CM | POA: Diagnosis not present

## 2023-12-21 DIAGNOSIS — Z7982 Long term (current) use of aspirin: Secondary | ICD-10-CM | POA: Diagnosis not present

## 2023-12-21 DIAGNOSIS — N1832 Chronic kidney disease, stage 3b: Secondary | ICD-10-CM | POA: Diagnosis not present

## 2023-12-21 DIAGNOSIS — I69351 Hemiplegia and hemiparesis following cerebral infarction affecting right dominant side: Secondary | ICD-10-CM | POA: Diagnosis not present

## 2023-12-21 DIAGNOSIS — Z87820 Personal history of traumatic brain injury: Secondary | ICD-10-CM | POA: Diagnosis not present

## 2023-12-28 DIAGNOSIS — R7303 Prediabetes: Secondary | ICD-10-CM | POA: Diagnosis not present

## 2023-12-28 DIAGNOSIS — I10 Essential (primary) hypertension: Secondary | ICD-10-CM | POA: Diagnosis not present

## 2023-12-28 DIAGNOSIS — I69359 Hemiplegia and hemiparesis following cerebral infarction affecting unspecified side: Secondary | ICD-10-CM | POA: Diagnosis not present

## 2023-12-28 DIAGNOSIS — E441 Mild protein-calorie malnutrition: Secondary | ICD-10-CM | POA: Diagnosis not present

## 2023-12-28 DIAGNOSIS — N1831 Chronic kidney disease, stage 3a: Secondary | ICD-10-CM | POA: Diagnosis not present

## 2023-12-28 DIAGNOSIS — N3020 Other chronic cystitis without hematuria: Secondary | ICD-10-CM | POA: Diagnosis not present

## 2023-12-29 DIAGNOSIS — Z604 Social exclusion and rejection: Secondary | ICD-10-CM | POA: Diagnosis not present

## 2023-12-29 DIAGNOSIS — I69322 Dysarthria following cerebral infarction: Secondary | ICD-10-CM | POA: Diagnosis not present

## 2023-12-29 DIAGNOSIS — Z7982 Long term (current) use of aspirin: Secondary | ICD-10-CM | POA: Diagnosis not present

## 2023-12-29 DIAGNOSIS — R1312 Dysphagia, oropharyngeal phase: Secondary | ICD-10-CM | POA: Diagnosis not present

## 2023-12-29 DIAGNOSIS — N1832 Chronic kidney disease, stage 3b: Secondary | ICD-10-CM | POA: Diagnosis not present

## 2023-12-29 DIAGNOSIS — I129 Hypertensive chronic kidney disease with stage 1 through stage 4 chronic kidney disease, or unspecified chronic kidney disease: Secondary | ICD-10-CM | POA: Diagnosis not present

## 2023-12-29 DIAGNOSIS — I700 Atherosclerosis of aorta: Secondary | ICD-10-CM | POA: Diagnosis not present

## 2023-12-29 DIAGNOSIS — Z87820 Personal history of traumatic brain injury: Secondary | ICD-10-CM | POA: Diagnosis not present

## 2023-12-29 DIAGNOSIS — I69391 Dysphagia following cerebral infarction: Secondary | ICD-10-CM | POA: Diagnosis not present

## 2023-12-29 DIAGNOSIS — E785 Hyperlipidemia, unspecified: Secondary | ICD-10-CM | POA: Diagnosis not present

## 2023-12-29 DIAGNOSIS — Z87440 Personal history of urinary (tract) infections: Secondary | ICD-10-CM | POA: Diagnosis not present

## 2023-12-29 DIAGNOSIS — G4733 Obstructive sleep apnea (adult) (pediatric): Secondary | ICD-10-CM | POA: Diagnosis not present

## 2023-12-29 DIAGNOSIS — I69351 Hemiplegia and hemiparesis following cerebral infarction affecting right dominant side: Secondary | ICD-10-CM | POA: Diagnosis not present

## 2023-12-29 DIAGNOSIS — Z9181 History of falling: Secondary | ICD-10-CM | POA: Diagnosis not present

## 2024-01-02 DIAGNOSIS — Z9181 History of falling: Secondary | ICD-10-CM | POA: Diagnosis not present

## 2024-01-02 DIAGNOSIS — I129 Hypertensive chronic kidney disease with stage 1 through stage 4 chronic kidney disease, or unspecified chronic kidney disease: Secondary | ICD-10-CM | POA: Diagnosis not present

## 2024-01-02 DIAGNOSIS — G4733 Obstructive sleep apnea (adult) (pediatric): Secondary | ICD-10-CM | POA: Diagnosis not present

## 2024-01-02 DIAGNOSIS — Z7982 Long term (current) use of aspirin: Secondary | ICD-10-CM | POA: Diagnosis not present

## 2024-01-02 DIAGNOSIS — I700 Atherosclerosis of aorta: Secondary | ICD-10-CM | POA: Diagnosis not present

## 2024-01-02 DIAGNOSIS — E785 Hyperlipidemia, unspecified: Secondary | ICD-10-CM | POA: Diagnosis not present

## 2024-01-02 DIAGNOSIS — R1312 Dysphagia, oropharyngeal phase: Secondary | ICD-10-CM | POA: Diagnosis not present

## 2024-01-02 DIAGNOSIS — Z87820 Personal history of traumatic brain injury: Secondary | ICD-10-CM | POA: Diagnosis not present

## 2024-01-02 DIAGNOSIS — I69391 Dysphagia following cerebral infarction: Secondary | ICD-10-CM | POA: Diagnosis not present

## 2024-01-02 DIAGNOSIS — Z87440 Personal history of urinary (tract) infections: Secondary | ICD-10-CM | POA: Diagnosis not present

## 2024-01-02 DIAGNOSIS — Z604 Social exclusion and rejection: Secondary | ICD-10-CM | POA: Diagnosis not present

## 2024-01-02 DIAGNOSIS — I69351 Hemiplegia and hemiparesis following cerebral infarction affecting right dominant side: Secondary | ICD-10-CM | POA: Diagnosis not present

## 2024-01-02 DIAGNOSIS — I69322 Dysarthria following cerebral infarction: Secondary | ICD-10-CM | POA: Diagnosis not present

## 2024-01-02 DIAGNOSIS — N1832 Chronic kidney disease, stage 3b: Secondary | ICD-10-CM | POA: Diagnosis not present

## 2024-01-19 DIAGNOSIS — N3020 Other chronic cystitis without hematuria: Secondary | ICD-10-CM | POA: Diagnosis not present

## 2024-04-02 DIAGNOSIS — N481 Balanitis: Secondary | ICD-10-CM | POA: Diagnosis not present

## 2024-04-02 DIAGNOSIS — N3020 Other chronic cystitis without hematuria: Secondary | ICD-10-CM | POA: Diagnosis not present

## 2024-04-05 DIAGNOSIS — N3020 Other chronic cystitis without hematuria: Secondary | ICD-10-CM | POA: Diagnosis not present

## 2024-04-05 DIAGNOSIS — N481 Balanitis: Secondary | ICD-10-CM | POA: Diagnosis not present

## 2024-04-17 DIAGNOSIS — N3020 Other chronic cystitis without hematuria: Secondary | ICD-10-CM | POA: Diagnosis not present

## 2024-07-02 DIAGNOSIS — G8191 Hemiplegia, unspecified affecting right dominant side: Secondary | ICD-10-CM | POA: Diagnosis not present

## 2024-07-02 DIAGNOSIS — R7303 Prediabetes: Secondary | ICD-10-CM | POA: Diagnosis not present

## 2024-07-02 DIAGNOSIS — E441 Mild protein-calorie malnutrition: Secondary | ICD-10-CM | POA: Diagnosis not present

## 2024-07-02 DIAGNOSIS — Z0000 Encounter for general adult medical examination without abnormal findings: Secondary | ICD-10-CM | POA: Diagnosis not present

## 2024-07-02 DIAGNOSIS — Z23 Encounter for immunization: Secondary | ICD-10-CM | POA: Diagnosis not present

## 2024-07-02 DIAGNOSIS — E559 Vitamin D deficiency, unspecified: Secondary | ICD-10-CM | POA: Diagnosis not present

## 2024-07-02 DIAGNOSIS — I10 Essential (primary) hypertension: Secondary | ICD-10-CM | POA: Diagnosis not present

## 2024-07-02 DIAGNOSIS — E782 Mixed hyperlipidemia: Secondary | ICD-10-CM | POA: Diagnosis not present

## 2024-07-02 DIAGNOSIS — N1831 Chronic kidney disease, stage 3a: Secondary | ICD-10-CM | POA: Diagnosis not present

## 2024-07-02 DIAGNOSIS — N3020 Other chronic cystitis without hematuria: Secondary | ICD-10-CM | POA: Diagnosis not present

## 2024-07-02 DIAGNOSIS — I69359 Hemiplegia and hemiparesis following cerebral infarction affecting unspecified side: Secondary | ICD-10-CM | POA: Diagnosis not present

## 2024-07-23 NOTE — Patient Instructions (Signed)
 Below is our plan:  We will continue current plan   Please make sure you are staying well hydrated. I recommend 50-60 ounces daily. Well balanced diet and regular exercise encouraged. Consistent sleep schedule with 6-8 hours recommended.   Please continue follow up with care team as directed.   Follow up with me in 1 year  You may receive a survey regarding today's visit. I encourage you to leave honest feed back as I do use this information to improve patient care. Thank you for seeing me today!   Management of Memory Problems   There are some general things you can do to help manage your memory problems.  Your memory may not in fact recover, but by using techniques and strategies you will be able to manage your memory difficulties better.   1)  Establish a routine. Try to establish and then stick to a regular routine.  By doing this, you will get used to what to expect and you will reduce the need to rely on your memory.  Also, try to do things at the same time of day, such as taking your medication or checking your calendar first thing in the morning. Think about think that you can do as a part of a regular routine and make a list.  Then enter them into a daily planner to remind you.  This will help you establish a routine.   2)  Organize your environment. Organize your environment so that it is uncluttered.  Decrease visual stimulation.  Place everyday items such as keys or cell phone in the same place every day (ie.  Basket next to front door) Use post it notes with a brief message to yourself (ie. Turn off light, lock the door) Use labels to indicate where things go (ie. Which cupboards are for food, dishes, etc.) Keep a notepad and pen by the telephone to take messages   3)  Memory Aids A diary or journal/notebook/daily planner Making a list (shopping list, chore list, to do list that needs to be done) Using an alarm as a reminder (kitchen timer or cell phone alarm) Using cell  phone to store information (Notes, Calendar, Reminders) Calendar/White board placed in a prominent position Post-it notes   In order for memory aids to be useful, you need to have good habits.  It's no good remembering to make a note in your journal if you don't remember to look in it.  Try setting aside a certain time of day to look in journal.   4)  Improving mood and managing fatigue. There may be other factors that contribute to memory difficulties.  Factors, such as anxiety, depression and tiredness can affect memory. Regular gentle exercise can help improve your mood and give you more energy. Exercise: there are short videos created by the [employer] on Health specially for older adults: https://bit.ly/2I30q97.  Mediterranean diet: which emphasizes fruits, vegetables, whole grains, legumes, fish, and other seafood; unsaturated fats such as olive oils; and low amounts of red meat, eggs, and sweets. A variation of this, called MIND (Mediterranean-DASH Intervention for Neurodegenerative Delay) incorporates the DASH (Dietary Approaches to Stop Hypertension) diet, which has been shown to lower high blood pressure, a risk factor for Alzheimer's disease. More information at: exitmarketing.de.  Aerobic exercise that improve heart health is also good for the mind.  [employer] on Aging have short videos for exercises that you can do at home: Blindworkshop.com.pt Simple relaxation techniques may help relieve symptoms of anxiety Try  to get back to completing activities or hobbies you enjoyed doing in the past. Learn to pace yourself through activities to decrease fatigue. Find out about some local support groups where you can share experiences with others. Try and achieve 7-8 hours of sleep at night.   Tasks to improve attention/working memory 1. Good sleep hygiene (7-8 hrs of sleep) 2. Learning a new skill  (Painting, Carpentry, Pottery, new language, Knitting). 3.Cognitive exercises (keep a daily journal, Puzzles) 4. Physical exercise and training  (30 min/day X 4 days week) 5. Being on Antidepressant if needed 6.Yoga, Meditation, Tai Chi 7. Decrease alcohol intake 8.Have a clear schedule and structure in daily routine   MIND Diet: The Mediterranean-DASH Diet Intervention for Neurodegenerative Delay, or MIND diet, targets the health of the aging brain. Research participants with the highest MIND diet scores had a significantly slower rate of cognitive decline compared with those with the lowest scores. The effects of the MIND diet on cognition showed greater effects than either the Mediterranean or the DASH diet alone.   The healthy items the MIND diet guidelines suggest include:   3+ servings a day of whole grains 1+ servings a day of vegetables (other than green leafy) 6+ servings a week of green leafy vegetables 5+ servings a week of nuts 4+ meals a week of beans 2+ servings a week of berries 2+ meals a week of poultry 1+ meals a week of fish Mainly olive oil if added fat is used   The unhealthy items, which are higher in saturated and trans fat, include: Less than 5 servings a week of pastries and sweets Less than 4 servings a week of red meat (including beef, pork, lamb, and products made from these meats) Less than one serving a week of cheese and fried foods Less than 1 tablespoon a day of butter/stick margarine

## 2024-07-23 NOTE — Progress Notes (Signed)
 Chief Complaint  Patient presents with   Follow-up    Pt in 6 with daughter Daughter states pt memory same Daughter states pt had hospital stay 12/2023 for UTI     HISTORY OF PRESENT ILLNESS:  07/30/24 ALL:  [name] returns for follow up for memory loss. He was last seen 04/2023 and doing well on memantine  10mg  BID and bupropion  150mg  daily. We discussed donepezil but he declined.   Since, he and his daughter feel symptoms are stable. Short term memory loss continues. He seems stable. Gait stable. He uses a walker with ambulation. Wheelchair for long distances. He is able to shower with minimal assistance. He has had a few falls getting into and out of the shower. Daughter fixes meals and prepares medication. He is not very active. He has difficulty with looking up and down s/p CVA. He used to like puzzles but has a hard time seeing. He usually goes to bed around 9:30. He may watch TV for about an hour then goes to sleep. He wakes around 11a. He does nap throughout the day. Mood is good. No behavioral concerns.   He continues atorvastatin  and asa. Followed regularly by PCP.   04/20/2023 ALL:  [name] returns for follow up for memory loss. He was last seen 04/2022 and doing well on memantine  10mg  BID and bupropion  150mg  daily. MMSE 23/30. Since, he reports doing well. [name] states memory is about the same. He has trouble with short term memory but it seems stable. She reports right sided weakness seems about the same. He is able to walk with walker. He has had three falls over the past three years, last fall was yesterday. Fortunately no injuries. He has not seen PT recently. He does not feel it was helpful in the past. They continue HEP at home. He seems happy. Sleeping well. He is followed regularly by PCP.   04/14/2022 ALL: [name] is a 83 y.o. male here today for follow up for OSA and memory loss. He was last seen 08/2020. He continues memantine  10mg  BID and bupropion  150mg  daily. He  presents with his daughter, Reda, who aids in the history. He lives with her and her husband. He feels memory is stable. He is able to shower and dress. He needs assistance with meal prep and medication management. Gait is stable. He has had 2-3 falls over the past 1.5 years. No injuries. He uses his walker at all times. In wheelchair, today. He has a good appetite. Sometimes he forgets to swallow food before taking the next bite. He would not use thickeners for water  but seems to be able to drink thin liquids without difficulty. He drinks at least 40 ounces of water  daily. He is sleeping well. Mood is good. He does not drive.   He continues regular follow up with PCP. Is is taking atorvastatin  and asa 81mg  daily. BP is normal. He was unable to tolerate CPAP.   09/02/2020 ALL: [name] is a 83 y.o. male here today for follow up for OSA and memory loss. He has continued Namenda  10mg  BID. He feels that he is doing fairly well. His daughter presents with him via Mychart and assists with visit. She agrees that he seems to be going quite well. Short term memory loss continues to be an issue but does not seem to have worsened significantly since last being seen. He is able to perform ADL's after being reminded. His daughter fixes meals and doses medicaitons. He  does not drive. He is followed closely by PCP for stroke prevention. He has not used CPAP in about 2 years. He could not tolerate therapy. He is not as active as he used to be. His daughter is working on getting him more active.   History (copied from Dr Dohmeier's previous note) [name] meets me today in a wheelchair. His daughter reports he was admitted to hospital 06-20-2019,  After a fall. He fractured his  good ' right leg and dislocated the hip.  He dislocated his right ring finger in the another fall, while in hospital- and the injury was not discovered until 24 hours later, also had a head injury and was scheduled to be transferred to  [facility]. His daughter intervened so that he stayed 48 hours and was stabilized.  He is now happy at [facility] and his med list is shrinking, he is getting few visits , due to need of social distancing.    REVIEW OF SYSTEMS: Out of a complete 14 system review of symptoms, the patient complains only of the following symptoms, memory loss, weakness, gait difficulty and all other reviewed systems are negative.   ALLERGIES: No Known Allergies   HOME MEDICATIONS: Outpatient Medications Prior to Visit  Medication Sig Dispense Refill   amLODipine  (NORVASC ) 5 MG tablet Take 5 mg by mouth in the morning.     atorvastatin  (LIPITOR) 10 MG tablet Take 10 mg by mouth every evening.     B Complex Vitamins (VITAMIN B COMPLEX PO) Take 1 tablet by mouth daily with breakfast.     BAYER LOW DOSE 81 MG tablet Take 81 mg by mouth daily. Swallow whole.     Cholecalciferol (VITAMIN D3) 1000 units CAPS Take 1,000 Units by mouth daily with breakfast.     DULoxetine  (CYMBALTA ) 60 MG capsule Take 60 mg by mouth every evening.     oxybutynin  (DITROPAN -XL) 10 MG 24 hr tablet Take 10 mg by mouth every evening.     tamsulosin  (FLOMAX ) 0.4 MG CAPS capsule Take 2 capsules (0.8 mg total) by mouth daily. 30 capsule 0   buPROPion  (WELLBUTRIN  XL) 150 MG 24 hr tablet TAKE 1 TABLET(150 MG) BY MOUTH DAILY (Patient taking differently: Take 150 mg by mouth in the morning.) 90 tablet 3   memantine  (NAMENDA ) 10 MG tablet Take 1 tablet (10 mg total) by mouth 2 (two) times daily. 180 tablet 3   MYRBETRIQ  50 MG TB24 tablet Take 50 mg by mouth daily. (Patient not taking: Reported on 07/30/2024)     No facility-administered medications prior to visit.     PAST MEDICAL HISTORY: Past Medical History:  Diagnosis Date   CKD (chronic kidney disease), stage II    CVA (cerebral infarction)    Left thalamic, right-sided weakness wih aphagia, right leg brace   Dyslipidemia    Elevated PSA    biopsy negative in 2008   GERD  (gastroesophageal reflux disease)    Hypertension    without medication since 2006   Major depression    Seasonal rhinitis      PAST SURGICAL HISTORY: Past Surgical History:  Procedure Laterality Date   HIP PINNING,CANNULATED Left 06/20/2019   Procedure: CANNULATED HIP PINNING;  Surgeon: Jerri Kay HERO, MD;  Location: [facility] OR;  Service: Orthopedics;  Laterality: Left;   NASAL SEPTUM SURGERY     VASECTOMY       FAMILY HISTORY: Family History  Problem Relation Age of Onset   Alzheimer's disease Father    Colon  cancer Brother      SOCIAL HISTORY: Social History   Socioeconomic History   Marital status: Widowed    Spouse name: [name]   Number of children: 2   Years of education: 12   Highest education level: Not on file  Occupational History   Not on file  Tobacco Use   Smoking status: Never   Smokeless tobacco: Never  Vaping Use   Vaping status: Never Used  Substance and Sexual Activity   Alcohol use: No   Drug use: No   Sexual activity: Not on file  Other Topics Concern   Not on file  Social History Narrative   Pt Lives with his Daughter   No Coffee    Social Drivers of Corporate Investment Banker Strain: Not on file  Food Insecurity: No Food Insecurity (05/29/2023)   Hunger Vital Sign    Worried About Running Out of Food in the Last Year: Never true    Ran Out of Food in the Last Year: Never true  Transportation Needs: No Transportation Needs (05/29/2023)   PRAPARE - Administrator, Civil Service (Medical): No    Lack of Transportation (Non-Medical): No  Physical Activity: Not on file  Stress: Not on file  Social Connections: Not on file  Intimate Partner Violence: Not At Risk (05/29/2023)   Humiliation, Afraid, Rape, and Kick questionnaire    Fear of Current or Ex-Partner: No    Emotionally Abused: No    Physically Abused: No    Sexually Abused: No     PHYSICAL EXAM  Vitals:   07/30/24 1300  BP: 94/61  Pulse: 92  Weight: 141 lb 12.8  oz (64.3 kg)  Height: 5' 10 (1.778 m)     Body mass index is 20.35 kg/m.  Generalized: Well developed, in no acute distress  Cardiology: normal rate and rhythm, no murmur auscultated  Respiratory: clear to auscultation bilaterally    Neurological examination  Mentation: Alert, he is not oriented to time but is oriented to place and most history taking. Follows all commands speech and language fluent Cranial nerve II-XII: Pupils were equal round reactive to light. Extraocular movements were full, visual field were full on confrontational test. Facial sensation and strength were normal. Uvula tongue midline. Head turning and shoulder shrug  were normal and symmetric. Motor: The motor testing reveals 5 over 5 strength of all 4 extremities. Very slight weakness noted of right upper ext. Good symmetric motor tone is noted throughout.  Sensory: Sensory testing is intact to soft touch on all 4 extremities. No evidence of extinction is noted.  Coordination: Cerebellar testing reveals good finger-nose-finger on left, ataxia noted of right upper ext.  Gait and station: Gait not assessed as he is in a wheelchair and did not bring assistive device.    DIAGNOSTIC DATA (LABS, IMAGING, TESTING) - I reviewed patient records, labs, notes, testing and imaging myself where available.  Lab Results  Component Value Date   WBC 7.2 06/02/2023   HGB 12.5 (L) 06/02/2023   HCT 39.2 06/02/2023   MCV 97.3 06/02/2023   PLT 260 06/02/2023      Component Value Date/Time   NA 135 06/02/2023 0433   K 3.7 06/02/2023 0433   CL 103 06/02/2023 0433   CO2 24 06/02/2023 0433   GLUCOSE 100 (H) 06/02/2023 0433   BUN 16 06/02/2023 0433   CREATININE 1.18 06/02/2023 0433   CALCIUM  8.5 (L) 06/02/2023 0433   PROT 6.5 05/29/2023 1430  ALBUMIN 3.4 (L) 05/29/2023 1430   AST 36 05/29/2023 1430   ALT 23 05/29/2023 1430   ALKPHOS 80 05/29/2023 1430   BILITOT 1.6 (H) 05/29/2023 1430   GFRNONAA >60 06/02/2023 0433    GFRAA 55 (L) 06/22/2019 0258   No results found for: CHOL, HDL, LDLCALC, LDLDIRECT, TRIG, CHOLHDL No results found for: YHAJ8R No results found for: VITAMINB12 No results found for: TSH     07/30/2024    1:03 PM 04/14/2022    9:52 AM 07/04/2019    4:04 PM  MMSE - Mini Mental State Exam  Orientation to time 2 1 3   Orientation to Place 5 5 5   Registration 3 3 3   Attention/ Calculation 1 3 1   Recall 3 3 3   Language- name 2 objects 2 2 2   Language- repeat 1 1 1   Language- follow 3 step command 3 3 3   Language- read & follow direction 1 1 1   Write a sentence 0 1 0  Copy design 0 0 0  Total score 21 23 22         09/13/2017   10:54 AM 05/31/2014   10:47 AM  Montreal Cognitive Assessment   Visuospatial/ Executive (0/5) 2 3  Naming (0/3) 3 3  Attention: Read list of digits (0/2) 2 2  Attention: Read list of letters (0/1) 1 1  Attention: Serial 7 subtraction starting at 100 (0/3) 2 3  Language: Repeat phrase (0/2) 1 2  Language : Fluency (0/1) 0 1  Abstraction (0/2) 2 2  Delayed Recall (0/5) 1 0  Orientation (0/6) 5 4  Total 19 21     ASSESSMENT AND PLAN  83 y.o. year old male  has a past medical history of CKD (chronic kidney disease), stage II, CVA (cerebral infarction), Dyslipidemia, Elevated PSA, GERD (gastroesophageal reflux disease), Hypertension, Major depression, and Seasonal rhinitis. 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 Memory is stable and mood is good. We will continue memantine  10mg  twice daily and bupropion  150mg  daily. Discussed donepezil but patient and daughter decline at this time. He was encouraged to continue regular physical and mental stimulation. Memory compensation strategies provided. Stroke prevention discussed and education provided. Healthy lifestyle habits encouraged.  He will follow up with PCP as directed. May continue refills with PCP. He will return to see me as needed. He and his daughter verbalize understanding and agreement with this plan.   No orders of the defined types were placed in this encounter.    Meds ordered this encounter  Medications   buPROPion  (WELLBUTRIN  XL) 150 MG 24 hr tablet    Sig: TAKE 1 TABLET(150 MG) BY MOUTH DAILY    Dispense:  90 tablet    Refill:  3    Supervising Provider:   YAN, YIJUN [[id number]]   memantine  (NAMENDA ) 10 MG tablet    Sig: Take 1 tablet (10 mg total) by mouth 2 (two) times daily.    Dispense:  180 tablet    Refill:  3    Supervising Provider:   YAN, YIJUN [[id number]]    I spent 30 minutes of face-to-face and non-face-to-face time with patient.  This included previsit chart review, lab review, study review, order entry, electronic health record documentation, patient education.   Greig Forbes, MSN, FNP-C 07/30/2024, 1:41 PM  [facility] [address]  [address] [place], [place] [address] [phone]

## 2024-07-30 ENCOUNTER — Ambulatory Visit: Admitting: Family Medicine

## 2024-07-30 ENCOUNTER — Encounter: Payer: Self-pay | Admitting: Family Medicine

## 2024-07-30 VITALS — BP 94/61 | HR 92 | Ht 70.0 in | Wt 141.8 lb

## 2024-07-30 DIAGNOSIS — I639 Cerebral infarction, unspecified: Secondary | ICD-10-CM | POA: Diagnosis not present

## 2024-07-30 DIAGNOSIS — F09 Unspecified mental disorder due to known physiological condition: Secondary | ICD-10-CM | POA: Diagnosis not present

## 2024-07-30 DIAGNOSIS — F0393 Unspecified dementia, unspecified severity, with mood disturbance: Secondary | ICD-10-CM

## 2024-07-30 MED ORDER — BUPROPION HCL ER (XL) 150 MG PO TB24: ORAL_TABLET | ORAL | 3 refills | Status: AC

## 2024-07-30 MED ORDER — MEMANTINE HCL 10 MG PO TABS: 10.0000 mg | ORAL_TABLET | Freq: Two times a day (BID) | ORAL | 3 refills | Status: AC

## 2025-08-19 ENCOUNTER — Ambulatory Visit: Admitting: Family Medicine
